# Patient Record
Sex: Male | Born: 1935 | Race: White | Hispanic: No | Smoking: Former smoker
Health system: Southern US, Community
[De-identification: ages and names within clinical notes are randomized; demographics above are authoritative.]

## PROBLEM LIST (undated history)

## (undated) DIAGNOSIS — M4310 Spondylolisthesis, site unspecified: Secondary | ICD-10-CM

## (undated) DIAGNOSIS — C61 Malignant neoplasm of prostate: Secondary | ICD-10-CM

## (undated) DIAGNOSIS — M1990 Unspecified osteoarthritis, unspecified site: Secondary | ICD-10-CM

## (undated) DIAGNOSIS — K635 Polyp of colon: Secondary | ICD-10-CM

## (undated) DIAGNOSIS — T883XXA Malignant hyperthermia due to anesthesia, initial encounter: Secondary | ICD-10-CM

## (undated) DIAGNOSIS — D3500 Benign neoplasm of unspecified adrenal gland: Secondary | ICD-10-CM

## (undated) DIAGNOSIS — K2970 Gastritis, unspecified, without bleeding: Principal | ICD-10-CM

## (undated) DIAGNOSIS — N400 Enlarged prostate without lower urinary tract symptoms: Secondary | ICD-10-CM

## (undated) DIAGNOSIS — R011 Cardiac murmur, unspecified: Secondary | ICD-10-CM

## (undated) DIAGNOSIS — I10 Essential (primary) hypertension: Secondary | ICD-10-CM

## (undated) DIAGNOSIS — I82409 Acute embolism and thrombosis of unspecified deep veins of unspecified lower extremity: Secondary | ICD-10-CM

## (undated) DIAGNOSIS — B9681 Helicobacter pylori [H. pylori] as the cause of diseases classified elsewhere: Principal | ICD-10-CM

## (undated) HISTORY — DX: Acute embolism and thrombosis of unspecified deep veins of unspecified lower extremity: I82.409

## (undated) HISTORY — DX: Malignant neoplasm of prostate: C61

## (undated) HISTORY — DX: Benign prostatic hyperplasia without lower urinary tract symptoms: N40.0

## (undated) HISTORY — DX: Polyp of colon: K63.5

## (undated) HISTORY — DX: Essential (primary) hypertension: I10

## (undated) HISTORY — DX: Malignant hyperthermia due to anesthesia, initial encounter: T88.3XXA

## (undated) HISTORY — DX: Benign neoplasm of unspecified adrenal gland: D35.00

## (undated) HISTORY — DX: Gastritis, unspecified, without bleeding: K29.70

## (undated) HISTORY — DX: Helicobacter pylori (H. pylori) as the cause of diseases classified elsewhere: B96.81

---
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
## 1977-10-29 HISTORY — PX: BACK SURGERY: SHX140

## 1994-10-29 HISTORY — PX: OTHER SURGICAL HISTORY: SHX169

## 1994-10-29 HISTORY — PX: KNEE SURGERY: SHX244

## 1995-10-30 HISTORY — PX: TRANSURETHRAL RESECTION OF PROSTATE: SHX73

## 2001-11-10 ENCOUNTER — Ambulatory Visit (HOSPITAL_COMMUNITY): Admission: RE | Admit: 2001-11-10 | Discharge: 2001-11-10 | Payer: Self-pay | Admitting: Urology

## 2001-11-10 ENCOUNTER — Encounter: Payer: Self-pay | Admitting: Urology

## 2002-07-28 ENCOUNTER — Ambulatory Visit (HOSPITAL_COMMUNITY): Admission: RE | Admit: 2002-07-28 | Discharge: 2002-07-28 | Payer: Self-pay | Admitting: Orthopaedic Surgery

## 2002-07-28 ENCOUNTER — Encounter: Payer: Self-pay | Admitting: Orthopaedic Surgery

## 2005-06-11 ENCOUNTER — Ambulatory Visit (HOSPITAL_COMMUNITY): Admission: RE | Admit: 2005-06-11 | Discharge: 2005-06-11 | Payer: Self-pay | Admitting: Orthopaedic Surgery

## 2005-10-29 HISTORY — PX: CORNEAL TRANSPLANT: SHX108

## 2005-10-29 HISTORY — PX: CATARACT EXTRACTION: SUR2

## 2006-06-07 ENCOUNTER — Ambulatory Visit (HOSPITAL_COMMUNITY): Admission: RE | Admit: 2006-06-07 | Discharge: 2006-06-07 | Payer: Self-pay | Admitting: Family Medicine

## 2007-04-24 ENCOUNTER — Ambulatory Visit: Admission: RE | Admit: 2007-04-24 | Discharge: 2007-07-23 | Payer: Self-pay | Admitting: Radiation Oncology

## 2007-06-19 ENCOUNTER — Encounter: Admission: RE | Admit: 2007-06-19 | Discharge: 2007-06-19 | Payer: Self-pay | Admitting: Urology

## 2007-07-11 ENCOUNTER — Ambulatory Visit (HOSPITAL_BASED_OUTPATIENT_CLINIC_OR_DEPARTMENT_OTHER): Admission: RE | Admit: 2007-07-11 | Discharge: 2007-07-11 | Payer: Self-pay | Admitting: Urology

## 2007-08-08 ENCOUNTER — Ambulatory Visit: Admission: RE | Admit: 2007-08-08 | Discharge: 2007-09-02 | Payer: Self-pay | Admitting: Radiation Oncology

## 2007-08-08 DIAGNOSIS — C61 Malignant neoplasm of prostate: Secondary | ICD-10-CM

## 2007-08-08 HISTORY — DX: Malignant neoplasm of prostate: C61

## 2008-05-13 ENCOUNTER — Ambulatory Visit (HOSPITAL_COMMUNITY): Admission: RE | Admit: 2008-05-13 | Discharge: 2008-05-13 | Payer: Self-pay | Admitting: *Deleted

## 2008-09-26 ENCOUNTER — Emergency Department (HOSPITAL_COMMUNITY): Admission: EM | Admit: 2008-09-26 | Discharge: 2008-09-26 | Payer: Self-pay | Admitting: Emergency Medicine

## 2008-09-26 ENCOUNTER — Emergency Department (HOSPITAL_COMMUNITY): Admission: EM | Admit: 2008-09-26 | Discharge: 2008-09-26 | Payer: Self-pay | Admitting: Family Medicine

## 2009-10-01 ENCOUNTER — Emergency Department (HOSPITAL_COMMUNITY): Admission: EM | Admit: 2009-10-01 | Discharge: 2009-10-01 | Payer: Self-pay | Admitting: Emergency Medicine

## 2009-11-28 ENCOUNTER — Ambulatory Visit (HOSPITAL_COMMUNITY): Admission: RE | Admit: 2009-11-28 | Discharge: 2009-11-28 | Payer: Self-pay | Admitting: Orthopaedic Surgery

## 2010-10-29 HISTORY — PX: COLONOSCOPY: SHX174

## 2010-11-20 ENCOUNTER — Encounter: Payer: Self-pay | Admitting: *Deleted

## 2011-03-13 NOTE — Op Note (Signed)
NAME:  Tracy Pratt, Tracy Pratt                ACCOUNT NO.:  [id number]   MEDICAL RECORD NO.:  [id number]          PATIENT TYPE:  AMB   LOCATION:  [facility]                         FACILITY:  [facility]   PHYSICIAN:  Mark C. Vernie Ammons, M.D.  DATE OF BIRTH:  1930/11/13   DATE OF PROCEDURE:  07/11/2007  DATE OF DISCHARGE:                               OPERATIVE REPORT   PREOPERATIVE DIAGNOSIS:  Adenocarcinoma of the prostate.   POSTOPERATIVE DIAGNOSIS:  Adenocarcinoma of the prostate.   PROCEDURE:  I-125 seed implant.   SURGEON:  Mark C. Vernie Ammons, M.D.   RADIATION ONCOLOGIST:  Artist Pais. Kathrynn Running, M.D.   ANESTHESIA:  Spinal.   DRAIN:  16 French Foley catheter.   SPECIMENS:  None.   BLOOD LOSS:  None.   COMPLICATIONS:  None.   INDICATIONS:  The patient is a 77 year old white male with biopsy proven  adenocarcinoma of the prostate, Gleason 7 (3+4) and a significant  portion of cores in the right mid prostate.  It used was found in  another portion of the prostate, as well, and his PSA was 5.47.  We  discussed all treatment options and he elected to proceed with  radioactive seed implant.  He understands the risks, complications, and  alternatives.   DESCRIPTION OF OPERATION:  After informed consent, the patient was  brought to the major OR and placed on the table, administered general  anesthesia, and an unofficial time out was performed.  A rectal probe as  well as a rectal tube was placed in the rectum.  A 16-French Foley  catheter was placed in the bladder with dilute contrast filling the  balloon and Dr. Kathrynn Running then performed real time ultrasonographic  planning.   Once the planning phase of the procedure was complete, I proceeded to  place the radioactive seeds according to the plan.  A total of 22  needles were used to place 61 seeds.  This portion of the operation was  well tolerated with no complication.  I then removed the Foley catheter,  rectal tube, and rectal probe.  The genitalia  was then reprepped and a  17 French flexible cystoscope was then used to perform cystoscopy.   The urethra was noted to be normal down to the sphincter which appears  intact.  The prostatic urethra was resected from his prior TURP and on  the floor of the bladder was a single radioactive seed that was grasped  and extracted.  I then reinserted the scope and noted to second seed  protruding from the wall of the prostatic urethra at about the 5 o'clock  position on the left hand side.  This was grasped, as well, and removed.  I then reinserted the scope a final time noting no further seeds  identified within the prostatic urethra.  No active bleeding was noted.  The bladder was then fully inspected and noted to be free of any tumor,  stones or inflammatory lesions.  The ureteral orifices were normal  configuration and position and retroflexion of the scope revealed no  seeds protruding from the base of the prostate.  The cystoscope was removed and a new 16 French Foley catheter was  placed.  The patient was then taken to the recovery room to be observed.  He has a history of malignant hyperthermia but received no  succinylcholine or inhalation agents and, therefore, once he is observed  in the recovery room and no elevation of his temperature is noted, he  will be discharged home.  He has had prior several surgeries in the past  without difficulty and his wife will monitor his temperature at home.  She said she monitored it every 30 minutes after his last surgery and I  have recommended she do that again.  He will be given a prescription for  500 mg of Cipro that he will take t.i.d. for five days and Vicodin ES,  #34.  He will follow up in my office in past three weeks for recheck and  will remove his catheter at home tomorrow morning.      Mark C. Vernie Ammons, M.D.  Electronically Signed     MCO/MEDQ  D:  07/11/2007  T:  07/12/2007  Job:  [id number]

## 2011-07-31 LAB — URINE CULTURE: Colony Count: >=100000

## 2011-07-31 LAB — URINALYSIS, ROUTINE W REFLEX MICROSCOPIC
Glucose, UA: NEGATIVE mg/dL
Specific Gravity, Urine: 1.021 (ref 1.005–1.030)
Urobilinogen, UA: 1 mg/dL (ref 0.0–1.0)

## 2011-07-31 LAB — URINE MICROSCOPIC-ADD ON

## 2011-07-31 LAB — POCT URINALYSIS DIP (DEVICE)
Bilirubin Urine: NEGATIVE
Glucose, UA: NEGATIVE mg/dL
Ketones, ur: 15 mg/dL — AB
Nitrite: NEGATIVE
pH: 6 (ref 5.0–8.0)

## 2011-08-10 LAB — CBC
HCT: 46.6
Hemoglobin: 16.1
MCHC: 34.6
MCV: 89.7
Platelets: 171
RBC: 5.2
RDW: 13.7
WBC: 5.9

## 2011-08-10 LAB — COMPREHENSIVE METABOLIC PANEL
ALT: 22
AST: 20
Albumin: 3.9
Alkaline Phosphatase: 50
BUN: 8
CO2: 29
Calcium: 8.9
Chloride: 103
Creatinine, Ser: 1.2
GFR calc Af Amer: >60
GFR calc non Af Amer: 59 — ABNORMAL LOW
Glucose, Bld: 98
Potassium: 4.3
Sodium: 139
Total Bilirubin: 1.1
Total Protein: 6.5

## 2011-08-10 LAB — PROTIME-INR
INR: 1
Prothrombin Time: 12.9

## 2011-08-10 LAB — APTT: aPTT: 29

## 2011-10-15 ENCOUNTER — Telehealth: Payer: Self-pay | Admitting: *Deleted

## 2011-10-29 ENCOUNTER — Encounter: Payer: Self-pay | Admitting: *Deleted

## 2011-10-29 DIAGNOSIS — T883XXA Malignant hyperthermia due to anesthesia, initial encounter: Secondary | ICD-10-CM | POA: Insufficient documentation

## 2011-10-29 DIAGNOSIS — N400 Enlarged prostate without lower urinary tract symptoms: Secondary | ICD-10-CM | POA: Insufficient documentation

## 2011-10-29 DIAGNOSIS — D3500 Benign neoplasm of unspecified adrenal gland: Secondary | ICD-10-CM | POA: Insufficient documentation

## 2011-10-31 ENCOUNTER — Ambulatory Visit
Admission: RE | Admit: 2011-10-31 | Discharge: 2011-10-31 | Disposition: A | Discharge status: Home / Self Care | Payer: Medicare Other | Source: Ambulatory Visit | Attending: Radiation Oncology | Admitting: Radiation Oncology

## 2011-10-31 ENCOUNTER — Telehealth: Payer: Self-pay | Admitting: *Deleted

## 2011-10-31 ENCOUNTER — Encounter: Payer: Self-pay | Admitting: Radiation Oncology

## 2011-10-31 VITALS — BP 155/91 | HR 80 | Temp 97.6°F | Resp 20 | Wt 214.5 lb

## 2011-10-31 DIAGNOSIS — C61 Malignant neoplasm of prostate: Secondary | ICD-10-CM

## 2011-10-31 DIAGNOSIS — E278 Other specified disorders of adrenal gland: Secondary | ICD-10-CM | POA: Diagnosis not present

## 2011-10-31 DIAGNOSIS — M4310 Spondylolisthesis, site unspecified: Secondary | ICD-10-CM | POA: Insufficient documentation

## 2011-10-31 DIAGNOSIS — Z79899 Other long term (current) drug therapy: Secondary | ICD-10-CM | POA: Diagnosis not present

## 2011-10-31 HISTORY — DX: Spondylolisthesis, site unspecified: M43.10

## 2011-10-31 HISTORY — DX: Unspecified osteoarthritis, unspecified site: M19.90

## 2011-10-31 NOTE — Progress Notes (Signed)
Radiation Oncology         [phone]) [phone] ________________________________  Name: Tracy Pratt MRN: [id number]  Date: 10/31/2011  DOB: 01/30/1931       Follow-Up Visit Note  CC:  Colette Ribas, MD  Ihor Gully MD    Diagnosis:   78 year old gentleman with stage TI C. adenocarcinoma prostate with a Gleason score 3+4 and PSA of 5.47  Interval Since Last Radiation:  4 years  Narrative . . . . . . . . The patient returns today for follow-up.  His PSA has been relatively well controlled with a most recent value of 0.09. However, he did develop a bout of hematuria prompting CT scan of the abdomen and pelvis. The studies that showed a soft tissue density along the right retroperitoneum posteriorly and the report suggested that this could in fact reflect a metastatic deposit. Given the patient's low PSA level and absence of other primary malignancy, he's been referred back to our office today to discuss potential diagnostic options to further evaluate this abnormality.              ALLERGIES:  is allergic to desflurane; enflurane; halothane; isoflurane; nsaids; sevoflurane; and succinylcholine.  Meds. . . . . . . . . . . . Current Outpatient Prescriptions  Medication Sig Dispense Refill  . HYDROcodone-acetaminophen (NORCO) 5-325 MG per tablet Take 1 tablet by mouth daily as needed.        Marland Kitchen lisinopril (PRINIVIL,ZESTRIL) 5 MG tablet Take 10 mg by mouth daily.       Marland Kitchen loteprednol (LOTEMAX) 0.5 % ophthalmic suspension Place 1 drop into both eyes 4 (four) times daily.         Physical Findings. . .  weight is 214 lb 8 oz (97.297 kg). His oral temperature is 97.6 F (36.4 C). His blood pressure is 155/91 and his pulse is 80. His respiration is 20. Marland Kitchen  No significant changes.  Lab Findings . . . . .  Lab Results  Component Value Date   WBC 5.9 07/04/2007   HGB 16.1 07/04/2007   HCT 46.6 07/04/2007   MCV 89.7 07/04/2007   PLT 171 07/04/2007   X-Ray Findings. . . . abdominal and pelvic CT were  performed at [facility] under a separate medical record.  Because this was a separate medical record, it was not compared with his previous CT of the chest from 2009 or his previous abdominal and pelvic CT performed in 2003 Diagnostic report text  *RADIOLOGY REPORT*  Clinical Data: Prostate cancer, gross hematuria.  CT ABDOMEN AND PELVIS WITHOUT AND WITH CONTRAST  Technique: Multidetector CT imaging of the abdomen and pelvis was performed without contrast material in one or both body regions, followed by contrast material(s) and further sections in one or both body regions.  Contrast: 125 ml Omnipaque  Comparison: None available  Findings:  Renal: No nephrolithiasis or ureterolithiasis. Cortical phase imaging demonstrates no enhancing renal cortical lesion. Delayed pyelogram phase imaging demonstrates no filling defects within the collecting systems or ureters.  Lung bases are clear. No pericardial fluid. No focal hepatic lesion. The gallbladder, pancreas, spleen, and adrenal glands are normal. Thickening of the lateral of the left adrenal gland is similar to prior report. The stomach, small bowel, and colon are normal.  Abdominal aorta normal caliber. No retroperitoneal lymphadenopathy.  There is a retroperitoneal nodule posterior to the upper pole of the right kidney measuring 13 mm (image 25).  The prostate gland contains a  brachytherapy seeds. No evidence of filling defects within the bladder. No pelvic lymphadenopathy Review of bone windows demonstrates no aggressive osseous lesions.  IMPRESSION:  1.. No explanation for hematuria. 2. Nodule within the right retroperitoneum adjacent to Morison's pouch. Cannot exclude a metastatic implant. 3. Stable enlargement lateral limb of the left adrenal gland by report.     Impression . . . . . . . The patient appears to be experiencing excellent biochemical control of his prostate cancer. He had spontaneous hematuria  which has resolved but a CT study performed in evaluation for the source of hematuria he was found to have an incidental 1.3 cm soft tissue nodule the retroperitoneum of uncertain clinical significance. His most recent CT has not been compared with older CT scans. Prior to proceeding with any additional imaging workup or biopsy, I have asked that his medical records be combined in the PACS system to provide comparison with older scans. On in formal review, his chest CT from July of 2009 does appear to show the upper age of a density in the retroperitoneum in the same location.  Plan . . . . . . . . . . . . I have requested that radiology compare his older scans. I also requested that our physics staff retrieve his radiation planning CT from 2008 to determine whether that image study said shows the upper retroperitoneum for comparison. The patient will return to radiation oncology clinic in 2 weeks to discuss the findings of these additional investigations.  _____________________________________  Artist Pais Kathrynn Running, M.D.

## 2011-10-31 NOTE — Progress Notes (Signed)
Recon follow up right retroperitoneal nodule   Hx 07/11/07 1-125 SEED iMPLANT ,GLEASON 3+4=7,PSA=5.47=61 SEEDS  MARRIED,   prostate Cancer 04/01/07     MARRIED, Daughter is a Advice worker ,     Allergies: NSAIDS,BEE STINGS,HALOTHANE SOLN

## 2011-10-31 NOTE — Telephone Encounter (Signed)
Pt stated last office visit was either Oct or November of this year, said his last PSA=.09, called and spoke with JUdy at Dr. Margrett Rud office requesting both,she will fax over 3:39 PM

## 2011-11-15 ENCOUNTER — Encounter: Payer: Self-pay | Admitting: Radiation Oncology

## 2011-11-15 ENCOUNTER — Telehealth: Payer: Self-pay | Admitting: *Deleted

## 2011-11-15 ENCOUNTER — Ambulatory Visit
Admission: RE | Admit: 2011-11-15 | Discharge: 2011-11-15 | Disposition: A | Discharge status: Home / Self Care | Payer: Medicare Other | Source: Ambulatory Visit | Attending: Radiation Oncology | Admitting: Radiation Oncology

## 2011-11-15 DIAGNOSIS — C61 Malignant neoplasm of prostate: Secondary | ICD-10-CM

## 2011-11-15 DIAGNOSIS — D3500 Benign neoplasm of unspecified adrenal gland: Secondary | ICD-10-CM

## 2011-11-15 NOTE — Progress Notes (Signed)
Pt denies any urinary issues. Pt here to fu w/dr about previous scan results.

## 2011-11-16 ENCOUNTER — Other Ambulatory Visit: Payer: Self-pay | Admitting: Radiation Oncology

## 2011-11-16 ENCOUNTER — Encounter: Payer: Self-pay | Admitting: Radiation Oncology

## 2011-11-16 DIAGNOSIS — C61 Malignant neoplasm of prostate: Secondary | ICD-10-CM

## 2011-11-16 NOTE — Progress Notes (Signed)
  Radiation Oncology         [phone] ________________________________  Name: Tracy Pratt MRN: [id number]  Date: 11/16/2011  DOB: Mar 23, 1931       Follow-Up Visit Note  CC:  Colette Ribas, MD  Diagnosis:   78 year old gentleman status post radiation treatment for prostate cancer with biochemical control  Interval Since Last Radiation:  4 years  Narrative . . . . . . . . The patient returns today for routine follow-up.  He had a CT scan performed [facility] which showed a soft tissue density in the right retroperitoneum. We reviewed that scan during his last visit to our office on January 2. In combining his electronic medical records, patient appears to also had a CT scan of the chest a few years ago showing the same finding. During the patient's visit to our clinic on January 2, I told him that I felt that the new soft tissue lesion in the right retroperitoneum was actually seen on his previous chest CT suggestive of old scar tissue. However, I told him that I would review all available previous CTs and review his case with radiology. He returns today to review the results of that investigation. Our planning CT for his prostate radiation was pulled up and reviewed. It did not show images high enough to reveal the right upper abnormal soft tissue mass.    I also reviewed this case with Dr. Hulan Amato of diagnostic radiology. He kindly had the patient's electronic records combined in the radiology system and compared to the patient's current abdominal CT with his older chest CT. We will also discussed the patient's biochemical control for prostate cancer and I provided some clinical background about his current status. Dr. Amil Amen felt that the soft tissue lesion seen on the current abdominal CT was present on the patient's previous chest CT and likely represents a benign focus of scar tissue. It is worth noting that the chest CT shows the upper age of the soft tissue lesion  in the right abdomen and does not show it in its entirety, limiting its certainty.                            Impression . . . . . . . The patient currently exhibits no evidence of recurrence of his prostate cancer. He does have a small soft tissue mass in the right upper abdomen. This may reflect old trauma.  Plan . . . . . . . . . . . . At this point, for the patient's peace of mind, I elected to order a CT scan of the abdomen on February 11th to provide a 3 month interim followup and he'll return to radiation oncology clinic in her eating office on for more 13th to review those results. If the new CT also documents stability of the finding, will not order any additional scans.  _____________________________________  Artist Pais Kathrynn Running, M.D.

## 2011-11-17 DIAGNOSIS — Z6830 Body mass index (BMI) 30.0-30.9, adult: Secondary | ICD-10-CM | POA: Diagnosis not present

## 2011-11-17 DIAGNOSIS — J209 Acute bronchitis, unspecified: Secondary | ICD-10-CM | POA: Diagnosis not present

## 2011-11-17 DIAGNOSIS — J069 Acute upper respiratory infection, unspecified: Secondary | ICD-10-CM | POA: Diagnosis not present

## 2011-12-04 ENCOUNTER — Encounter (HOSPITAL_COMMUNITY): Payer: Self-pay

## 2011-12-04 ENCOUNTER — Ambulatory Visit (HOSPITAL_COMMUNITY)
Admission: RE | Admit: 2011-12-04 | Discharge: 2011-12-04 | Disposition: A | Discharge status: Home / Self Care | Payer: Medicare Other | Source: Ambulatory Visit | Attending: Radiation Oncology | Admitting: Radiation Oncology

## 2011-12-04 ENCOUNTER — Other Ambulatory Visit: Payer: Self-pay | Admitting: Radiation Oncology

## 2011-12-04 DIAGNOSIS — C61 Malignant neoplasm of prostate: Secondary | ICD-10-CM | POA: Diagnosis not present

## 2011-12-04 DIAGNOSIS — R935 Abnormal findings on diagnostic imaging of other abdominal regions, including retroperitoneum: Secondary | ICD-10-CM | POA: Insufficient documentation

## 2011-12-04 DIAGNOSIS — Z09 Encounter for follow-up examination after completed treatment for conditions other than malignant neoplasm: Secondary | ICD-10-CM | POA: Insufficient documentation

## 2011-12-04 DIAGNOSIS — M7989 Other specified soft tissue disorders: Secondary | ICD-10-CM | POA: Diagnosis not present

## 2011-12-04 DIAGNOSIS — E278 Other specified disorders of adrenal gland: Secondary | ICD-10-CM | POA: Diagnosis not present

## 2011-12-04 LAB — CREATININE, SERUM
GFR calc Af Amer: 71 mL/min — ABNORMAL LOW (ref >90)
GFR calc non Af Amer: 61 mL/min — ABNORMAL LOW (ref >90)

## 2011-12-04 LAB — BUN: BUN: 13 mg/dL (ref 6–23)

## 2011-12-04 MED ORDER — IOHEXOL 300 MG/ML  SOLN
100.0000 mL | Freq: Once | INTRAMUSCULAR | Status: AC | PRN
  Administered 2011-12-04: 100 mL via INTRAVENOUS

## 2011-12-05 ENCOUNTER — Telehealth: Payer: Self-pay | Admitting: *Deleted

## 2011-12-05 NOTE — Progress Notes (Signed)
Quick Note:  Please call patient with normal result.  Thanks. MM ______ 

## 2011-12-05 NOTE — Progress Notes (Signed)
Will call pt,Tracy Pratt

## 2011-12-05 NOTE — Telephone Encounter (Signed)
Called pt home phone, [phone],left voice message, then called pts cell phone, pt answered and gave results normal ct abdomen  Per MD, pt asked if he still needed to come to [facility] in February for appt, will call pt back on his home phone per his request and leave a message on this status  after e-mailing Dr. Iona Coach thanked this RN for the good news 9:40 AM

## 2011-12-06 ENCOUNTER — Other Ambulatory Visit (HOSPITAL_COMMUNITY): Payer: Medicare Other

## 2011-12-12 DIAGNOSIS — Z8546 Personal history of malignant neoplasm of prostate: Secondary | ICD-10-CM | POA: Diagnosis not present

## 2011-12-12 DIAGNOSIS — R229 Localized swelling, mass and lump, unspecified: Secondary | ICD-10-CM | POA: Diagnosis not present

## 2011-12-12 DIAGNOSIS — Z09 Encounter for follow-up examination after completed treatment for conditions other than malignant neoplasm: Secondary | ICD-10-CM | POA: Diagnosis not present

## 2011-12-21 NOTE — Telephone Encounter (Signed)
xxx

## 2012-02-15 DIAGNOSIS — R7301 Impaired fasting glucose: Secondary | ICD-10-CM | POA: Diagnosis not present

## 2012-02-15 DIAGNOSIS — Z6830 Body mass index (BMI) 30.0-30.9, adult: Secondary | ICD-10-CM | POA: Diagnosis not present

## 2012-02-15 DIAGNOSIS — I10 Essential (primary) hypertension: Secondary | ICD-10-CM | POA: Diagnosis not present

## 2012-02-15 DIAGNOSIS — E785 Hyperlipidemia, unspecified: Secondary | ICD-10-CM | POA: Diagnosis not present

## 2012-02-18 DIAGNOSIS — I10 Essential (primary) hypertension: Secondary | ICD-10-CM | POA: Diagnosis not present

## 2012-02-18 DIAGNOSIS — R7301 Impaired fasting glucose: Secondary | ICD-10-CM | POA: Diagnosis not present

## 2012-02-18 DIAGNOSIS — E785 Hyperlipidemia, unspecified: Secondary | ICD-10-CM | POA: Diagnosis not present

## 2012-02-19 DIAGNOSIS — I10 Essential (primary) hypertension: Secondary | ICD-10-CM | POA: Diagnosis not present

## 2012-02-19 DIAGNOSIS — J069 Acute upper respiratory infection, unspecified: Secondary | ICD-10-CM | POA: Diagnosis not present

## 2012-02-19 DIAGNOSIS — Z6830 Body mass index (BMI) 30.0-30.9, adult: Secondary | ICD-10-CM | POA: Diagnosis not present

## 2012-03-18 NOTE — Progress Notes (Signed)
Encounter addended by: Lowella Petties, RN on: 03/18/2012 10:43 AM<BR>     Documentation filed: Charges VN

## 2012-03-25 DIAGNOSIS — R82998 Other abnormal findings in urine: Secondary | ICD-10-CM | POA: Diagnosis not present

## 2012-03-25 DIAGNOSIS — N419 Inflammatory disease of prostate, unspecified: Secondary | ICD-10-CM | POA: Diagnosis not present

## 2012-03-25 DIAGNOSIS — Z8546 Personal history of malignant neoplasm of prostate: Secondary | ICD-10-CM | POA: Diagnosis not present

## 2012-03-25 DIAGNOSIS — R310 Gross hematuria: Secondary | ICD-10-CM | POA: Diagnosis not present

## 2012-05-13 DIAGNOSIS — Z8546 Personal history of malignant neoplasm of prostate: Secondary | ICD-10-CM | POA: Diagnosis not present

## 2012-07-21 DIAGNOSIS — M25559 Pain in unspecified hip: Secondary | ICD-10-CM | POA: Diagnosis not present

## 2012-08-04 DIAGNOSIS — M25559 Pain in unspecified hip: Secondary | ICD-10-CM | POA: Diagnosis not present

## 2012-08-26 DIAGNOSIS — Z961 Presence of intraocular lens: Secondary | ICD-10-CM | POA: Diagnosis not present

## 2012-08-26 DIAGNOSIS — Z947 Corneal transplant status: Secondary | ICD-10-CM | POA: Diagnosis not present

## 2012-10-15 DIAGNOSIS — M25569 Pain in unspecified knee: Secondary | ICD-10-CM | POA: Diagnosis not present

## 2012-11-10 DIAGNOSIS — I10 Essential (primary) hypertension: Secondary | ICD-10-CM | POA: Diagnosis not present

## 2012-11-10 DIAGNOSIS — M25569 Pain in unspecified knee: Secondary | ICD-10-CM | POA: Diagnosis not present

## 2012-12-02 ENCOUNTER — Other Ambulatory Visit (HOSPITAL_COMMUNITY): Payer: Self-pay | Admitting: Family Medicine

## 2012-12-02 DIAGNOSIS — Z0000 Encounter for general adult medical examination without abnormal findings: Secondary | ICD-10-CM | POA: Diagnosis not present

## 2012-12-02 DIAGNOSIS — E785 Hyperlipidemia, unspecified: Secondary | ICD-10-CM | POA: Diagnosis not present

## 2012-12-02 DIAGNOSIS — R7301 Impaired fasting glucose: Secondary | ICD-10-CM | POA: Diagnosis not present

## 2012-12-02 DIAGNOSIS — Z6841 Body Mass Index (BMI) 40.0 and over, adult: Secondary | ICD-10-CM | POA: Diagnosis not present

## 2012-12-04 ENCOUNTER — Other Ambulatory Visit (HOSPITAL_COMMUNITY): Payer: Medicare Other

## 2012-12-05 ENCOUNTER — Ambulatory Visit (HOSPITAL_COMMUNITY)
Admission: RE | Admit: 2012-12-05 | Discharge: 2012-12-05 | Disposition: A | Discharge status: Home / Self Care | Payer: Medicare Other | Source: Ambulatory Visit | Attending: Family Medicine | Admitting: Family Medicine

## 2012-12-05 DIAGNOSIS — I6529 Occlusion and stenosis of unspecified carotid artery: Secondary | ICD-10-CM | POA: Diagnosis not present

## 2012-12-05 DIAGNOSIS — Z0000 Encounter for general adult medical examination without abnormal findings: Secondary | ICD-10-CM

## 2012-12-05 DIAGNOSIS — R0989 Other specified symptoms and signs involving the circulatory and respiratory systems: Secondary | ICD-10-CM | POA: Diagnosis not present

## 2012-12-05 DIAGNOSIS — I10 Essential (primary) hypertension: Secondary | ICD-10-CM | POA: Diagnosis not present

## 2012-12-22 DIAGNOSIS — L570 Actinic keratosis: Secondary | ICD-10-CM | POA: Diagnosis not present

## 2013-03-20 ENCOUNTER — Other Ambulatory Visit (HOSPITAL_COMMUNITY): Payer: Self-pay | Admitting: Family Medicine

## 2013-03-20 DIAGNOSIS — R1011 Right upper quadrant pain: Secondary | ICD-10-CM | POA: Diagnosis not present

## 2013-03-20 DIAGNOSIS — R160 Hepatomegaly, not elsewhere classified: Secondary | ICD-10-CM | POA: Diagnosis not present

## 2013-03-20 DIAGNOSIS — Z6831 Body mass index (BMI) 31.0-31.9, adult: Secondary | ICD-10-CM | POA: Diagnosis not present

## 2013-03-25 ENCOUNTER — Ambulatory Visit (HOSPITAL_COMMUNITY)
Admission: RE | Admit: 2013-03-25 | Discharge: 2013-03-25 | Disposition: A | Discharge status: Home / Self Care | Payer: Medicare Other | Source: Ambulatory Visit | Attending: Family Medicine | Admitting: Family Medicine

## 2013-03-25 DIAGNOSIS — K4090 Unilateral inguinal hernia, without obstruction or gangrene, not specified as recurrent: Secondary | ICD-10-CM | POA: Insufficient documentation

## 2013-03-25 DIAGNOSIS — I10 Essential (primary) hypertension: Secondary | ICD-10-CM | POA: Insufficient documentation

## 2013-03-25 DIAGNOSIS — Z8546 Personal history of malignant neoplasm of prostate: Secondary | ICD-10-CM | POA: Insufficient documentation

## 2013-03-25 DIAGNOSIS — R160 Hepatomegaly, not elsewhere classified: Secondary | ICD-10-CM

## 2013-03-25 DIAGNOSIS — R1011 Right upper quadrant pain: Secondary | ICD-10-CM

## 2013-03-25 MED ORDER — IOHEXOL 300 MG/ML  SOLN
100.0000 mL | Freq: Once | INTRAMUSCULAR | Status: AC | PRN
  Administered 2013-03-25: 100 mL via INTRAVENOUS

## 2013-04-04 DIAGNOSIS — K4090 Unilateral inguinal hernia, without obstruction or gangrene, not specified as recurrent: Secondary | ICD-10-CM | POA: Diagnosis not present

## 2013-04-04 DIAGNOSIS — I10 Essential (primary) hypertension: Secondary | ICD-10-CM | POA: Diagnosis not present

## 2013-04-04 DIAGNOSIS — Z6831 Body mass index (BMI) 31.0-31.9, adult: Secondary | ICD-10-CM | POA: Diagnosis not present

## 2013-04-13 ENCOUNTER — Ambulatory Visit (INDEPENDENT_AMBULATORY_CARE_PROVIDER_SITE_OTHER): Payer: Medicare Other | Admitting: Surgery

## 2013-05-19 DIAGNOSIS — N401 Enlarged prostate with lower urinary tract symptoms: Secondary | ICD-10-CM | POA: Diagnosis not present

## 2013-05-19 DIAGNOSIS — Z8546 Personal history of malignant neoplasm of prostate: Secondary | ICD-10-CM | POA: Diagnosis not present

## 2013-06-04 DIAGNOSIS — J019 Acute sinusitis, unspecified: Secondary | ICD-10-CM | POA: Diagnosis not present

## 2013-06-04 DIAGNOSIS — Z6831 Body mass index (BMI) 31.0-31.9, adult: Secondary | ICD-10-CM | POA: Diagnosis not present

## 2013-06-04 DIAGNOSIS — I10 Essential (primary) hypertension: Secondary | ICD-10-CM | POA: Diagnosis not present

## 2013-07-23 DIAGNOSIS — Z23 Encounter for immunization: Secondary | ICD-10-CM | POA: Diagnosis not present

## 2013-07-27 DIAGNOSIS — M25519 Pain in unspecified shoulder: Secondary | ICD-10-CM | POA: Diagnosis not present

## 2013-07-27 DIAGNOSIS — I10 Essential (primary) hypertension: Secondary | ICD-10-CM | POA: Diagnosis not present

## 2013-07-27 DIAGNOSIS — M79609 Pain in unspecified limb: Secondary | ICD-10-CM | POA: Diagnosis not present

## 2013-08-24 DIAGNOSIS — M898X9 Other specified disorders of bone, unspecified site: Secondary | ICD-10-CM | POA: Diagnosis not present

## 2013-08-24 DIAGNOSIS — M79609 Pain in unspecified limb: Secondary | ICD-10-CM | POA: Diagnosis not present

## 2013-08-24 DIAGNOSIS — I10 Essential (primary) hypertension: Secondary | ICD-10-CM | POA: Diagnosis not present

## 2013-08-24 DIAGNOSIS — M25519 Pain in unspecified shoulder: Secondary | ICD-10-CM | POA: Diagnosis not present

## 2013-09-01 DIAGNOSIS — H0289 Other specified disorders of eyelid: Secondary | ICD-10-CM | POA: Diagnosis not present

## 2013-10-05 DIAGNOSIS — M25579 Pain in unspecified ankle and joints of unspecified foot: Secondary | ICD-10-CM | POA: Diagnosis not present

## 2013-10-05 DIAGNOSIS — M25559 Pain in unspecified hip: Secondary | ICD-10-CM | POA: Diagnosis not present

## 2013-10-05 DIAGNOSIS — M545 Low back pain: Secondary | ICD-10-CM | POA: Diagnosis not present

## 2013-10-05 DIAGNOSIS — I10 Essential (primary) hypertension: Secondary | ICD-10-CM | POA: Diagnosis not present

## 2013-10-05 DIAGNOSIS — M79609 Pain in unspecified limb: Secondary | ICD-10-CM | POA: Diagnosis not present

## 2013-10-05 DIAGNOSIS — M25519 Pain in unspecified shoulder: Secondary | ICD-10-CM | POA: Diagnosis not present

## 2013-10-09 DIAGNOSIS — S93609A Unspecified sprain of unspecified foot, initial encounter: Secondary | ICD-10-CM | POA: Diagnosis not present

## 2013-10-09 DIAGNOSIS — M25579 Pain in unspecified ankle and joints of unspecified foot: Secondary | ICD-10-CM | POA: Diagnosis not present

## 2013-10-09 DIAGNOSIS — I10 Essential (primary) hypertension: Secondary | ICD-10-CM | POA: Diagnosis not present

## 2013-10-13 ENCOUNTER — Other Ambulatory Visit (HOSPITAL_COMMUNITY): Payer: Self-pay | Admitting: Orthopaedic Surgery

## 2013-10-13 DIAGNOSIS — R52 Pain, unspecified: Secondary | ICD-10-CM

## 2013-10-13 DIAGNOSIS — I10 Essential (primary) hypertension: Secondary | ICD-10-CM | POA: Diagnosis not present

## 2013-10-13 DIAGNOSIS — M25579 Pain in unspecified ankle and joints of unspecified foot: Secondary | ICD-10-CM | POA: Diagnosis not present

## 2013-10-19 ENCOUNTER — Ambulatory Visit (HOSPITAL_COMMUNITY)
Admission: RE | Admit: 2013-10-19 | Discharge: 2013-10-19 | Disposition: A | Discharge status: Home / Self Care | Payer: Medicare Other | Source: Ambulatory Visit | Attending: Orthopaedic Surgery | Admitting: Orthopaedic Surgery

## 2013-10-19 DIAGNOSIS — M259 Joint disorder, unspecified: Secondary | ICD-10-CM | POA: Diagnosis not present

## 2013-10-19 DIAGNOSIS — M79609 Pain in unspecified limb: Secondary | ICD-10-CM | POA: Diagnosis not present

## 2013-10-19 DIAGNOSIS — M1710 Unilateral primary osteoarthritis, unspecified knee: Secondary | ICD-10-CM | POA: Diagnosis not present

## 2013-10-19 DIAGNOSIS — R52 Pain, unspecified: Secondary | ICD-10-CM

## 2013-10-20 DIAGNOSIS — I10 Essential (primary) hypertension: Secondary | ICD-10-CM | POA: Diagnosis not present

## 2013-10-20 DIAGNOSIS — M25579 Pain in unspecified ankle and joints of unspecified foot: Secondary | ICD-10-CM | POA: Diagnosis not present

## 2013-10-29 HISTORY — PX: ROTATOR CUFF REPAIR: SHX139

## 2013-10-30 DIAGNOSIS — I10 Essential (primary) hypertension: Secondary | ICD-10-CM | POA: Diagnosis not present

## 2013-10-30 DIAGNOSIS — J0100 Acute maxillary sinusitis, unspecified: Secondary | ICD-10-CM | POA: Diagnosis not present

## 2013-10-30 DIAGNOSIS — Z6831 Body mass index (BMI) 31.0-31.9, adult: Secondary | ICD-10-CM | POA: Diagnosis not present

## 2013-11-13 DIAGNOSIS — M25579 Pain in unspecified ankle and joints of unspecified foot: Secondary | ICD-10-CM | POA: Diagnosis not present

## 2013-11-13 DIAGNOSIS — M25519 Pain in unspecified shoulder: Secondary | ICD-10-CM | POA: Diagnosis not present

## 2013-11-13 DIAGNOSIS — I10 Essential (primary) hypertension: Secondary | ICD-10-CM | POA: Diagnosis not present

## 2013-12-25 ENCOUNTER — Other Ambulatory Visit (HOSPITAL_COMMUNITY): Payer: Self-pay | Admitting: Orthopaedic Surgery

## 2013-12-25 DIAGNOSIS — M25512 Pain in left shoulder: Secondary | ICD-10-CM

## 2013-12-25 DIAGNOSIS — M25579 Pain in unspecified ankle and joints of unspecified foot: Secondary | ICD-10-CM | POA: Diagnosis not present

## 2013-12-25 DIAGNOSIS — M25519 Pain in unspecified shoulder: Secondary | ICD-10-CM | POA: Diagnosis not present

## 2013-12-25 DIAGNOSIS — I10 Essential (primary) hypertension: Secondary | ICD-10-CM | POA: Diagnosis not present

## 2013-12-29 ENCOUNTER — Other Ambulatory Visit (HOSPITAL_COMMUNITY): Payer: Self-pay | Admitting: General Practice

## 2013-12-30 ENCOUNTER — Ambulatory Visit (HOSPITAL_COMMUNITY)
Admission: RE | Admit: 2013-12-30 | Discharge: 2013-12-30 | Disposition: A | Discharge status: Home / Self Care | Payer: Medicare Other | Source: Ambulatory Visit | Attending: Orthopaedic Surgery | Admitting: Orthopaedic Surgery

## 2013-12-30 DIAGNOSIS — M25419 Effusion, unspecified shoulder: Secondary | ICD-10-CM | POA: Diagnosis not present

## 2013-12-30 DIAGNOSIS — Y33XXXA Other specified events, undetermined intent, initial encounter: Secondary | ICD-10-CM | POA: Insufficient documentation

## 2013-12-30 DIAGNOSIS — S46819A Strain of other muscles, fascia and tendons at shoulder and upper arm level, unspecified arm, initial encounter: Secondary | ICD-10-CM | POA: Diagnosis not present

## 2013-12-30 DIAGNOSIS — M67919 Unspecified disorder of synovium and tendon, unspecified shoulder: Secondary | ICD-10-CM | POA: Insufficient documentation

## 2013-12-30 DIAGNOSIS — M19019 Primary osteoarthritis, unspecified shoulder: Secondary | ICD-10-CM | POA: Insufficient documentation

## 2013-12-30 DIAGNOSIS — M719 Bursopathy, unspecified: Secondary | ICD-10-CM | POA: Diagnosis not present

## 2013-12-30 DIAGNOSIS — M25512 Pain in left shoulder: Secondary | ICD-10-CM

## 2014-01-01 DIAGNOSIS — M25519 Pain in unspecified shoulder: Secondary | ICD-10-CM | POA: Diagnosis not present

## 2014-01-01 DIAGNOSIS — I10 Essential (primary) hypertension: Secondary | ICD-10-CM | POA: Diagnosis not present

## 2014-01-01 DIAGNOSIS — S43429A Sprain of unspecified rotator cuff capsule, initial encounter: Secondary | ICD-10-CM | POA: Diagnosis not present

## 2014-01-01 DIAGNOSIS — T883XXA Malignant hyperthermia due to anesthesia, initial encounter: Secondary | ICD-10-CM | POA: Diagnosis not present

## 2014-01-01 DIAGNOSIS — M19019 Primary osteoarthritis, unspecified shoulder: Secondary | ICD-10-CM | POA: Diagnosis not present

## 2014-02-09 DIAGNOSIS — M75100 Unspecified rotator cuff tear or rupture of unspecified shoulder, not specified as traumatic: Secondary | ICD-10-CM | POA: Insufficient documentation

## 2014-02-09 DIAGNOSIS — S43429A Sprain of unspecified rotator cuff capsule, initial encounter: Secondary | ICD-10-CM | POA: Diagnosis not present

## 2014-02-22 DIAGNOSIS — L820 Inflamed seborrheic keratosis: Secondary | ICD-10-CM | POA: Diagnosis not present

## 2014-02-22 DIAGNOSIS — C44211 Basal cell carcinoma of skin of unspecified ear and external auricular canal: Secondary | ICD-10-CM | POA: Diagnosis not present

## 2014-02-22 DIAGNOSIS — L570 Actinic keratosis: Secondary | ICD-10-CM | POA: Diagnosis not present

## 2014-02-23 DIAGNOSIS — I498 Other specified cardiac arrhythmias: Secondary | ICD-10-CM | POA: Diagnosis not present

## 2014-02-23 DIAGNOSIS — I10 Essential (primary) hypertension: Secondary | ICD-10-CM | POA: Insufficient documentation

## 2014-02-23 DIAGNOSIS — Z01810 Encounter for preprocedural cardiovascular examination: Secondary | ICD-10-CM | POA: Diagnosis not present

## 2014-03-02 DIAGNOSIS — Z87891 Personal history of nicotine dependence: Secondary | ICD-10-CM | POA: Diagnosis not present

## 2014-03-02 DIAGNOSIS — I10 Essential (primary) hypertension: Secondary | ICD-10-CM | POA: Diagnosis not present

## 2014-03-02 DIAGNOSIS — Z888 Allergy status to other drugs, medicaments and biological substances status: Secondary | ICD-10-CM | POA: Diagnosis not present

## 2014-03-02 DIAGNOSIS — G8918 Other acute postprocedural pain: Secondary | ICD-10-CM | POA: Diagnosis not present

## 2014-03-02 DIAGNOSIS — S43429A Sprain of unspecified rotator cuff capsule, initial encounter: Secondary | ICD-10-CM | POA: Diagnosis not present

## 2014-03-02 DIAGNOSIS — M25819 Other specified joint disorders, unspecified shoulder: Secondary | ICD-10-CM | POA: Diagnosis not present

## 2014-03-02 DIAGNOSIS — Z8546 Personal history of malignant neoplasm of prostate: Secondary | ICD-10-CM | POA: Diagnosis not present

## 2014-03-11 DIAGNOSIS — Z6831 Body mass index (BMI) 31.0-31.9, adult: Secondary | ICD-10-CM | POA: Diagnosis not present

## 2014-03-11 DIAGNOSIS — R609 Edema, unspecified: Secondary | ICD-10-CM | POA: Diagnosis not present

## 2014-03-16 DIAGNOSIS — Z4789 Encounter for other orthopedic aftercare: Secondary | ICD-10-CM | POA: Diagnosis not present

## 2014-03-23 ENCOUNTER — Ambulatory Visit (HOSPITAL_COMMUNITY)
Admission: RE | Admit: 2014-03-23 | Discharge: 2014-03-23 | Disposition: A | Discharge status: Home / Self Care | Payer: Medicare Other | Source: Ambulatory Visit | Attending: Orthopedic Surgery | Admitting: Orthopedic Surgery

## 2014-03-23 DIAGNOSIS — M25519 Pain in unspecified shoulder: Secondary | ICD-10-CM | POA: Insufficient documentation

## 2014-03-23 DIAGNOSIS — IMO0001 Reserved for inherently not codable concepts without codable children: Secondary | ICD-10-CM | POA: Insufficient documentation

## 2014-03-23 DIAGNOSIS — M25612 Stiffness of left shoulder, not elsewhere classified: Secondary | ICD-10-CM | POA: Insufficient documentation

## 2014-03-23 DIAGNOSIS — M25619 Stiffness of unspecified shoulder, not elsewhere classified: Secondary | ICD-10-CM | POA: Diagnosis not present

## 2014-03-23 DIAGNOSIS — M6281 Muscle weakness (generalized): Secondary | ICD-10-CM | POA: Diagnosis not present

## 2014-03-23 NOTE — Evaluation (Addendum)
Occupational Therapy Evaluation  Patient Details  Name: Tracy Pratt MRN: [id number] Date of Birth: 1931/09/24  Today's Date: 03/23/2014 Time: 6237-6283 OT Time Calculation (min): 40 min OT eval 1305-1310 5' MFR 1517-6160 15' Pt education [phone] 20'  Visit#: 1 of 24  Re-eval: 04/20/14  Assessment Diagnosis: s/p left rotator cuff tear repair Surgical Date: 03/02/14 Next MD Visit: 04/19/2014 - Dr. Eden Lathe Prior Therapy: 9 years ago. Rotator cuff pain  Authorization: Medicare  Authorization Time Period: before 10th visit  Authorization Visit#: 1 of 10   Past Medical History:  Past Medical History  Diagnosis Date  . BPH (benign prostatic hyperplasia)   . Malignant hyperthermia   . Adrenal adenoma     left  . Allergy   . Cataract 2007&2009    both   . Arthritis     back,hip  . Hypertension   . Spondylolisthesis     l5-s1  . Prostate cancer 08/08/2007    seed implant and external rad tx   Past Surgical History:  Past Surgical History  Procedure Laterality Date  . Transurethral resection of prostate  1997  . Cataract extraction  2007  . Corneal transplant  2007  . Knee surgery  1996    left knee arthoscopic  . Back surgery  1979  . Torn meniscus left knee  1996    Subjective Symptoms/Limitations Symptoms: S: I had therapy on this shoulder probably 9 years ago and then it wasn't bad enough to have surgery. Pertinent History: 03/02/14 Mr. Sharp had rotator cuff repair and bone spurs removed from left shoulder. Patient states that he had therapy approx. 9 years ago on shoulder when it wasn't bad enough to have surgery. Patient is to wear sling 24/7 and is allowed to remove for bathing and dressing. Dr. Kenton Kingfisher has referred patient to occupational therapy for evaluation and treatment,. Patient Stated Goals: To get back to playinig with grandkids and volunteering at food pantry. Pain Assessment Currently in Pain?: No/denies (3/10 when pain does  bother.)  Precautions/Restrictions  Precautions Precautions: Shoulder Type of Shoulder Precautions: PROM for 2 weeks, start AAROM on 04/06/14, AROM may begin on 04/20/14.   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 [employer] ADL/Vision/Perception ADL ADL Comments: getting dressed/ bathing, using left arm Dominant Hand: Right Vision - History Baseline Vision: No visual deficits  Cognition/Observation Cognition Overall Cognitive Status: Within Functional Limits for tasks assessed  Sensation/Coordination/Edema    Additional Assessments LUE PROM (degrees) Left Shoulder Flexion: 41 Degrees Left Shoulder ABduction: 71 Degrees Left Shoulder Internal Rotation: 75 Degrees Left Shoulder External Rotation: 10 Degrees LUE Strength LUE Overall Strength Comments: unable to assess strength due to protocol. Palpation Palpation: max fascial restrictions      Exercise/Treatments    Manual Therapy Manual Therapy: Myofascial release Myofascial Release: MFR and manual stretching to left upper arm, trapezius, and scapularis region to decrease fascial restrictions and increase joint mobility in a pain free zone.  Occupational Therapy Assessment and Plan OT Assessment and Plan Clinical Impression Statement: A: Patient is a 80 y/o male s/p left shoulder rotator cuff repair with increased pain,  fascial restrictions, and decreased joint mobility causing difficulty completing B/IADL and leisure tasks. Pt will benefit from skilled therapeutic  intervention in order to improve on the following deficits: Increased fascial restricitons;Pain;Impaired UE functional use;Decreased strength;Decreased range of motion Rehab Potential: Excellent OT Frequency: Min 2X/week OT Duration: 12 weeks OT Treatment/Interventions: Self-care/ADL training;Therapeutic exercise;Modalities;Therapeutic activities;Manual therapy;Patient/family education;DME and/or AE instruction OT Plan: P:Pt will benefit from skilled OT interventions in order to decrease pain, increase ROM, increase strength, and increase overall LUE functional use. Treatment Plan: PROM, AAROM, and AROM, MFR and manual stretching, scapular strengtheing and proximal stabilization, LUE general strengthening - with protocol    Goals Short Term Goals Time to Complete Short Term Goals: 6 weeks Short Term Goal 1: Patient will be educated on HEP. Short Term Goal 2: Patient will report a pain level of 3/10 with daily tasks. Short Term Goal 3: Patient will increase PROM to WNL to increase ability to get dressed. 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 Short Term Goal 5: Patient will decrease fascial restrictions to mod amount. Long Term Goals Time to Complete Long Term Goals: 12 weeks Long Term Goal 1: Patient will return to highest level of independence with all B/IADL and leisure activities.  Long Term Goal 2: Patient will report a pain score of 2/10 or less with daily tasks. 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 Long Term Goal 4: Patient will increase left shoulder strength to 4-/5 to increase ability to lift boxes of light food at food pantry. Long Term Goal 5: Patient will decrease amount of fascial restrictions in min amount.  Problem List Patient Active Problem List   Diagnosis Date Noted  . Pain in joint, shoulder  region 03/23/2014  . Muscle weakness (generalized) 03/23/2014  . Decreased range of motion of left shoulder 03/23/2014  . Prostate cancer 10/31/2011  . Spondylolisthesis   . BPH (benign prostatic hyperplasia)   . Malignant hyperthermia   . Adrenal adenoma     End of Session Activity Tolerance: Patient tolerated treatment well General Behavior During Therapy: WFL for tasks assessed/performed OT Plan of Care OT Home Exercise Plan: pendulum and towel exercises (flexion only) OT Patient Instructions: handout (scanned) Consulted and Agree with Plan of Care: Patient  GO Functional Assessment Tool Used: FOTO score: 0/100 (100% impaired) Functional Limitation: Carrying, moving and handling objects Carrying, Moving and Handling Objects Current Status (W4315): 100 percent impaired, limited or restricted Carrying, Moving and Handling Objects Goal Status (Q0086): At least 20 percent but less than 40 percent impaired, limited or restricted  [name], OTR/L,CBIS   03/23/2014, 2:48 PM  Physician Documentation Your signature is required to indicate approval of the treatment plan as stated above.  Please sign and either send electronically or make a copy of this report for your files and return this physician signed original.  Please mark one 1.__approve of plan  2. ___approve of plan with the following conditions.   ______________________________                                                          _____________________ Physician Signature  Date  

## 2014-03-25 ENCOUNTER — Ambulatory Visit (HOSPITAL_COMMUNITY)
Admission: RE | Admit: 2014-03-25 | Discharge: 2014-03-25 | Disposition: A | Discharge status: Home / Self Care | Payer: Medicare Other | Source: Ambulatory Visit | Attending: Family Medicine | Admitting: Family Medicine

## 2014-03-25 NOTE — Progress Notes (Signed)
Occupational Therapy Treatment Patient Details  Name: Tracy Pratt MRN: [id number] Date of Birth: July 09, 1931  Today's Date: 03/25/2014 Time: 1751-0258 OT Time Calculation (min): 45 min MFR 5277-8242 14' Therex 3536-1443 31'  Visit#: 2 of 24  Re-eval: 04/20/14    Authorization: Medicare  Authorization Time Period: before 10th visit  Authorization Visit#: 2 of 10  Subjective Symptoms/Limitations Symptoms: S: It feels good to have my shoulder just hang down. Pain Assessment Currently in Pain?: Yes (with movement) Pain Score: 3  Pain Location: Shoulder Pain Orientation: Left Pain Type: Acute pain  Precautions/Restrictions  Precautions Precautions: Shoulder Type of Shoulder Precautions: PROM for 2 weeks, start AAROM on 04/06/14, AROM may begin on 04/20/14.   Shoulder Interventions: Shoulder sling/immobilizer  Exercise/Treatments Supine Protraction: PROM;10 reps Horizontal ABduction: PROM;10 reps External Rotation: PROM;10 reps Internal Rotation: PROM;10 reps Flexion: PROM;10 reps ABduction: PROM;10 reps Other Supine Exercises: 10X elbow flexion/extension, pronation/supination, wrist flexion/extension Seated Elevation: AROM;15 reps Extension: AROM;15 reps Row: AROM;15 reps Therapy Ball Flexion: 15 reps ABduction: 15 reps     Manual Therapy Manual Therapy: Myofascial release Myofascial Release: MFR and manual stretching to left upper arm, trapezius, and scapularis region to decrease fascial restrictions and increase joint mobility in a pain free zone.  Occupational Therapy Assessment and Plan OT Assessment and Plan Clinical Impression Statement: A: Decreased joint mobility during ER passive stretching. Patient tolerated well. Some pain with passive stretching. Patient had good form with all exercises. OT Plan: P: Add isometrics.    Goals Short Term Goals Time to Complete Short Term Goals: 6 weeks Short Term Goal 1: Patient will be educated on HEP. Short Term  Goal 1 Progress: Progressing toward goal Short Term Goal 2: Patient will report a pain level of 3/10 with daily tasks. Short Term Goal 2 Progress: Progressing toward goal Short Term Goal 3: Patient will increase PROM to WNL to increase ability to get dressed. 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 Short Term Goal 4 Progress: Progressing toward goal Short Term Goal 5: Patient will decrease fascial restrictions to mod amount. Short Term Goal 5 Progress: Progressing toward goal Long Term Goals Time to Complete Long Term Goals: 12 weeks Long Term Goal 1: Patient will return to highest level of independence with all B/IADL and leisure activities.  Long Term Goal 1 Progress: Progressing toward goal Long Term Goal 2: Patient will report a pain score of 2/10 or less with daily tasks. 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 Long Term Goal 4 Progress: Progressing toward goal Long Term Goal 5: Patient will decrease amount of fascial restrictions in min amount. Long Term Goal 5 Progress: Progressing toward goal  Problem List Patient Active Problem List   Diagnosis Date Noted  . Pain in joint, shoulder region 03/23/2014  . Muscle weakness (generalized) 03/23/2014  . Decreased range of motion of left shoulder 03/23/2014  . Prostate cancer 10/31/2011  . Spondylolisthesis   . BPH (benign prostatic hyperplasia)   . Malignant hyperthermia   . Adrenal adenoma  End of Session Activity Tolerance: Patient tolerated treatment  well General Behavior During Therapy: [facility] for tasks assessed/performed   [name], OTR/L,CBIS   03/25/2014, 11:45 AM

## 2014-03-29 ENCOUNTER — Ambulatory Visit (HOSPITAL_COMMUNITY)
Admission: RE | Admit: 2014-03-29 | Discharge: 2014-03-29 | Disposition: A | Discharge status: Home / Self Care | Payer: Medicare Other | Source: Ambulatory Visit | Attending: Family Medicine | Admitting: Family Medicine

## 2014-03-29 ENCOUNTER — Ambulatory Visit (HOSPITAL_COMMUNITY)
Admission: RE | Admit: 2014-03-29 | Payer: Medicare Other | Source: Ambulatory Visit | Attending: Family Medicine | Admitting: Family Medicine

## 2014-03-29 DIAGNOSIS — IMO0001 Reserved for inherently not codable concepts without codable children: Secondary | ICD-10-CM | POA: Insufficient documentation

## 2014-03-29 DIAGNOSIS — M6281 Muscle weakness (generalized): Secondary | ICD-10-CM | POA: Diagnosis not present

## 2014-03-29 DIAGNOSIS — M25619 Stiffness of unspecified shoulder, not elsewhere classified: Secondary | ICD-10-CM | POA: Diagnosis not present

## 2014-03-29 DIAGNOSIS — M25519 Pain in unspecified shoulder: Secondary | ICD-10-CM | POA: Diagnosis not present

## 2014-03-29 NOTE — Progress Notes (Signed)
Occupational Therapy Treatment Patient Details  Name: Tracy Pratt MRN: [id number] Date of Birth: 23-May-1931  Today's Date: 03/29/2014 Time: 3151-7616 OT Time Calculation (min): 49 min MFR 0737-1062 12' Therex 6948-5462  67'  Visit#: 3 of 24  Re-eval: 04/20/14    Authorization: Medicare  Authorization Time Period: before 10th visit  Authorization Visit#: 3 of 10  Subjective Symptoms/Limitations Symptoms: S: I've been doing those exercises on the table. I'm still really careful about moving my shoulder.   Precautions/Restrictions  Precautions Precautions: Shoulder Type of Shoulder Precautions: PROM for 2 weeks, start AAROM on 04/06/14, AROM may begin on 04/20/14.   Shoulder Interventions: Shoulder sling/immobilizer  Exercise/Treatments Supine Protraction: PROM;10 reps Horizontal ABduction: PROM;10 reps External Rotation: PROM;10 reps Internal Rotation: PROM;10 reps Flexion: PROM;10 reps ABduction: PROM;10 reps Other Supine Exercises: 10X elbow flexion/extension, pronation/supination, wrist flexion/extension Seated Elevation: AROM;15 reps Extension: AROM;15 reps Row: AROM;15 reps Therapy Ball Flexion: 15 reps ABduction: 15 reps ROM / Strengthening / Isometric Strengthening   Flexion: 3X3" Extension: 3X3" External Rotation: 3X3" Internal Rotation: 3X3" ABduction: 3X3" ADduction: 3X3"      Manual Therapy Myofascial Release: Myofascial release and manual stretching to left upper arm, trapezius, and scapularis region to decrease fascial restrictions and increase joint mobility in a pain free zone  Occupational Therapy Assessment and Plan OT Assessment and Plan Clinical Impression Statement: A: Pt reports limited soreness this date. Added isometrics and patient completed without difficulty.  OT Plan: P: Increase isometrics to 5x5   Goals Short Term Goals Time to Complete Short Term Goals: 6 weeks Short Term Goal 1: Patient will be educated on HEP. Short  Term Goal 1 Progress: Progressing toward goal Short Term Goal 2: Patient will report a pain level of 3/10 with daily tasks. Short Term Goal 2 Progress: Progressing toward goal Short Term Goal 3: Patient will increase PROM to WNL to increase ability to get dressed. 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 Short Term Goal 4 Progress: Progressing toward goal Short Term Goal 5: Patient will decrease fascial restrictions to mod amount. Short Term Goal 5 Progress: Progressing toward goal Long Term Goals Time to Complete Long Term Goals: 12 weeks Long Term Goal 1: Patient will return to highest level of independence with all B/IADL and leisure activities.  Long Term Goal 1 Progress: Progressing toward goal Long Term Goal 2: Patient will report a pain score of 2/10 or less with daily tasks. 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 Long Term Goal 4 Progress: Progressing toward goal Long Term Goal 5: Patient will decrease amount of fascial restrictions in min amount. Long Term Goal 5 Progress: Progressing toward goal  Problem List Patient Active Problem List   Diagnosis Date Noted  . Pain in joint, shoulder region 03/23/2014  . Muscle weakness (generalized) 03/23/2014  . Decreased range of motion of left shoulder 03/23/2014  . Prostate cancer 10/31/2011  . Spondylolisthesis   . BPH (benign prostatic hyperplasia)   . Malignant hyperthermia   . Adrenal adenoma     End  of Session Activity Tolerance: Patient tolerated treatment  well General Behavior During Therapy: [facility] for tasks assessed/performed   [name], OTR/L,CBIS   03/29/2014, 2:05 PM

## 2014-03-31 ENCOUNTER — Ambulatory Visit (HOSPITAL_COMMUNITY)
Admission: RE | Admit: 2014-03-31 | Discharge: 2014-03-31 | Disposition: A | Discharge status: Home / Self Care | Payer: Medicare Other | Source: Ambulatory Visit | Attending: Family Medicine | Admitting: Family Medicine

## 2014-03-31 DIAGNOSIS — IMO0001 Reserved for inherently not codable concepts without codable children: Secondary | ICD-10-CM | POA: Diagnosis not present

## 2014-03-31 NOTE — Progress Notes (Signed)
Occupational Therapy Treatment Patient Details  Name: Tracy Pratt MRN: [id number] Date of Birth: 1930/11/30  Today's Date: 03/31/2014 Time: 0258-5277 OT Time Calculation (min): 50 min Manual 1305-1325 (20') Therapeutic Exercises 1325-1355 (64')  Visit#: 4 of 24  Re-eval: 04/20/14    Authorization: Medicare  Authorization Time Period: before 10th visit  Authorization Visit#: 4 of 10  Subjective Symptoms/Limitations Symptoms: "I'm not taking any pain meds." Pain Assessment Currently in Pain?: Yes Pain Score: 3  Pain Location: Shoulder Pain Orientation: Left Pain Type: Acute pain  Precautions/Restrictions     Exercise/Treatments Supine Protraction: PROM;10 reps Horizontal ABduction: PROM;10 reps External Rotation: PROM;10 reps Internal Rotation: PROM;10 reps Flexion: PROM;10 reps ABduction: PROM;10 reps Other Supine Exercises: 10X elbow flexion/extension, pronation/supination, wrist flexion/extension AROM Seated Elevation: AROM;15 reps Extension: AROM;15 reps Row: AROM;15 reps ROM / Strengthening / Isometric Strengthening   Flexion: 5X5" Extension: 5X5" External Rotation: 5X5" Internal Rotation: 5X5" ABduction: 5X5" ADduction: 5X5"    Manual Therapy Manual Therapy: Myofascial release Myofascial Release: Myofascial release and manual stretching to left upper arm, trapezius, and scapularis region to decrease fascial restrictions and increase joint mobility in a pain free zone.    Occupational Therapy Assessment and Plan OT Assessment and Plan Clinical Impression Statement: Increased isometric hold and repitition time this session - pt had good tolerance.  Pt had some mention of 'movement' in shoulder, but no increases in pain.  Ball stretches no complete due to timing. OT Plan: Continue ball stretches.   Goals Short Term Goals Short Term Goal 1: Patient will be educated on HEP. Short Term Goal 1 Progress: Progressing toward goal Short Term Goal 2:  Patient will report a pain level of 3/10 with daily tasks. Short Term Goal 2 Progress: Progressing toward goal Short Term Goal 3: Patient will increase PROM to WNL to increase ability to get dressed. 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 Short Term Goal 4 Progress: Progressing toward goal Short Term Goal 5: Patient will decrease fascial restrictions to mod amount. Short Term Goal 5 Progress: Progressing toward goal Long Term Goals Long Term Goal 1: Patient will return to highest level of independence with all B/IADL and leisure activities.  Long Term Goal 1 Progress: Progressing toward goal Long Term Goal 2: Patient will report a pain score of 2/10 or less with daily tasks. 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 Long Term Goal 4 Progress: Progressing toward goal Long Term Goal 5: Patient will decrease amount of fascial restrictions in min amount. Long Term Goal 5 Progress: Progressing toward goal  Problem List Patient Active Problem List   Diagnosis Date Noted  . Pain in joint, shoulder region 03/23/2014  . Muscle weakness (generalized) 03/23/2014  . Decreased range of motion of left shoulder 03/23/2014  . Prostate cancer 10/31/2011  . Spondylolisthesis   . BPH (benign prostatic hyperplasia)   . Malignant hyperthermia   . Adrenal adenoma     End of Session Activity Tolerance: Patient tolerated treatment well General Behavior During Therapy: [facility] for tasks assessed/performed  [place], MS, OTR/L [phone]  03/31/2014, 1:54 PM

## 2014-04-05 ENCOUNTER — Ambulatory Visit (HOSPITAL_COMMUNITY): Payer: Medicare Other

## 2014-04-07 ENCOUNTER — Ambulatory Visit (HOSPITAL_COMMUNITY)
Admission: RE | Admit: 2014-04-07 | Discharge: 2014-04-07 | Disposition: A | Discharge status: Home / Self Care | Payer: Medicare Other | Source: Ambulatory Visit | Attending: Family Medicine | Admitting: Family Medicine

## 2014-04-07 DIAGNOSIS — IMO0001 Reserved for inherently not codable concepts without codable children: Secondary | ICD-10-CM | POA: Diagnosis not present

## 2014-04-07 NOTE — Progress Notes (Signed)
Occupational Therapy Treatment Patient Details  Name: Tracy Pratt MRN: [id number] Date of Birth: [birth year]  Today's Date: 04/07/2014 Time: 1105-1150 OT Time Calculation (min): 45 min MFR 0865-7846 12' Therex 9629-5284 33'  Visit#: 5 of 24  Re-eval: 04/20/14    Authorization: Medicare  Authorization Time Period: before 10th visit  Authorization Visit#: 5 of 10  Subjective Symptoms/Limitations Symptoms: S: I had my daughter call the Dr and he said when you start having me move my arm by myself I can stop wearing the sling Pain Assessment Currently in Pain?: No/denies  Precautions/Restrictions  Precautions Precautions: Shoulder Type of Shoulder Precautions: PROM for 2 weeks, start AAROM on 04/06/14, AROM may begin on 04/20/14.   Shoulder Interventions: Shoulder sling/immobilizer  Exercise/Treatments Supine Protraction: PROM;AAROM;10 reps Horizontal ABduction: PROM;AAROM;10 reps External Rotation: PROM;AAROM;10 reps Internal Rotation: PROM;AAROM;10 reps Flexion: PROM;AAROM;10 reps ABduction: PROM;AAROM;10 reps Other Supine Exercises: 10X elbow flexion/extension Therapy Ball Flexion: 15 reps ABduction: 15 reps    Manual Therapy Manual Therapy: Myofascial release Myofascial Release: Myofascial release and manual stretching to left upper arm, trapezius, and scapularis region to decrease fascial restrictions and increase joint mobility in a pain free zone.  Occupational Therapy Assessment and Plan OT Assessment and Plan Clinical Impression Statement: A: Attempted AAROM supine this date as patient is clear to progress. Patient completed exercises well with increased time.  OT Plan: P: Cont with AAROM supine and attempt pulleys   Goals Short Term Goals Time to Complete Short Term Goals: 6 weeks Short Term Goal 1: Patient will be educated on HEP. Short Term Goal 1 Progress: Progressing toward goal Short Term Goal 2: Patient will report a pain level of 3/10 with daily  tasks. Short Term Goal 2 Progress: Progressing toward goal Short Term Goal 3: Patient will increase PROM to WNL to increase ability to get dressed. 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 Short Term Goal 4 Progress: Progressing toward goal Short Term Goal 5: Patient will decrease fascial restrictions to mod amount. Short Term Goal 5 Progress: Progressing toward goal Long Term Goals Time to Complete Long Term Goals: 12 weeks Long Term Goal 1: Patient will return to highest level of independence with all B/IADL and leisure activities.  Long Term Goal 1 Progress: Progressing toward goal Long Term Goal 2: Patient will report a pain score of 2/10 or less with daily tasks. 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 Long Term Goal 4 Progress: Progressing toward goal Long Term Goal 5: Patient will decrease amount of fascial restrictions in min amount. Long Term Goal 5 Progress: Progressing toward goal  Problem List Patient Active Problem List   Diagnosis Date Noted  . Pain in joint, shoulder region 03/23/2014  . Muscle weakness (generalized) 03/23/2014  . Decreased range of motion of left shoulder 03/23/2014  . Prostate cancer 10/31/2011  . Spondylolisthesis   . BPH (benign prostatic hyperplasia)   . Malignant hyperthermia   . Adrenal adenoma     End of Session Activity Tolerance: Patient tolerated treatment well General Behavior During  Therapy: [facility] for tasks assessed/performed  [name], OTR/L,CBIS   04/07/2014, 11:48 AM

## 2014-04-09 ENCOUNTER — Ambulatory Visit (HOSPITAL_COMMUNITY): Payer: Medicare Other

## 2014-04-12 ENCOUNTER — Ambulatory Visit (HOSPITAL_COMMUNITY)
Admission: RE | Admit: 2014-04-12 | Discharge: 2014-04-12 | Disposition: A | Discharge status: Home / Self Care | Payer: Medicare Other | Source: Ambulatory Visit | Attending: Family Medicine | Admitting: Family Medicine

## 2014-04-12 DIAGNOSIS — IMO0001 Reserved for inherently not codable concepts without codable children: Secondary | ICD-10-CM | POA: Diagnosis not present

## 2014-04-12 NOTE — Progress Notes (Signed)
Occupational Therapy Treatment Patient Details  Name: Tracy Pratt MRN: [id number] Date of Birth: 1931/01/23  Today's Date: 04/12/2014 Time: 4098-1191 OT Time Calculation (min): 45 min Manual 1304-1330 (39') Therapeutic Exercises 4782-9562 (35')  Visit#: 6 of 24  Re-eval: 04/20/14    Authorization: Medicare  Authorization Time Period: before 10th visit  Authorization Visit#: 6 of 10  Subjective Symptoms/Limitations Symptoms: "Its just sore, and it depends on how far I move it." Pain Assessment Currently in Pain?: Yes Pain Score: 2  Pain Location: Shoulder Pain Orientation: Left Pain Type: Acute pain  Precautions/Restrictions     Exercise/Treatments Supine Protraction: PROM;AAROM;10 reps Horizontal ABduction: PROM;AAROM;10 reps External Rotation: PROM;AAROM;10 reps Internal Rotation: PROM;AAROM;10 reps Flexion: PROM;AAROM;10 reps ABduction: PROM;AAROM;10 reps Seated Elevation: AROM;15 reps Extension: AROM;15 reps Row: AROM;15 reps   Manual Therapy Manual Therapy: Myofascial release Myofascial Release: Myofascial release and manual stretching to left upper arm, trapezius, and scapularis region to decrease fascial restrictions and increase joint mobility in a pain free zone.    Occupational Therapy Assessment and Plan OT Assessment and Plan Clinical Impression Statement: A: Pt with some noted edema in left neck and upper shoulder area.  OTR suggested use of ice and pt stated he would discuss with MD at next appointment.  Continued supine AAROM reps - pt still requiring some extra time to complete reps.  Did not add pulleys this session due to extra time needed for exercises. OT Plan: Continue supine PROM and AAROM.  Attempt pulleys.  Follow up on use of ice on left side of neck edema.  Add cervical nexk stretches for tightness PRN.   Goals Short Term Goals Short Term Goal 1: Patient will be educated on HEP. Short Term Goal 1 Progress: Progressing toward  goal Short Term Goal 2: Patient will report a pain level of 3/10 with daily tasks. Short Term Goal 2 Progress: Progressing toward goal Short Term Goal 3: Patient will increase PROM to WNL to increase ability to get dressed. 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 Short Term Goal 4 Progress: Progressing toward goal Short Term Goal 5: Patient will decrease fascial restrictions to mod amount. Short Term Goal 5 Progress: Progressing toward goal Long Term Goals Long Term Goal 1: Patient will return to highest level of independence with all B/IADL and leisure activities.  Long Term Goal 1 Progress: Progressing toward goal Long Term Goal 2: Patient will report a pain score of 2/10 or less with daily tasks. 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 Long Term Goal 4 Progress: Progressing toward goal Long Term Goal 5: Patient will decrease amount of fascial restrictions in min amount. Long Term Goal 5 Progress: Progressing toward goal  Problem List Patient Active Problem List   Diagnosis Date Noted  . Pain in joint, shoulder region 03/23/2014  . Muscle weakness (generalized) 03/23/2014  . Decreased range of motion of left shoulder 03/23/2014  . Prostate cancer 10/31/2011  . Spondylolisthesis   . BPH (benign prostatic hyperplasia)   . Malignant hyperthermia   . Adrenal  adenoma     End of Session Activity Tolerance: Patient tolerated treatment well General Behavior During Therapy: [facility] for tasks assessed/performed  GO    Bea Graff, MS, OTR/L [phone]  04/12/2014, 4:26 PM

## 2014-04-14 ENCOUNTER — Ambulatory Visit (HOSPITAL_COMMUNITY)
Admission: RE | Admit: 2014-04-14 | Discharge: 2014-04-14 | Disposition: A | Discharge status: Home / Self Care | Payer: Medicare Other | Source: Ambulatory Visit | Attending: Family Medicine | Admitting: Family Medicine

## 2014-04-14 DIAGNOSIS — IMO0001 Reserved for inherently not codable concepts without codable children: Secondary | ICD-10-CM | POA: Diagnosis not present

## 2014-04-14 NOTE — Evaluation (Signed)
Occupational Therapy Re-Evaluation  Patient Details  Name: Tracy Pratt MRN: [id number] Date of Birth: June 17, 1931  Today's Date: 04/14/2014 Time: 6010-9323 OT Time Calculation (min): 49 min Manual 1302-1315 (13') ROM Testing 1315-1330 (27') Therapeutic Exercises 1330-1351 (21')  Visit#: 7 of 24  Re-eval: 05/12/14  Assessment Diagnosis: s/p left rotator cuff tear repair Surgical Date: 03/02/14 Next MD Visit: 04/19/2014 - Dr. Eden Lathe Prior Therapy: 9 years ago. Rotator cuff pain  Authorization: Medicare  Authorization Time Period: before 17th visit  Authorization Visit#: 7 of 9   Past Medical History:  Past Medical History  Diagnosis Date  . BPH (benign prostatic hyperplasia)   . Malignant hyperthermia   . Adrenal adenoma     left  . Allergy   . Cataract 2007&2009    both   . Arthritis     back,hip  . Hypertension   . Spondylolisthesis     l5-s1  . Prostate cancer 08/08/2007    seed implant and external rad tx   Past Surgical History:  Past Surgical History  Procedure Laterality Date  . Transurethral resection of prostate  1997  . Cataract extraction  2007  . Corneal transplant  2007  . Knee surgery  1996    left knee arthoscopic  . Back surgery  1979  . Torn meniscus left knee  1996    Subjective Symptoms/Limitations Symptoms: "My neck's been stiff today."  "I don't have any problems putting my shirt on anymore, but I'm still not trying to lift anything with that arm." Pain Assessment Currently in Pain?: No/denies  Precautions/Restrictions  Precautions Precautions: Shoulder Type of Shoulder Precautions: PROM for 2 weeks, start AAROM on 04/06/14, AROM may begin on 04/20/14.   Shoulder Interventions: Shoulder sling/immobilizer  Assessment ADL/Vision/Perception ADL ADL Comments: significant improvements in getting dressed.  pt still leery of using left hand to don socks and shoes.  Additional Assessments LUE AROM (degrees) LUE Overall AROM  Comments: Will begain AROM 6/23 LUE PROM (degrees) LUE Overall PROM Comments: Assess in supine - with ER/IR shoulder adducted.  (Previous Measurements 6/17) Left Shoulder Flexion: 138 Degrees (41) Left Shoulder ABduction: 116 Degrees (71) Left Shoulder Internal Rotation: 90 Degrees (75) Left Shoulder External Rotation: 39 Degrees (10) Palpation Palpation: Mod-Max fascial restrictions in left shoulder upper arm, trapezius, and scapularis.     Exercise/Treatments Supine Protraction: PROM;AAROM;10 reps Horizontal ABduction: PROM;AAROM;10 reps External Rotation: PROM;AAROM;10 reps Internal Rotation: PROM;AAROM;10 reps Flexion: PROM;AAROM;10 reps ABduction: PROM;AAROM;10 reps Pulleys Flexion: 1 minute ABduction: 1 minute   Manual Therapy Manual Therapy: Myofascial release Myofascial Release: Myofascial release and manual stretching to left upper arm, trapezius, and scapularis region to decrease fascial restrictions and increase joint mobility in a pain free zone.    Occupational Therapy Assessment and Plan OT Assessment and Plan Clinical Impression Statement: Re-assessment completed this session. pt is progressing well in therapy sessions. He has met 2/5 STG and 1/5 LTG, with good progress towards remaining goals. Rehab Potential: Excellent OT Frequency: Min 2X/week OT Duration: 12 weeks OT Treatment/Interventions: Self-care/ADL training;Therapeutic exercise;Modalities;Therapeutic activities;Manual therapy;Patient/family education;DME and/or AE instruction OT Plan: AROM begins 6/23.  Continue OT for additional 8 weeks as needed. follow up on dowel HEP   Goals Short Term Goals Time to Complete Short Term Goals: 6 weeks Short Term Goal 1: Patient will be educated on HEP. Short Term Goal 1 Progress: Met Short Term Goal 2: Patient will report a pain level of 3/10 with daily tasks. Short Term Goal 2 Progress: Met Short Term  Goal 3: Patient will increase PROM to WNL to increase  ability to get dressed. 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 Short Term Goal 4 Progress: Progressing toward goal Short Term Goal 5: Patient will decrease fascial restrictions to mod amount. Short Term Goal 5 Progress: Progressing toward goal Long Term Goals Time to Complete Long Term Goals: 12 weeks Long Term Goal 1: Patient will return to highest level of independence with all B/IADL and leisure activities.  Long Term Goal 1 Progress: Progressing toward goal Long Term Goal 2: Patient will report a pain score of 2/10 or less with daily tasks. 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 Long Term Goal 4 Progress: Progressing toward goal Long Term Goal 5: Patient will decrease amount of fascial restrictions in min amount. Long Term Goal 5 Progress: Progressing toward goal  Problem List Patient Active Problem List   Diagnosis Date Noted  . Pain in joint, shoulder region 03/23/2014  . Muscle weakness (generalized) 03/23/2014  . Decreased range of motion of left shoulder 03/23/2014  . Prostate cancer 10/31/2011  . Spondylolisthesis   . BPH (benign prostatic hyperplasia)   . Malignant hyperthermia   . Adrenal adenoma     End of Session Activity Tolerance: Patient tolerated treatment well General Behavior During Therapy: WFL for tasks assessed/performed OT Plan of Care OT Home Exercise Plan: Dowel rod exercises OT Patient Instructions: handout (scanned) Consulted and Agree with Plan of Care: Patient  GO Functional Assessment Tool Used: FOTO score:  60/100 (40% limited).  Initial 0/100 (100% limited) Functional Limitation: Carrying, moving and handling objects Carrying, Moving and Handling Objects Current Status [phone]): At least 40 percent but less than 60 percent impaired, limited or restricted Carrying, Moving and Handling Objects Goal Status [phone]): At least 20 percent but less than 40 percent impaired, limited or restricted  Bea Graff, [place], OTR/L [phone]  04/14/2014, 4:07 PM  Physician Documentation Your signature is required to indicate approval of the treatment plan as stated above.  Please sign and either send electronically or make a copy of this report for your files and return this physician signed original.  Please mark one 1.__approve of plan  2. ___approve of plan with the following conditions.   ______________________________                                                          _____________________ Physician Signature  Date  

## 2014-04-17 DIAGNOSIS — S139XXA Sprain of joints and ligaments of unspecified parts of neck, initial encounter: Secondary | ICD-10-CM | POA: Diagnosis not present

## 2014-04-17 DIAGNOSIS — Z6831 Body mass index (BMI) 31.0-31.9, adult: Secondary | ICD-10-CM | POA: Diagnosis not present

## 2014-04-17 DIAGNOSIS — R609 Edema, unspecified: Secondary | ICD-10-CM | POA: Diagnosis not present

## 2014-04-17 DIAGNOSIS — L989 Disorder of the skin and subcutaneous tissue, unspecified: Secondary | ICD-10-CM | POA: Diagnosis not present

## 2014-04-19 ENCOUNTER — Ambulatory Visit (HOSPITAL_COMMUNITY): Payer: Medicare Other

## 2014-04-19 DIAGNOSIS — Z9889 Other specified postprocedural states: Secondary | ICD-10-CM | POA: Insufficient documentation

## 2014-04-19 DIAGNOSIS — Z4789 Encounter for other orthopedic aftercare: Secondary | ICD-10-CM | POA: Diagnosis not present

## 2014-04-19 DIAGNOSIS — M25519 Pain in unspecified shoulder: Secondary | ICD-10-CM | POA: Diagnosis not present

## 2014-04-21 ENCOUNTER — Ambulatory Visit (HOSPITAL_COMMUNITY)
Admission: RE | Admit: 2014-04-21 | Discharge: 2014-04-21 | Disposition: A | Discharge status: Home / Self Care | Payer: Medicare Other | Source: Ambulatory Visit | Attending: Family Medicine | Admitting: Family Medicine

## 2014-04-21 DIAGNOSIS — IMO0001 Reserved for inherently not codable concepts without codable children: Secondary | ICD-10-CM | POA: Diagnosis not present

## 2014-04-21 NOTE — Progress Notes (Signed)
Occupational Therapy Treatment Patient Details  Name: Tracy Pratt MRN: [id number] Date of Birth: 1931/05/04  Today's Date: 04/21/2014 Time: 9147-8295 OT Time Calculation (min): 40 min MFR 6213-0865 9' Therex 7846-9629 31'  Visit#: 8 of 24  Re-eval: 05/12/14    Authorization: Medicare  Authorization Time Period: before 17th visit  Authorization Visit#: 8 of 17  Subjective Symptoms/Limitations Symptoms: S: I had my doctor's appt and it went really well. I got a new sling to wear and I can start to be more free with my arm.  Pain Assessment Currently in Pain?: No/denies  Precautions/Restrictions  Precautions Precautions: Shoulder Type of Shoulder Precautions: PROM for 2 weeks, start AAROM on 04/06/14, AROM may begin on 04/20/14.    Exercise/Treatments Supine Protraction: PROM;5 reps;AROM;10 reps Horizontal ABduction: PROM;5 reps;AROM;10 reps External Rotation: PROM;5 reps;AROM;10 reps Internal Rotation: PROM;5 reps;AROM;10 reps Flexion: PROM;5 reps;AROM;10 reps ABduction: PROM;5 reps;AROM;10 reps Standing Protraction: AAROM;10 reps Horizontal ABduction: AAROM;10 reps External Rotation: AAROM;10 reps Internal Rotation: AAROM;10 reps Flexion: AAROM;10 reps ABduction: AAROM;10 reps ROM / Strengthening / Isometric Strengthening Wall Wash: 1'         Manual Therapy Manual Therapy: Myofascial release Myofascial Release: Myofascial release and manual stretching to left upper arm, trapezius, and scapularis region to decrease fascial restrictions and increase joint mobility in a pain free zone.   Occupational Therapy Assessment and Plan OT Assessment and Plan Clinical Impression Statement: A: Added wall wash and AROM supine, AAROM standing. Patient tolerated well.  OT Plan: P: Progress to AROM standing when able to tolerate. Add theraband for scapular stability.     Goals Short Term Goals Time to Complete Short Term Goals: 6 weeks Short Term Goal 1: Patient will  be educated on HEP. Short Term Goal 2: Patient will report a pain level of 3/10 with daily tasks. Short Term Goal 3: Patient will increase PROM to WNL to increase ability to get dressed. 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 Short Term Goal 4 Progress: Progressing toward goal Short Term Goal 5: Patient will decrease fascial restrictions to mod amount. Short Term Goal 5 Progress: Progressing toward goal Long Term Goals Time to Complete Long Term Goals: 12 weeks Long Term Goal 1: Patient will return to highest level of independence with all B/IADL and leisure activities.  Long Term Goal 1 Progress: Progressing toward goal Long Term Goal 2: Patient will report a pain score of 2/10 or less with daily tasks. 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 Long Term Goal 4 Progress: Progressing toward goal Long Term Goal 5: Patient will decrease amount of fascial restrictions in min amount. Long Term Goal 5 Progress: Progressing toward goal  Problem List Patient Active Problem List   Diagnosis Date Noted  . Pain in joint, shoulder region 03/23/2014  . Muscle weakness (generalized) 03/23/2014  . Decreased range of motion of left shoulder 03/23/2014  . Prostate cancer 10/31/2011  . Spondylolisthesis   . BPH (benign prostatic hyperplasia)   . Malignant hyperthermia   . Adrenal adenoma     End of Session Activity Tolerance: Patient tolerated  treatment well General Behavior During Therapy: [facility] for tasks assessed/performed   [name], OTR/L,CBIS   04/21/2014, 4:21 PM

## 2014-04-23 ENCOUNTER — Ambulatory Visit (HOSPITAL_COMMUNITY)
Admission: RE | Admit: 2014-04-23 | Discharge: 2014-04-23 | Disposition: A | Discharge status: Home / Self Care | Payer: Medicare Other | Source: Ambulatory Visit | Attending: Family Medicine | Admitting: Family Medicine

## 2014-04-23 DIAGNOSIS — IMO0001 Reserved for inherently not codable concepts without codable children: Secondary | ICD-10-CM | POA: Diagnosis not present

## 2014-04-23 NOTE — Progress Notes (Signed)
Occupational Therapy Treatment Patient Details  Name: Roark Rufo MRN: [id number] Date of Birth: 11-26-1930  Today's Date: 04/23/2014 Time: 1020-1103 OT Time Calculation (min): 43 min Manual 1020-1035 (15') Therapeutic Exercises 1035-1103 (28')  Visit#: 9 of 24  Re-eval: 05/12/14    Authorization: Medicare  Authorization Time Period: before 17th visit  Authorization Visit#: 9 of 17  Subjective Symptoms/Limitations Symptoms: S: I've been putting my shirt on using both my amrs - is that ok?  And I can put my socks on using both hands now! Pain Assessment Currently in Pain?: No/denies  Exercise/Treatments Supine Protraction: PROM;5 reps;AROM;10 reps Horizontal ABduction: PROM;5 reps;AROM;10 reps External Rotation: PROM;5 reps;AROM;10 reps Internal Rotation: PROM;5 reps;AROM;10 reps Flexion: PROM;5 reps;AROM;10 reps ABduction: PROM;5 reps;AROM;10 reps Standing Protraction: AAROM;10 reps Horizontal ABduction: AAROM;10 reps External Rotation: AAROM;10 reps Internal Rotation: AAROM;10 reps Flexion: AAROM;10 reps ABduction: AAROM;10 reps     Manual Therapy Manual Therapy: Myofascial release Myofascial Release: Myofascial release and manual stretching to left upper arm, trapezius, and scapularis region to decrease fascial restrictions and increase joint mobility in a pain free zone.    Occupational Therapy Assessment and Plan OT Assessment and Plan Clinical Impression Statement: Pt verbalized soreness after previous session.  continued exercises initated last session - pt had good tolerance, and completed each exercise with extra time needed.  theraband not added this session due to timing. OT Plan: P: Progress to AROM standing when able to tolerate. Add theraband for scapular stability.     Goals Short Term Goals Short Term Goal 1: Patient will be educated on HEP. Short Term Goal 1 Progress: Met Short Term Goal 2: Patient will report a pain level of 3/10 with daily  tasks. Short Term Goal 2 Progress: Met Short Term Goal 3: Patient will increase PROM to WNL to increase ability to get dressed. 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 Short Term Goal 4 Progress: Progressing toward goal Short Term Goal 5: Patient will decrease fascial restrictions to mod amount. Short Term Goal 5 Progress: Progressing toward goal Long Term Goals Long Term Goal 1: Patient will return to highest level of independence with all B/IADL and leisure activities.  Long Term Goal 1 Progress: Progressing toward goal Long Term Goal 2: Patient will report a pain score of 2/10 or less with daily tasks. 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 Long Term Goal 4 Progress: Progressing toward goal Long Term Goal 5: Patient will decrease amount of fascial restrictions in min amount. Long Term Goal 5 Progress: Progressing toward goal  Problem List Patient Active Problem List   Diagnosis Date Noted  . Pain in joint, shoulder region 03/23/2014  . Muscle weakness (generalized) 03/23/2014  . Decreased range of motion of left shoulder 03/23/2014  . Prostate cancer 10/31/2011  . Spondylolisthesis   . BPH (benign prostatic hyperplasia)   . Malignant hyperthermia   . Adrenal adenoma     End of Session Activity Tolerance: Patient tolerated treatment well General Behavior During Therapy: [facility] for tasks assessed/performed  GO    Lelan Pons  Debbora Lacrosse, [place], OTR/L [phone]  04/23/2014, 11:39 AM

## 2014-04-27 ENCOUNTER — Ambulatory Visit (HOSPITAL_COMMUNITY)
Admission: RE | Admit: 2014-04-27 | Discharge: 2014-04-27 | Disposition: A | Discharge status: Home / Self Care | Payer: Medicare Other | Source: Ambulatory Visit | Attending: Family Medicine | Admitting: Family Medicine

## 2014-04-27 DIAGNOSIS — IMO0001 Reserved for inherently not codable concepts without codable children: Secondary | ICD-10-CM | POA: Diagnosis not present

## 2014-04-27 NOTE — Progress Notes (Signed)
Occupational Therapy Treatment Patient Details  Name: Tracy Pratt MRN: [id number] Date of Birth: Nov 07, 1930  Today's Date: 04/27/2014 Time: 1015-1100 OT Time Calculation (min): 45 min Manual 1015-1035 (20') Therapeutic Exercises 1035-1100 (25')  Visit#: 10 of 24  Re-eval: 05/12/14    Authorization: Medicare  Authorization Time Period: before 17th visit  Authorization Visit#: 10 of 17  Subjective Symptoms/Limitations Symptoms: "I've been wearing this thing for 7 weeks now - i'm ready for it to be coming off" Pain Assessment Currently in Pain?: No/denies  Precautions/Restrictions     Exercise/Treatments Supine Protraction: PROM;5 reps;AROM;12 reps Horizontal ABduction: PROM;5 reps;AROM;12 reps External Rotation: PROM;5 reps;AROM;12 reps Internal Rotation: PROM;5 reps;AROM;12 reps Flexion: PROM;5 reps;AROM;12 reps ABduction: PROM;5 reps;AROM;12 reps Standing Protraction: AAROM;12 reps Horizontal ABduction: AAROM;12 reps External Rotation: AAROM;12 reps Internal Rotation: AAROM;12 reps Flexion: AAROM;12 reps ABduction: AAROM;12 reps Extension: Theraband;10 reps Theraband Level (Shoulder Extension): Level 2 (Red) (upgrade to green next time) Row: Theraband;10 reps Theraband Level (Shoulder Row): Level 2 (Red) (upgrade to green next time)      Manual Therapy Manual Therapy: Myofascial release Myofascial Release: Myofascial release and manual stretching to left upper arm, trapezius, and scapularis region to decrease fascial restrictions and increase joint mobility in a pain free zone.    Occupational Therapy Assessment and Plan OT Assessment and Plan Clinical Impression Statement: Pt verbalizes ability to go 'a couple hours' without wearing sling - encouraged pt to keep slowly increasing time without sling.  Increased reps supine AROM this session - pt tolerated well.  Increased reps with standing AAROM as well - pt fatigued but tolerted well.  Verbalizes no  questions about AAROM for HEP from previous session.  Added red theraband extensino and row this session - pt had no difficulties wth red.  OT Plan: P: Add proximal shoulder strengthening in supine. Upgrade theraband to green. continue using mirror for standng AAROM for improved depression of left shoulder.   Goals Short Term Goals Short Term Goal 1: Patient will be educated on HEP. Short Term Goal 1 Progress: Met Short Term Goal 2: Patient will report a pain level of 3/10 with daily tasks. Short Term Goal 2 Progress: Met Short Term Goal 3: Patient will increase PROM to WNL to increase ability to get dressed. 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 Short Term Goal 4 Progress: Progressing toward goal Short Term Goal 5: Patient will decrease fascial restrictions to mod amount. Short Term Goal 5 Progress: Progressing toward goal Long Term Goals Long Term Goal 1: Patient will return to highest level of independence with all B/IADL and leisure activities.  Long Term Goal 1 Progress: Progressing toward goal Long Term Goal 2: Patient will report a pain score of 2/10 or less with daily tasks. Long Term Goal 2 Progress: Met Long Term Goal 3: Patient will increase AROM to 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 Long Term Goal 4 Progress: Progressing toward goal Long Term Goal 5: Patient will decrease amount of fascial restrictions in min amount. Long Term Goal 5 Progress: Progressing toward goal  Problem List Patient Active Problem List   Diagnosis Date Noted  .  Pain in joint, shoulder region 03/23/2014  . Muscle weakness (generalized)  03/23/2014  . Decreased range of motion of left shoulder 03/23/2014  . Prostate cancer 10/31/2011  . Spondylolisthesis   . BPH (benign prostatic hyperplasia)   . Malignant hyperthermia   . Adrenal adenoma     End of Session Activity Tolerance: Patient tolerated treatment well General Behavior During Therapy: [facility] for tasks assessed/performed  GO    Bea Graff, MS, OTR/L [phone]  04/27/2014, 11:01 AM

## 2014-04-29 ENCOUNTER — Ambulatory Visit (HOSPITAL_COMMUNITY)
Admission: RE | Admit: 2014-04-29 | Discharge: 2014-04-29 | Disposition: A | Discharge status: Home / Self Care | Payer: Medicare Other | Source: Ambulatory Visit | Attending: Family Medicine | Admitting: Family Medicine

## 2014-04-29 DIAGNOSIS — M25619 Stiffness of unspecified shoulder, not elsewhere classified: Secondary | ICD-10-CM | POA: Insufficient documentation

## 2014-04-29 DIAGNOSIS — M25519 Pain in unspecified shoulder: Secondary | ICD-10-CM | POA: Diagnosis not present

## 2014-04-29 DIAGNOSIS — IMO0001 Reserved for inherently not codable concepts without codable children: Secondary | ICD-10-CM | POA: Diagnosis not present

## 2014-04-29 DIAGNOSIS — M6281 Muscle weakness (generalized): Secondary | ICD-10-CM | POA: Insufficient documentation

## 2014-04-29 NOTE — Progress Notes (Signed)
Occupational Therapy Treatment Patient Details  Name: Tracy Pratt MRN: [id number] Date of Birth: [birth year]  Today's Date: 04/29/2014 Time: 1016-1100 OT Time Calculation (min): 44 min Manual 1016-1035 (19') Therapeutic Exercises 1035-1100 (25')  Visit#: 11 of 24  Re-eval: 05/12/14    Authorization: Medicare  Authorization Time Period: before 17th visit  Authorization Visit#: 11 of 17  Subjective Symptoms/Limitations Symptoms: "i'm ok, just a little sore this morning. i worked at [employer] for about an hour and a half yesterday." Pain Assessment Currently in Pain?: Yes Pain Score: 2  Pain Location: Shoulder Pain Orientation: Left  Exercise/Treatments Supine Protraction: PROM;10 reps;AROM;12 reps Horizontal ABduction: PROM;10 reps;AROM;12 reps External Rotation: PROM;10 reps;AROM;12 reps Internal Rotation: PROM;10 reps;AROM;12 reps Flexion: PROM;10 reps;AROM;12 reps ABduction: PROM;10 reps;AROM;12 reps Standing Protraction: AAROM;12 reps Horizontal ABduction: AAROM;12 reps External Rotation: AAROM;12 reps Internal Rotation: AAROM;12 reps Flexion: AAROM;12 reps ABduction: AAROM;12 reps Extension: Theraband;15 reps Theraband Level (Shoulder Extension): Level 3 (Green) Row: Theraband;15 reps Theraband Level (Shoulder Row): Level 3 (Green) Other Standing Exercises: 1 reps each with 5 second hold for pt education for HEP - lateral flexion and rotation to right and left for cervical neck tightness. ROM / Strengthening / Isometric Strengthening Proximal Shoulder Strengthening, Supine: 10x each up/down, criss/cross, and R/L circles   Manual Therapy Manual Therapy: Myofascial release Myofascial Release: Myofascial release and manual stretching to left upper arm, trapezius, and scapularis region to decrease fascial restrictions and increase joint mobility in a pain free zone.    Occupational Therapy Assessment and Plan OT Assessment and Plan Clinical Impression  Statement: Pt verbalizes improved tolerance of not wearing sling as much at home. Increased therband to green wtih good tolerance.  Added cervical neck stretches, secondary to pt stating he is continuing to have increased tightness in neck. OT Plan: Continue wall wash and proximal shoulder strengthening supine.  Increase standing AAROM reps.   Goals Short Term Goals Short Term Goal 1: Patient will be educated on HEP. Short Term Goal 1 Progress: Met Short Term Goal 2: Patient will report a pain level of 3/10 with daily tasks. Short Term Goal 2 Progress: Met Short Term Goal 3: Patient will increase PROM to WNL to increase ability to get dressed. 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 Short Term Goal 4 Progress: Progressing toward goal Short Term Goal 5: Patient will decrease fascial restrictions to mod amount. Short Term Goal 5 Progress: Progressing toward goal Long Term Goals Long Term Goal 1: Patient will return to highest level of independence with all B/IADL and leisure activities.  Long Term Goal 1 Progress: Progressing toward goal Long Term Goal 2: Patient will report a pain score of 2/10 or less with daily tasks. 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 Long Term Goal 4 Progress: Progressing toward goal Long Term Goal 5: Patient will decrease amount of fascial restrictions in min amount. Long Term Goal 5 Progress: Progressing toward goal  Problem List Patient Active Problem List   Diagnosis Date Noted  . Pain in joint, shoulder region 03/23/2014   .  Muscle weakness (generalized) 03/23/2014  . Decreased range of motion of left shoulder 03/23/2014  . Prostate cancer 10/31/2011  . Spondylolisthesis   . BPH (benign prostatic hyperplasia)   . Malignant hyperthermia   . Adrenal adenoma     End of Session Activity Tolerance: Patient tolerated treatment well General Behavior During Therapy: [facility] for tasks assessed/performed  GO    Bea Graff, MS, OTR/L [phone]  04/29/2014, 11:54 AM

## 2014-05-04 ENCOUNTER — Ambulatory Visit (HOSPITAL_COMMUNITY)
Admission: RE | Admit: 2014-05-04 | Discharge: 2014-05-04 | Disposition: A | Discharge status: Home / Self Care | Payer: Medicare Other | Source: Ambulatory Visit | Attending: Family Medicine | Admitting: Family Medicine

## 2014-05-04 DIAGNOSIS — IMO0001 Reserved for inherently not codable concepts without codable children: Secondary | ICD-10-CM | POA: Diagnosis not present

## 2014-05-04 NOTE — Progress Notes (Signed)
Occupational Therapy Treatment Patient Details  Name: Tracy Pratt MRN: [id number] Date of Birth: [birth year]  Today's Date: 05/04/2014 Time: 1013-1101 OT Time Calculation (min): 48 min Manual 1013-1030 (22') Therapeutic Exercises 1030-1101 (31')  Visit#: 12 of 24  Re-eval: 05/12/14    Authorization: Medicare  Authorization Time Period: before 17th visit  Authorization Visit#: 12 of 17  Subjective Symptoms/Limitations Symptoms: "i'm not really having nay pain except for when I stretch. But that shoulder's real tender.  I switched sides of the bed laast night - but getting ups getting better." Pain Assessment Currently in Pain?: Yes Pain Score: 1  Pain Location: Shoulder Pain Orientation: Left  Precautions/Restrictions     Exercise/Treatments Supine Protraction: PROM;10 reps;AROM;15 reps Horizontal ABduction: PROM;10 reps;AROM;15 reps External Rotation: PROM;10 reps;AROM;15 reps Internal Rotation: PROM;10 reps;AROM;15 reps Flexion: PROM;10 reps;AROM;15 reps ABduction: PROM;10 reps;AROM;15 reps Standing Protraction: AAROM;15 reps Horizontal ABduction: AAROM;15 reps External Rotation: AAROM;15 reps Internal Rotation: AAROM;15 reps Flexion: AROM;15 reps ABduction: AAROM;15 reps ROM / Strengthening / Isometric Strengthening Wall Wash: 1' Proximal Shoulder Strengthening, Supine: 10x each up/down, criss/cross, and R/L circles   Manual Therapy Manual Therapy: Myofascial release Myofascial Release: Myofascial release and manual stretching to left upper arm, trapezius, and scapularis region to decrease fascial restrictions and increase joint mobility in a pain free zone.    Occupational Therapy Assessment and Plan OT Assessment and Plan Clinical Impression Statement: Pt is verbalizing ability to take sling off when he gets home, not needing to wear around the house. Encouraged pt to continue wearing when he may be ina crowded situation, for safety.  Pt vebalizes  decreaed tigntness in cervical neck, and goo duse of stretches at home.  increased reps with supine AROM and standing AAROM, and pt tolerated well. OT Plan: P: Add supine stargazer stretch for improved external rotation.  Add standing AROM.   Goals Short Term Goals Short Term Goal 1: Patient will be educated on HEP. Short Term Goal 1 Progress: Met Short Term Goal 2: Patient will report a pain level of 3/10 with daily tasks. Short Term Goal 2 Progress: Met Short Term Goal 3: Patient will increase PROM to WNL to increase ability to get dressed. 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 Short Term Goal 4 Progress: Progressing toward goal Short Term Goal 5: Patient will decrease fascial restrictions to mod amount. Short Term Goal 5 Progress: Progressing toward goal Long Term Goals Long Term Goal 1: Patient will return to highest level of independence with all B/IADL and leisure activities.  Long Term Goal 1 Progress: Progressing toward goal Long Term Goal 2: Patient will report a pain score of 2/10 or less with daily tasks. 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 Long Term Goal 4 Progress: Progressing toward goal Long Term Goal 5: Patient will decrease amount of fascial restrictions in min amount. Long Term Goal 5 Progress: Progressing toward goal  Problem List Patient Active Problem List   Diagnosis Date Noted  . Pain in joint, shoulder region 03/23/2014  . Muscle weakness (generalized) 03/23/2014  .  Decreased range of motion of left shoulder 03/23/2014   . Prostate cancer 10/31/2011  . Spondylolisthesis   . BPH (benign prostatic hyperplasia)   . Malignant hyperthermia   . Adrenal adenoma     End of Session Activity Tolerance: Patient tolerated treatment well General Behavior During Therapy: [facility] for tasks assessed/performed  GO    Bea Graff, MS, OTR/L [phone]  05/04/2014, 11:07 AM

## 2014-05-06 ENCOUNTER — Ambulatory Visit (HOSPITAL_COMMUNITY)
Admission: RE | Admit: 2014-05-06 | Discharge: 2014-05-06 | Disposition: A | Discharge status: Home / Self Care | Payer: Medicare Other | Source: Ambulatory Visit | Attending: Family Medicine | Admitting: Family Medicine

## 2014-05-06 DIAGNOSIS — IMO0001 Reserved for inherently not codable concepts without codable children: Secondary | ICD-10-CM | POA: Diagnosis not present

## 2014-05-06 NOTE — Progress Notes (Signed)
Occupational Therapy Treatment Patient Details  Name: Tracy Pratt MRN: [id number] Date of Birth: 11-Jan-1931  Today's Date: 05/06/2014 Time: 1016-1101 OT Time Calculation (min): 45 min Manual 1016-1026 (10') Therapeutic Exercises 1026-1101 (52')  Visit#: 13 of 24  Re-eval: 05/12/14    Authorization: Medicare  Authorization Time Period: before 17th visit  Authorization Visit#: 13 of 17  Subjective Symptoms/Limitations Symptoms: "I'm a little sore in there, for some reason or another. i got out in the yard yesterday and pulled a few weeds." Pain Assessment Currently in Pain?: No/denies  Exercise/Treatments Supine Protraction: PROM;5 reps;AROM;15 reps Horizontal ABduction: PROM;5 reps;AROM;15 reps External Rotation: PROM;5 reps;AROM;15 reps Internal Rotation: PROM;5 reps;AROM;15 reps Flexion: PROM;5 reps;AROM;15 reps ABduction: PROM;5 reps;AROM;15 reps Standing Protraction: AROM;10 reps Horizontal ABduction: AROM;10 reps External Rotation: AROM;10 reps Internal Rotation: AROM;10 reps Flexion: AROM;10 reps ABduction: AROM;10 reps ROM / Strengthening / Isometric Strengthening Wall Wash: 2' Proximal Shoulder Strengthening, Seated: 10 x each, in standing   Stretches External Rotation Stretch: 5 reps;10 seconds (stargazer stretch)    Manual Therapy Manual Therapy: Myofascial release Myofascial Release: Myofascial release and manual stretching to left upper arm, trapezius, and scapularis region to decrease fascial restrictions and increase joint mobility in a pain free zone.   Occupational Therapy Assessment and Plan OT Assessment and Plan Clinical Impression Statement: Pt still reporting frustration with sling - encouraged pt to only wear as needed when in crowded situations, and to not feel he has to wear it all the time. Added stargazer stretch for improved external rotation - pt toelrated well and verbalized good stretch (with pillow moved to allow further stretch).  Pt still tolerating well increaed reps AROM supine. Added standing AROM this session, and pt had very slight fatigue.  Increased wall wash to 2 min with slight fatigue. OT Plan: P: Increase standing AROM reps.  Add 1# to supine exercises.   Goals Short Term Goals Short Term Goal 1: Patient will be educated on HEP. Short Term Goal 1 Progress: Met Short Term Goal 2: Patient will report a pain level of 3/10 with daily tasks. Short Term Goal 2 Progress: Met Short Term Goal 3: Patient will increase PROM to WNL to increase ability to get dressed. 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 Short Term Goal 4 Progress: Progressing toward goal Short Term Goal 5: Patient will decrease fascial restrictions to mod amount. Short Term Goal 5 Progress: Progressing toward goal Long Term Goals Long Term Goal 1: Patient will return to highest level of independence with all B/IADL and leisure activities.  Long Term Goal 1 Progress: Progressing toward goal Long Term Goal 2: Patient will report a pain score of 2/10 or less with daily tasks. 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 Long Term Goal 4 Progress: Progressing toward goal Long Term Goal 5: Patient will decrease amount of fascial restrictions in min amount. Long Term Goal 5 Progress: Progressing toward goal  Problem List Patient Active Problem List   Diagnosis Date Noted  . Pain in joint, shoulder region 03/23/2014  . Muscle  weakness (generalized) 03/23/2014  . Decreased range of motion of left shoulder 03/23/2014  .  Prostate cancer 10/31/2011  . Spondylolisthesis   . BPH (benign prostatic hyperplasia)   . Malignant hyperthermia   . Adrenal adenoma     End of Session Activity Tolerance: Patient tolerated treatment well General Behavior During Therapy: [employer] for tasks assessed/performed  GO    Bea Graff, MS, OTR/L [phone]  05/06/2014, 11:03 AM

## 2014-05-13 ENCOUNTER — Ambulatory Visit (HOSPITAL_COMMUNITY)
Admission: RE | Admit: 2014-05-13 | Discharge: 2014-05-13 | Disposition: A | Discharge status: Home / Self Care | Payer: Medicare Other | Source: Ambulatory Visit | Attending: Family Medicine | Admitting: Family Medicine

## 2014-05-13 DIAGNOSIS — IMO0001 Reserved for inherently not codable concepts without codable children: Secondary | ICD-10-CM | POA: Diagnosis not present

## 2014-05-13 NOTE — Progress Notes (Signed)
Occupational Therapy Treatment Patient Details  Name: Tracy Pratt MRN: [id number] Date of Birth: 1931/03/05  Today's Date: 05/13/2014 Time: 1020-1100 OT Time Calculation (min): 40 min MFR 1020-1030 10' Therex 1030-1100 30'  Visit#: 14 of 24  Re-eval: 05/12/14    Authorization: Medicare  Authorization Time Period: before 17th visit  Authorization Visit#: 14 of 17  Subjective Symptoms/Limitations Symptoms: S: I"m not wearing the sling anymore. Pain Assessment Currently in Pain?: No/denies  Precautions/Restrictions  Precautions Precautions: Shoulder Type of Shoulder Precautions: PROM for 2 weeks, start AAROM on 04/06/14, AROM may begin on 04/20/14.    Exercise/Treatments Supine Protraction Weight (lbs): 1 Horizontal ABduction: PROM;5 reps;Strengthening;Weights;10 reps Horizontal ABduction Weight (lbs): 1 External Rotation: PROM;5 reps;Strengthening;Weights;10 reps External Rotation Weight (lbs): 1 Internal Rotation: PROM;5 reps;Strengthening;Weights;10 reps Internal Rotation Weight (lbs): 1 Flexion: PROM;5 reps;Strengthening;Weights;10 reps Shoulder Flexion Weight (lbs): 1 ABduction: PROM;5 reps;Strengthening;Weights;10 reps Shoulder ABduction Weight (lbs): 1 Standing Protraction: Strengthening;Weights;10 reps Protraction Weight (lbs): 1 Horizontal ABduction: Strengthening;Weights;10 reps Horizontal ABduction Weight (lbs): 1 External Rotation: Strengthening;Weights;10 reps External Rotation Weight (lbs): 1 Internal Rotation: Strengthening;Weights;10 reps Internal Rotation Weight (lbs): 1 Flexion: Strengthening;Weights;10 reps Shoulder Flexion Weight (lbs): 1 ABduction: Strengthening;Weights;10 reps Shoulder ABduction Weight (lbs): 1 ROM / Strengthening / Isometric Strengthening UBE (Upper Arm Bike): Level 1 3' forward 3' reverse Wall Wash: 1' with 1# Proximal Shoulder Strengthening, Supine: 10X with 1# Proximal Shoulder Strengthening, Seated: 10X with 1#        Manual Therapy Manual Therapy: Myofascial release Myofascial Release: Myofascial release and manual stretching to left upper arm, trapezius, and scapularis region to decrease fascial restrictions and increase joint mobility in a pain free zone.  Occupational Therapy Assessment and Plan OT Assessment and Plan Clinical Impression Statement: A: Added 1# handweight to supine and standing exercises, added 1# to wall wash, and added UBE bike. patient tolerated well.  OT Plan: P: Reassess   Goals Short Term Goals Time to Complete Short Term Goals: 6 weeks Short Term Goal 1: Patient will be educated on HEP. Short Term Goal 2: Patient will report a pain level of 3/10 with daily tasks. Short Term Goal 3: Patient will increase PROM to WNL to increase ability to get dressed. 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 Short Term Goal 4 Progress: Progressing toward goal Short Term Goal 5: Patient will decrease fascial restrictions to mod amount. Short Term Goal 5 Progress: Progressing toward goal Long Term Goals Time to Complete Long Term Goals: 12 weeks Long Term Goal 1: Patient will return to highest level of independence with all B/IADL and leisure activities.  Long Term Goal 1 Progress: Progressing toward goal Long Term Goal 2: Patient will report a pain score of 2/10 or less with daily tasks. 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 Long Term Goal 4 Progress: Progressing toward goal Long Term Goal 5: Patient will decrease amount of fascial restrictions in min amount. Long Term Goal 5  Progress: Progressing toward goal  Problem List Patient Active Problem List   Diagnosis Date Noted  . Pain in joint, shoulder region 03/23/2014  .  Muscle weakness (generalized) 03/23/2014  . Decreased range of motion of left shoulder 03/23/2014  . Prostate cancer 10/31/2011  . Spondylolisthesis   . BPH (benign prostatic hyperplasia)   . Malignant hyperthermia   . Adrenal adenoma     End of Session Activity Tolerance: Patient tolerated treatment well General Behavior During Therapy: [facility] for tasks assessed/performed  [name], OTR/L,CBIS   05/13/2014, 10:56 AM

## 2014-05-17 DIAGNOSIS — IMO0001 Reserved for inherently not codable concepts without codable children: Secondary | ICD-10-CM | POA: Diagnosis not present

## 2014-05-18 ENCOUNTER — Ambulatory Visit (HOSPITAL_COMMUNITY)
Admission: RE | Admit: 2014-05-18 | Discharge: 2014-05-18 | Disposition: A | Discharge status: Home / Self Care | Payer: Medicare Other | Source: Ambulatory Visit | Attending: Orthopedic Surgery | Admitting: Orthopedic Surgery

## 2014-05-18 DIAGNOSIS — IMO0001 Reserved for inherently not codable concepts without codable children: Secondary | ICD-10-CM | POA: Diagnosis not present

## 2014-05-18 NOTE — Evaluation (Signed)
Occupational Therapy Reassessment and Treatment  Patient Details  Name: Tracy Pratt MRN: [id number] Date of Birth: 1931/04/02  Today's Date: 05/18/2014 Time: 1009-1100 OT Time Calculation (min): 51 min MFR 5852-7782 12' Reassess 4235-3614 24' Therex 1045-1100 15'  Visit#: 15 of 24  Re-eval: 06/15/14  Assessment Diagnosis: s/p left rotator cuff tear repair  Authorization: Medicare  Authorization Time Period: before 27th visit  Authorization Visit#: 72 of 58   Past Medical History:  Past Medical History  Diagnosis Date  . BPH (benign prostatic hyperplasia)   . Malignant hyperthermia   . Adrenal adenoma     left  . Allergy   . Cataract 2007&2009    both   . Arthritis     back,hip  . Hypertension   . Spondylolisthesis     l5-s1  . Prostate cancer 08/08/2007    seed implant and external rad tx   Past Surgical History:  Past Surgical History  Procedure Laterality Date  . Transurethral resection of prostate  1997  . Cataract extraction  2007  . Corneal transplant  2007  . Knee surgery  1996    left knee arthoscopic  . Back surgery  1979  . Torn meniscus left knee  1996    Subjective Symptoms/Limitations Symptoms: S: I used my weed whacker the other day to do the trim along my driveway.  Pain Assessment Currently in Pain?: Yes Pain Score: 1  Pain Location: Shoulder Pain Orientation: Left Pain Type: Acute pain  Precautions/Restrictions  Precautions Precautions: Shoulder Type of Shoulder Precautions: Progress as tolerated   Assessment Additional Assessments LUE PROM (degrees) LUE Overall PROM Comments: Assessed seated. IR/ER adducted Left Shoulder Flexion: 122 Degrees (last progress note: 138 (supine)) Left Shoulder ABduction: 126 Degrees (last progress note: 116 (supine)) Left Shoulder Internal Rotation: 90 Degrees (same last progress note) Left Shoulder External Rotation: 55 Degrees (last progress note: 39 (supine)) LUE Strength LUE Overall  Strength:  (assessed seated. IR/ER adducted) LUE Overall Strength Comments: Strength has not been assessed prior to this date.  Left Shoulder Flexion: 4/5 Left Shoulder ABduction:  (4-/5) Left Shoulder Internal Rotation: 3+/5 Left Shoulder External Rotation:  (4-/5) Palpation Palpation: Min fascial restrictions in left shoulder, trapezius, scapularis region.      Exercise/Treatments Standing Protraction: Strengthening;15 reps Protraction Weight (lbs): 1 Horizontal ABduction: Strengthening;12 reps Horizontal ABduction Weight (lbs): 1 External Rotation: Strengthening;12 reps External Rotation Weight (lbs): 1 Internal Rotation: Strengthening;12 reps Internal Rotation Weight (lbs): 1 Flexion: Strengthening;12 reps Shoulder Flexion Weight (lbs): 1 ABduction: Strengthening;12 reps Shoulder ABduction Weight (lbs): 1 ROM / Strengthening / Isometric Strengthening UBE (Upper Arm Bike): Level 1 3' forward 3' reverse        Manual Therapy Manual Therapy: Myofascial release Myofascial Release: Myofascial release and manual stretching to left upper arm, trapezius, and scapularis region to decrease fascial restrictions and increase joint mobility in a pain free zone  Occupational Therapy Assessment and Plan OT Assessment and Plan Clinical Impression Statement: A: reassessment completed this date. Patient has met 5/5 STGs and 2/5 LTGs. Patient has made improvements in strength and ROM.  OT Plan: P: Cont therapy 2x/week for 4 weeks. Tx plan: Work on strengthening scapular and shoulder stability, lifting items of weight overhead, shoulder endurance when working overhead, Update HEP for vacation the first week of August.   Goals Short Term Goals Time to Complete Short Term Goals: 6 weeks Short Term Goal 1: Patient will be educated on HEP. Short Term Goal 2: Patient will report a  pain level of 3/10 with daily tasks. Short Term Goal 3: Patient will increase PROM to WNL to increase ability to  get dressed. 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 Short Term Goal 4 Progress: Met Short Term Goal 5: Patient will decrease fascial restrictions to mod amount. Short Term Goal 5 Progress: Met Long Term Goals Time to Complete Long Term Goals: 12 weeks Long Term Goal 1: Patient will return to highest level of independence with all B/IADL and leisure activities.  Long Term Goal 1 Progress: Progressing toward goal Long Term Goal 2: Patient will report a pain score of 2/10 or less with daily tasks. 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 Long Term Goal 4 Progress: Progressing toward goal Long Term Goal 5: Patient will decrease amount of fascial restrictions in min amount. Long Term Goal 5 Progress: Met  Problem List Patient Active Problem List   Diagnosis Date Noted  . Pain in joint, shoulder region 03/23/2014  . Muscle weakness (generalized) 03/23/2014  . Decreased range of motion of left shoulder 03/23/2014  . Prostate cancer 10/31/2011  . Spondylolisthesis   . BPH (benign prostatic hyperplasia)   . Malignant hyperthermia   . Adrenal adenoma     End of Session Activity Tolerance: Patient tolerated treatment well General Behavior During Therapy: [facility] for tasks assessed/performed  GO Functional Assessment Tool Used: FOTO score: 67/100 (33% impaired) Functional Limitation: Carrying, moving and handling objects Carrying, Moving and Handling Objects Current Status ([id number]): At least 40 percent but less than 60 percent impaired, limited or restricted Carrying, Moving and Handling Objects Goal Status  [phone]): At least 20 percent but less than 40 percent impaired, limited or restricted  [name], OTR/L,CBIS   05/18/2014, 10:52 AM  Physician Documentation Your signature is required to indicate approval of the treatment plan as stated above.  Please sign and either send electronically or make a copy of this report for your files and return this physician signed original.  Please mark one 1.__approve of plan  2. ___approve of plan with the following conditions.   ______________________________                                                          _____________________ Physician Signature                                                                                                             Date

## 2014-05-20 ENCOUNTER — Ambulatory Visit (HOSPITAL_COMMUNITY)
Admission: RE | Admit: 2014-05-20 | Discharge: 2014-05-20 | Disposition: A | Discharge status: Home / Self Care | Payer: Medicare Other | Source: Ambulatory Visit | Attending: Family Medicine | Admitting: Family Medicine

## 2014-05-20 DIAGNOSIS — Z8546 Personal history of malignant neoplasm of prostate: Secondary | ICD-10-CM | POA: Diagnosis not present

## 2014-05-20 DIAGNOSIS — IMO0001 Reserved for inherently not codable concepts without codable children: Secondary | ICD-10-CM | POA: Diagnosis not present

## 2014-05-20 NOTE — Progress Notes (Signed)
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 We gave out 7,000 lbs of food. Pain Assessment Currently in Pain?: Yes Pain Score: 3  Pain Location: Shoulder Pain Orientation: Left Pain Type: Acute pain  Precautions/Restrictions  Precautions Precautions: Shoulder Type of Shoulder Precautions: Progress as tolerated  Exercise/Treatments Supine Protraction: PROM;5 reps;Strengthening;Weights;12 reps Protraction Weight (lbs): 1 Horizontal ABduction: PROM;5 reps;Strengthening;Weights;12 reps Horizontal ABduction Weight (lbs): 1 External Rotation: PROM;5 reps;Strengthening;Weights;12 reps External Rotation Weight (lbs): 1 Internal Rotation: PROM;5 reps;Strengthening;Weights;12 reps Internal Rotation Weight (lbs): 1 Flexion: PROM;5 reps;Strengthening;Weights;12 reps Shoulder Flexion Weight (lbs): 1 ABduction: PROM;5 reps;Strengthening;Weights;12 reps Shoulder ABduction Weight (lbs): 1 Prone  Other Prone Exercises: [place] exercises. flexion 1 and 2, horizontal abduction 1 and 2, adduction 10X ROM / Strengthening / Isometric Strengthening Other ROM/Strengthening Exercises: Functional reaching using 10#, 5#, 3#, .5# plate weights. Pt placed plates in first level shelff using right UE to assist when weights increased.       Manual Therapy Manual Therapy: Myofascial release Myofascial Release: Myofascial release and manual stretching to left upper arm, trapezius, and scapularis region to decrease fascial restrictions and increase joint mobility in a pain free  zone  Occupational Therapy Assessment and Plan OT Assessment and Plan Clinical Impression Statement: A: Added [place] exercises prone, and functional reaching with weight plates to simulate activity in food pantry. Updated HEP with strengthening exercises.  OT Plan: P: Add [place] exercises to HEP. Cont to work on functional reaching activities. Add overhead lacing.    Goals Short Term Goals Time to Complete Short Term Goals: 6 weeks Short Term Goal 1: Patient will be educated on HEP. Short Term Goal 2: Patient will report a pain level of 3/10 with daily tasks. Short Term Goal 3: Patient will increase PROM to WNL to increase ability to get dressed. 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 Short Term Goal 5: Patient will decrease fascial restrictions to mod amount. Long Term Goals Time to Complete Long Term Goals: 12 weeks Long Term Goal 1: Patient will return to highest level of independence with all B/IADL and leisure activities.  Long Term Goal 2: Patient will report a pain score of 2/10 or less with daily tasks. 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 Long Term Goal 4: Patient will increase left shoulder strength to 4-/5 to increase ability to lift boxes of light food at food pantry. Long Term Goal 5: Patient will decrease amount of fascial restrictions in min amount.  Problem List Patient Active Problem List   Diagnosis Date Noted  . Pain in joint, shoulder region 03/23/2014  . Muscle weakness (generalized) 03/23/2014  . Decreased range of motion of left shoulder 03/23/2014  . Prostate cancer 10/31/2011  . Spondylolisthesis   . BPH (benign prostatic hyperplasia)   . Malignant hyperthermia   . Adrenal adenoma     End of Session Activity Tolerance: Patient tolerated treatment well General Behavior During  Therapy: [facility] for  tasks assessed/performed OT Plan of Care OT Home Exercise Plan: strengthening exercises OT Patient Instructions: handout (scanned) Consulted and Agree with Plan of Care: Patient   [name], OTR/L,CBIS   05/20/2014, 2:23 PM

## 2014-05-25 ENCOUNTER — Ambulatory Visit (HOSPITAL_COMMUNITY)
Admission: RE | Admit: 2014-05-25 | Discharge: 2014-05-25 | Disposition: A | Discharge status: Home / Self Care | Payer: Medicare Other | Source: Ambulatory Visit | Attending: Orthopedic Surgery | Admitting: Orthopedic Surgery

## 2014-05-25 DIAGNOSIS — IMO0001 Reserved for inherently not codable concepts without codable children: Secondary | ICD-10-CM | POA: Diagnosis not present

## 2014-05-25 NOTE — Progress Notes (Signed)
Occupational Therapy Treatment Patient Details  Name: Tracy Pratt MRN: [id number] Date of Birth: September 23, 1931  Today's Date: 05/25/2014 Time: 1100-1143 OT Time Calculation (min): 43 min Manual 1100-1114 (14') Therapeutic Exercises 1114-1143 (110')  Visit#: 17 of 24  Re-eval: 06/15/14    Authorization: Medicare  Authorization Time Period: before 27th visit  Authorization Visit#: 17 of 27  Subjective Symptoms/Limitations Symptoms: "Its just more sore tahn anything else, until I start putting it up there!" Pain Assessment Currently in Pain?: Yes Pain Score: 1  Pain Location: Shoulder Pain Orientation: Left Pain Type: Acute pain  Exercise/Treatments Supine Protraction: PROM;5 reps;Strengthening;15 reps Protraction Weight (lbs): 1 Horizontal ABduction: PROM;5 reps;Strengthening;15 reps Horizontal ABduction Weight (lbs): 1 External Rotation: PROM;5 reps;Strengthening;15 reps External Rotation Weight (lbs): 1 Internal Rotation: PROM;5 reps;Strengthening;15 reps Internal Rotation Weight (lbs): 1 Flexion: PROM;5 reps;Strengthening;15 reps Shoulder Flexion Weight (lbs): 1 ABduction: PROM;5 reps;Strengthening;15 reps Shoulder ABduction Weight (lbs): 1 Prone  Other Prone Exercises: Hughston exercises. flexion 1 and 2, horizontal abduction 1 and 2, extension; 10X Standing Protraction: Strengthening;15 reps Protraction Weight (lbs): 1 Horizontal ABduction: Strengthening;12 reps Horizontal ABduction Weight (lbs): 1 External Rotation: Strengthening;12 reps External Rotation Weight (lbs): 1 Internal Rotation: Strengthening;12 reps Internal Rotation Weight (lbs): 1 Flexion: Strengthening;12 reps Shoulder Flexion Weight (lbs): 1 ABduction: Strengthening;12 reps Shoulder ABduction Weight (lbs): 1 ROM / Strengthening / Isometric Strengthening Over Head Lace: 3 min        Manual Therapy Manual Therapy: Myofascial release Myofascial Release: Myofascial release and manual  stretching to left upper arm, trapezius, and scapularis region to decrease fascial restrictions and increase joint mobility in a pain free zone.    Occupational Therapy Assessment and Plan OT Assessment and Plan Clinical Impression Statement: Pt has minimal pain in LUE.  Verbalized ability to sleep on left side now.  increased reps wtih supine stregthening with good tolerance. Reviewed Hughston exercises to add to HEP. pt ahd increaed difficulty with atanding abduction this session - required rest break. Added overhead lacing this session to promote endurance in reaching activites - pt able to toelrate 3 min, but verbalized fatigue. OT Plan: Follow up on addition of Hughston exercises to HEP. Continue to work on functional reaching activites. Increase supine strengthening weights.   Goals Short Term Goals Short Term Goal 1: Patient will be educated on HEP. Short Term Goal 1 Progress: Met Short Term Goal 2: Patient will report a pain level of 3/10 with daily tasks. Short Term Goal 2 Progress: Met Short Term Goal 3: Patient will increase PROM to WNL to increase ability to get dressed. 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 Short Term Goal 4 Progress: Met Short Term Goal 5: Patient will decrease fascial restrictions to mod amount. Short Term Goal 5 Progress: Met Long Term Goals Long Term Goal 1: Patient will return to highest level of independence with all B/IADL and leisure activities.  Long Term Goal 1 Progress: Progressing toward goal Long Term Goal 2: Patient will report a pain score of 2/10 or less with daily tasks. 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 Long Term Goal 3 Progress: Progressing toward goal Long Term Goal 4: Patient will increase left  shoulder strength to 4-/5 to increase ability to lift boxes of light food  at food pantry. Long Term Goal 4 Progress: Progressing toward goal Long Term Goal 5: Patient will decrease amount of fascial restrictions in min amount. Long Term Goal 5 Progress: Met  Problem List Patient Active Problem List   Diagnosis Date Noted  . Pain in joint, shoulder region 03/23/2014  . Muscle weakness (generalized) 03/23/2014  . Decreased range of motion of left shoulder 03/23/2014  . Prostate cancer 10/31/2011  . Spondylolisthesis   . BPH (benign prostatic hyperplasia)   . Malignant hyperthermia   . Adrenal adenoma     End of Session Activity Tolerance: Patient tolerated treatment well General Behavior During Therapy: WFL for tasks assessed/performed OT Plan of Care OT Home Exercise Plan: Hughston exercises 1-5 (flexion 1 and 2, abudction 1 and 2, and extension) OT Patient Instructions: Explained and demonstrated. pt return demonstrated. Handout provided (scanned). Consulted and Agree with Plan of Care: Patient  [place], [place], OTR/L [phone]  05/25/2014, 11:47 AM

## 2014-05-27 ENCOUNTER — Ambulatory Visit (HOSPITAL_COMMUNITY)
Admission: RE | Admit: 2014-05-27 | Discharge: 2014-05-27 | Disposition: A | Discharge status: Home / Self Care | Payer: Medicare Other | Source: Ambulatory Visit | Attending: Family Medicine | Admitting: Family Medicine

## 2014-05-27 DIAGNOSIS — I10 Essential (primary) hypertension: Secondary | ICD-10-CM | POA: Diagnosis not present

## 2014-05-27 DIAGNOSIS — R609 Edema, unspecified: Secondary | ICD-10-CM | POA: Diagnosis not present

## 2014-05-27 DIAGNOSIS — IMO0001 Reserved for inherently not codable concepts without codable children: Secondary | ICD-10-CM | POA: Diagnosis not present

## 2014-05-27 DIAGNOSIS — E785 Hyperlipidemia, unspecified: Secondary | ICD-10-CM | POA: Diagnosis not present

## 2014-05-27 DIAGNOSIS — R7301 Impaired fasting glucose: Secondary | ICD-10-CM | POA: Diagnosis not present

## 2014-05-27 DIAGNOSIS — Z6831 Body mass index (BMI) 31.0-31.9, adult: Secondary | ICD-10-CM | POA: Diagnosis not present

## 2014-05-27 NOTE — Progress Notes (Signed)
Occupational Therapy Treatment Patient Details  Name: Tracy Pratt MRN: [id number] Date of Birth: Oct 01, 1931  Today's Date: 05/27/2014 Time: 1015-1100 OT Time Calculation (min): 45 min MFR 2376-2831 10' Therex 1025-1100  35'  Visit#: 18 of 24  Re-eval: 06/15/14    Authorization: Medicare  Authorization Time Period: before 27th visit  Authorization Visit#: 18 of 27  Subjective Symptoms/Limitations Symptoms: S: I'm a little sore from last time but it's not pain.  Pain Assessment Currently in Pain?: Yes Pain Score: 1  Pain Location: Shoulder Pain Orientation: Left Pain Type: Acute pain  Precautions/Restrictions  Precautions Precautions: Shoulder Type of Shoulder Precautions: Progress as tolerated  Exercise/Treatments Supine Protraction: PROM;5 reps;Strengthening;10 reps Protraction Weight (lbs): 2 Horizontal ABduction: PROM;5 reps;Strengthening;10 reps Horizontal ABduction Weight (lbs): 2 External Rotation: PROM;5 reps;Strengthening;10 reps External Rotation Weight (lbs): 2 Internal Rotation: PROM;5 reps;Strengthening;10 reps Internal Rotation Weight (lbs): 2 Flexion: PROM;5 reps;Strengthening;10 reps Shoulder Flexion Weight (lbs): 2 ABduction: PROM;5 reps;Strengthening;10 reps Shoulder ABduction Weight (lbs): 2 Prone  Other Prone Exercises: Hughston exercises. flexion 1 and 2, horizontal abduction 1 and 2, extension; 10X ROM / Strengthening / Isometric Strengthening UBE (Upper Arm Bike): Level 2 2.5' forward 2.5' reverse Proximal Shoulder Strengthening, Supine: 15X with 2# Other ROM/Strengthening Exercises: Functional reaching using 10#, 5#, 3#, .5# plate weights. Pt placed plates in first level shelff using right UE to assist when weights increased.       Manual Therapy Manual Therapy: Myofascial release Myofascial Release: Myofascial release and manual stretching to left upper arm, trapezius, and scapularis region to decrease fascial restrictions and  increase joint mobility in a pain free zone  Occupational Therapy Assessment and Plan OT Assessment and Plan Clinical Impression Statement: A: Increased to 2# handweight with supine exercises. patient tolerated well. Increased to Level 2 with UBE. Patient has not had a chance to try additional Hughston exercises that were added last session. Will try them over the weekend.  OT Plan: P: Cont to work on functional reaching tasks. Increase supine reps as able. Progress to 2# in standing when able.    Goals Short Term Goals Time to Complete Short Term Goals: 6 weeks Short Term Goal 1: Patient will be educated on HEP. Short Term Goal 2: Patient will report a pain level of 3/10 with daily tasks. Short Term Goal 3: Patient will increase PROM to WNL to increase ability to get dressed. 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 Short Term Goal 5: Patient will decrease fascial restrictions to mod amount. Long Term Goals Time to Complete Long Term Goals: 12 weeks Long Term Goal 1: Patient will return to highest level of independence with all B/IADL and leisure activities.  Long Term Goal 1 Progress: Progressing toward goal Long Term Goal 2: Patient will report a pain score of 2/10 or less with daily tasks. 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 Long Term Goal 4 Progress: Progressing toward goal Long Term Goal 5: Patient will decrease amount of fascial restrictions in min amount.  Problem List Patient Active Problem List   Diagnosis Date Noted  . Pain in joint, shoulder region 03/23/2014  . Muscle  weakness (generalized) 03/23/2014  . Decreased  range of motion of left shoulder 03/23/2014  . Prostate cancer 10/31/2011  . Spondylolisthesis   . BPH (benign prostatic hyperplasia)   . Malignant hyperthermia   . Adrenal adenoma     End of Session Activity Tolerance: Patient tolerated treatment well General Behavior During Therapy: [facility] for tasks assessed/performed   [name], OTR/L,CBIS   05/27/2014, 11:00 AM

## 2014-06-07 DIAGNOSIS — Z4789 Encounter for other orthopedic aftercare: Secondary | ICD-10-CM | POA: Diagnosis not present

## 2014-06-08 ENCOUNTER — Ambulatory Visit (HOSPITAL_COMMUNITY)
Admission: RE | Admit: 2014-06-08 | Discharge: 2014-06-08 | Disposition: A | Discharge status: Home / Self Care | Payer: Medicare Other | Source: Ambulatory Visit | Attending: Orthopedic Surgery | Admitting: Orthopedic Surgery

## 2014-06-08 DIAGNOSIS — M25519 Pain in unspecified shoulder: Secondary | ICD-10-CM | POA: Insufficient documentation

## 2014-06-08 DIAGNOSIS — IMO0001 Reserved for inherently not codable concepts without codable children: Secondary | ICD-10-CM | POA: Diagnosis not present

## 2014-06-08 DIAGNOSIS — M6281 Muscle weakness (generalized): Secondary | ICD-10-CM | POA: Diagnosis not present

## 2014-06-08 DIAGNOSIS — M25619 Stiffness of unspecified shoulder, not elsewhere classified: Secondary | ICD-10-CM | POA: Diagnosis not present

## 2014-06-08 NOTE — Progress Notes (Signed)
Occupational Therapy Treatment Patient Details  Name: Rydan Gulyas MRN: [id number] Date of Birth: [birth year]  Today's Date: 06/08/2014 Time: 1020-1103 OT Time Calculation (min): 43 min MFR 1020-1030 10' Therex 8413-2440 33'  Visit#: 19 of 24  Re-eval: 06/15/14    Authorization: Medicare  Authorization Time Period: before 27th visit  Authorization Visit#: 19 of 27  Subjective Symptoms/Limitations Symptoms: S: [place] much [place] the [place].  Pain Assessment Currently in Pain?: No/denies  Precautions/Restrictions  Precautions Precautions: Shoulder Type of Shoulder Precautions: Progress as tolerated  Exercise/Treatments Supine Protraction: PROM;5 reps;Strengthening;15 reps Protraction Weight (lbs): 2 Horizontal ABduction: PROM;5 reps;Strengthening;15 reps Horizontal ABduction Weight (lbs): 2 External Rotation: PROM;5 reps;Strengthening;15 reps External Rotation Weight (lbs): 2 Internal Rotation: PROM;5 reps;Strengthening;15 reps Internal Rotation Weight (lbs): 2 Flexion: PROM;5 reps;Strengthening;15 reps Shoulder Flexion Weight (lbs): 2 ABduction: PROM;5 reps;Strengthening;15 reps Shoulder ABduction Weight (lbs): 2 Standing Protraction: Strengthening;15 reps Protraction Weight (lbs): 2 Horizontal ABduction: Strengthening;15 reps Horizontal ABduction Weight (lbs): 2 External Rotation: Strengthening;15 reps External Rotation Weight (lbs): 2 Internal Rotation: Strengthening;15 reps Internal Rotation Weight (lbs): 2 Flexion: Strengthening;15 reps Shoulder Flexion Weight (lbs): 2 ABduction: Strengthening;15 reps Shoulder ABduction Weight (lbs): 2 ROM / Strengthening / Isometric Strengthening UBE (Upper Arm Bike): Level 2 2.5' forward 2.5' reverse Proximal Shoulder Strengthening, Supine: 15X with 2#       Manual Therapy Manual Therapy: Myofascial release Myofascial Release: Myofascial release and manual stretching to left upper arm, trapezius,  and scapularis region to decrease fascial restrictions and increase joint mobility in a pain free zone  Occupational Therapy Assessment and Plan OT Assessment and Plan Clinical Impression Statement: A: Increased to 2# standing this session. Patient tolerated well. Pt did have increased difficulty with muscle fatigue and was able to complete all 15 reps with increased rest breaks (2-3 minutes). OT Plan: P: Cont to work on functional reaching tasks. Continue with missed exercises.    Goals Short Term Goals Time to Complete Short Term Goals: 6 weeks Short Term Goal 1: Patient will be educated on HEP. Short Term Goal 2: Patient will report a pain level of 3/10 with daily tasks. Short Term Goal 3: Patient will increase PROM to WNL to increase ability to get dressed. 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 Short Term Goal 5: Patient will decrease fascial restrictions to mod amount. Long Term Goals Time to Complete Long Term Goals: 12 weeks Long Term Goal 1: Patient will return to highest level of independence with all B/IADL and leisure activities.  Long Term Goal 1 Progress: Progressing toward goal Long Term Goal 2: Patient will report a pain score of 2/10 or less with daily tasks. 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 Long Term Goal 4 Progress: Progressing toward goal Long Term Goal 5: Patient will decrease amount of fascial restrictions in min amount.  Problem List Patient Active Problem List   Diagnosis Date Noted  . Pain in joint, shoulder region 03/23/2014  . Muscle weakness (generalized) 03/23/2014  . Decreased range of motion of  left shoulder 03/23/2014  . Prostate cancer 10/31/2011  . Spondylolisthesis   . BPH (  benign prostatic hyperplasia)   . Malignant hyperthermia   . Adrenal adenoma     End of Session Activity Tolerance: Patient tolerated treatment well General Behavior During Therapy: [facility] for tasks assessed/performed   [name], OTR/L,CBIS   06/08/2014, 11:03 AM

## 2014-06-10 ENCOUNTER — Ambulatory Visit (HOSPITAL_COMMUNITY)
Admission: RE | Admit: 2014-06-10 | Discharge: 2014-06-10 | Disposition: A | Discharge status: Home / Self Care | Payer: Medicare Other | Source: Ambulatory Visit | Attending: Orthopedic Surgery | Admitting: Orthopedic Surgery

## 2014-06-10 DIAGNOSIS — M25519 Pain in unspecified shoulder: Secondary | ICD-10-CM | POA: Diagnosis not present

## 2014-06-10 DIAGNOSIS — IMO0001 Reserved for inherently not codable concepts without codable children: Secondary | ICD-10-CM | POA: Diagnosis not present

## 2014-06-10 DIAGNOSIS — M25619 Stiffness of unspecified shoulder, not elsewhere classified: Secondary | ICD-10-CM | POA: Diagnosis not present

## 2014-06-10 DIAGNOSIS — M6281 Muscle weakness (generalized): Secondary | ICD-10-CM | POA: Diagnosis not present

## 2014-06-10 NOTE — Progress Notes (Signed)
Occupational Therapy Treatment Patient Details  Name: Abdulai Blaylock MRN: [id number] Date of Birth: 07-24-1931  Today's Date: 06/10/2014 Time: 1020-1105 OT Time Calculation (min): 45 min MFR 1020-1030 10' Therex 1030-1105 35'  Visit#: 20 of 24  Re-eval: 06/15/14    Authorization: Medicare  Authorization Time Period: before 27th visit  Authorization Visit#: 20 of 27  Subjective Symptoms/Limitations Symptoms: S: My shoulder got a good workout last time.  Pain Assessment Currently in Pain?: No/denies  Precautions/Restrictions  Precautions Precautions: Shoulder Type of Shoulder Precautions: Progress as tolerated  Exercise/Treatments Supine Protraction: PROM;5 reps;Strengthening;15 reps Protraction Weight (lbs): 2 Horizontal ABduction: PROM;5 reps;Strengthening;15 reps Horizontal ABduction Weight (lbs): 2 External Rotation: PROM;5 reps;Strengthening;15 reps External Rotation Weight (lbs): 2 Internal Rotation: PROM;5 reps;Strengthening;15 reps Internal Rotation Weight (lbs): 2 Flexion: PROM;5 reps;Strengthening;15 reps Shoulder Flexion Weight (lbs): 2 ABduction: PROM;5 reps;Strengthening;15 reps Shoulder ABduction Weight (lbs): 2 Standing Protraction: Strengthening;15 reps Protraction Weight (lbs): 2 Horizontal ABduction: Strengthening;15 reps Horizontal ABduction Weight (lbs): 2 External Rotation: Strengthening;15 reps External Rotation Weight (lbs): 2 Internal Rotation: Strengthening;15 reps Internal Rotation Weight (lbs): 2 Flexion: Strengthening;15 reps Shoulder Flexion Weight (lbs): 2 ABduction: Strengthening;15 reps Shoulder ABduction Weight (lbs): 2 ROM / Strengthening / Isometric Strengthening UBE (Upper Arm Bike): Level 2 2.5' forward 2.5' reverse "W" Arms: 10X  X to V Arms: 10X Proximal Shoulder Strengthening, Supine: 15X with 2# Proximal Shoulder Strengthening, Seated: 10X with 2#       Manual Therapy Manual Therapy: Myofascial  release Myofascial Release: Myofascial release and manual stretching to left upper arm, trapezius, and scapularis region to decrease fascial restrictions and increase joint mobility in a pain free zone  Occupational Therapy Assessment and Plan OT Assessment and Plan Clinical Impression Statement: A: Added X to V arms, and W arms without weight. Patient had difficulty completing all standing abduction exercise using 2# weight. Was able to complete 5 reps and dropped weight to complete last 5 reps.  OT Plan: P: Reassess   Goals Short Term Goals Time to Complete Short Term Goals: 6 weeks Short Term Goal 1: Patient will be educated on HEP. Short Term Goal 2: Patient will report a pain level of 3/10 with daily tasks. Short Term Goal 3: Patient will increase PROM to WNL to increase ability to get dressed. 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 Short Term Goal 5: Patient will decrease fascial restrictions to mod amount. Long Term Goals Time to Complete Long Term Goals: 12 weeks Long Term Goal 1: Patient will return to highest level of independence with all B/IADL and leisure activities.  Long Term Goal 2: Patient will report a pain score of 2/10 or less with daily tasks. 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 Long Term Goal 4: Patient will increase left shoulder strength to 4-/5 to increase ability to lift boxes of light food at food pantry. Long Term Goal 5: Patient will decrease amount of fascial restrictions in min amount.  Problem List Patient Active Problem List   Diagnosis Date Noted  . Pain in joint, shoulder region 03/23/2014  . Muscle weakness (generalized) 03/23/2014  . Decreased range of motion of left shoulder 03/23/2014  . Prostate cancer 10/31/2011  . Spondylolisthesis   . BPH (benign prostatic  hyperplasia)   . Malignant hyperthermia   . Adrenal adenoma  End of Session Activity Tolerance: Patient tolerated treatment well General Behavior During Therapy: [facility] for tasks assessed/performed   [name], OTR/L,CBIS   06/10/2014, 11:40 AM

## 2014-06-15 ENCOUNTER — Ambulatory Visit (HOSPITAL_COMMUNITY)
Admission: RE | Admit: 2014-06-15 | Discharge: 2014-06-15 | Disposition: A | Discharge status: Home / Self Care | Payer: Medicare Other | Source: Ambulatory Visit | Attending: Family Medicine | Admitting: Family Medicine

## 2014-06-15 DIAGNOSIS — IMO0001 Reserved for inherently not codable concepts without codable children: Secondary | ICD-10-CM | POA: Diagnosis not present

## 2014-06-15 DIAGNOSIS — M25519 Pain in unspecified shoulder: Secondary | ICD-10-CM | POA: Diagnosis not present

## 2014-06-15 DIAGNOSIS — M25619 Stiffness of unspecified shoulder, not elsewhere classified: Secondary | ICD-10-CM | POA: Diagnosis not present

## 2014-06-15 DIAGNOSIS — M6281 Muscle weakness (generalized): Secondary | ICD-10-CM | POA: Diagnosis not present

## 2014-06-15 NOTE — Evaluation (Signed)
Occupational Therapy Re-Evaluation and Discharge  Patient Details  Name: Tracy Pratt MRN: [id number] Date of Birth: Mar 23, 1931  Today's Date: 06/15/2014 Time: 1017-1110 OT Time Calculation (min): 53 min Manual 1017-1027 (10') MMT 1027-1037 (10') Self Care 1037-1110 (52')  Visit#: 21 of 24  Re-eval:    Assessment Diagnosis: s/p left rotator cuff tear repair  Authorization: Medicare  Authorization Time Period: before 27th visit  Authorization Visit#: 21 of 23   Past Medical History:  Past Medical History  Diagnosis Date  . BPH (benign prostatic hyperplasia)   . Malignant hyperthermia   . Adrenal adenoma     left  . Allergy   . Cataract 2007&2009    both   . Arthritis     back,hip  . Hypertension   . Spondylolisthesis     l5-s1  . Prostate cancer 08/08/2007    seed implant and external rad tx   Past Surgical History:  Past Surgical History  Procedure Laterality Date  . Transurethral resection of prostate  1997  . Cataract extraction  2007  . Corneal transplant  2007  . Knee surgery  1996    left knee arthoscopic  . Back surgery  1979  . Torn meniscus left knee  1996    Subjective Symptoms/Limitations Symptoms: S: I think I can do more with my left arm. 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 i can get up higher with it." Pertinent History: 03/02/14 Tracy Pratt had rotator cuff repair and bone spurs removed from left shoulder. Patient states that he had therapy approx. 9 years ago on shoulder when it wasn't bad enough to have surgery. Patient is to wear sling 24/7 and is allowed to remove for bathing and dressing. Tracy Pratt has referred patient to occupational therapy for evaluation and treatment,. Pain Assessment Currently in Pain?: Yes Pain Score: 1  Pain Location: Shoulder Pain Orientation: Right Pain Type: Acute pain  Precautions/Restrictions  Precautions Precautions:  Shoulder Type of Shoulder Precautions: Progress as tolerated  Assessment ADL/Vision/Perception ADL ADL Comments: using BUE for all ADL tasks. including donning/doffing shoes/socks Additional Assessments LUE AROM (degrees) LUE Overall AROM Comments: Assessed  Left Shoulder Flexion: 138 Degrees (122) Left Shoulder ABduction: 140 Degrees (126) Left Shoulder Internal Rotation: 90 Degrees (90) Left Shoulder External Rotation: 57 Degrees (55) LUE Strength Left Shoulder Flexion:  (4+/5 (4/5)) Left Shoulder ABduction:  (4+/5 (4-/5)) Left Shoulder Internal Rotation: 4/5 (3+/5) Left Shoulder External Rotation: 5/5 (4-/5) Palpation Palpation: Min fascial restrictions in left shoulder, trapezius, scapularis region.      Exercise/Treatments Supine Protraction: PROM;5 reps Horizontal ABduction: PROM;5 reps External Rotation: PROM;5 reps Internal Rotation: PROM;5 reps Flexion: PROM;5 reps ABduction: PROM;5 reps     Manual Therapy Manual Therapy: Myofascial release Myofascial Release: Myofascial release and manual stretching to left upper arm, trapezius, and scapularis region to decrease fascial restrictions and increase joint mobility in a pain free zone.    Occupational Therapy Assessment and Plan OT Assessment and Plan Clinical Impression Statement: Re-evaluation completed this session.  Pt has met all goals and verbalizes feeling comfortable with all needs for functional use of LUE.  ROM and strength is WFL, and pt is aware of all HEP exercises. OT Plan: Pt is discharged from OT services at this time.  Pt has contact information should any additional questions arise.   Goals Short Term Goals Short Term Goal 1: Patient will be  educated on HEP. Short Term Goal 1 Progress: Met Short Term Goal 2: Patient will report a pain level of 3/10 with daily tasks. Short Term Goal 2 Progress: Met Short Term Goal 3: Patient will increase PROM to WNL to increase ability to get dressed. 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 Short Term Goal 4 Progress: Met Short Term Goal 5: Patient will decrease fascial restrictions to mod amount. Short Term Goal 5 Progress: Met Long Term Goals Long Term Goal 1: Patient will return to highest level of independence with all B/IADL and leisure activities.  Long Term Goal 1 Progress: Met Long Term Goal 2: Patient will report a pain score of 2/10 or less with daily tasks. 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 Long Term Goal 3 Progress: Met Long Term Goal 4: Patient will increase left shoulder strength to 4-/5 to increase ability to lift boxes of light food at food pantry. Long Term Goal 4 Progress: Met Long Term Goal 5: Patient will decrease amount of fascial restrictions in min amount. Long Term Goal 5 Progress: Met  Problem List Patient Active Problem List   Diagnosis Date Noted  . Pain in joint, shoulder region 03/23/2014  . Muscle weakness (generalized) 03/23/2014  . Decreased range of motion of left shoulder 03/23/2014  . Prostate cancer 10/31/2011  . Spondylolisthesis   . BPH (benign prostatic hyperplasia)   . Malignant hyperthermia   . Adrenal adenoma     End of Session Activity Tolerance: Patient tolerated treatment well General Behavior During Therapy: WFL for tasks assessed/performed  GO Functional Assessment Tool Used: FOTO score: 71/100 (29% impaired) Functional Limitation: Carrying, moving and handling objects Carrying, Moving and Handling Objects Current Status ([id number]): At least 20 percent but less than 40 percent impaired, limited or restricted Carrying, Moving and Handling Objects Goal Status [phone]): At least 20 percent but less than 40 percent impaired, limited or restricted Carrying,  Moving and Handling Objects Discharge Status [id number]): At least 20 percent but less than 40 percent impaired, limited or restricted  Tracy Pratt, [place], OTR/L [phone]  06/15/2014, 12:23 PM   Physician Documentation Your signature is required to indicate approval of the treatment plan as stated above.  Please sign and either send electronically or make a copy of this report for your files and return this physician signed original.  Please mark one 1.__approve of plan  2. ___approve of plan with the following conditions.   ______________________________                                                          _____________________ Physician Signature  Date  

## 2014-06-17 ENCOUNTER — Ambulatory Visit (HOSPITAL_COMMUNITY): Payer: Medicare Other

## 2014-08-05 DIAGNOSIS — Z23 Encounter for immunization: Secondary | ICD-10-CM | POA: Diagnosis not present

## 2014-08-12 DIAGNOSIS — Z6831 Body mass index (BMI) 31.0-31.9, adult: Secondary | ICD-10-CM | POA: Diagnosis not present

## 2014-08-12 DIAGNOSIS — H6122 Impacted cerumen, left ear: Secondary | ICD-10-CM | POA: Diagnosis not present

## 2014-08-12 DIAGNOSIS — E6609 Other obesity due to excess calories: Secondary | ICD-10-CM | POA: Diagnosis not present

## 2014-09-06 DIAGNOSIS — H1851 Endothelial corneal dystrophy: Secondary | ICD-10-CM | POA: Diagnosis not present

## 2014-09-20 DIAGNOSIS — Z4789 Encounter for other orthopedic aftercare: Secondary | ICD-10-CM | POA: Diagnosis not present

## 2014-09-25 ENCOUNTER — Emergency Department (HOSPITAL_COMMUNITY)
Admission: EM | Admit: 2014-09-25 | Discharge: 2014-09-25 | Disposition: A | Discharge status: Home / Self Care | Payer: Medicare Other | Attending: Emergency Medicine | Admitting: Emergency Medicine

## 2014-09-25 ENCOUNTER — Encounter (HOSPITAL_COMMUNITY): Payer: Self-pay | Admitting: Emergency Medicine

## 2014-09-25 ENCOUNTER — Emergency Department (HOSPITAL_COMMUNITY): Payer: Medicare Other

## 2014-09-25 DIAGNOSIS — I10 Essential (primary) hypertension: Secondary | ICD-10-CM | POA: Insufficient documentation

## 2014-09-25 DIAGNOSIS — M1990 Unspecified osteoarthritis, unspecified site: Secondary | ICD-10-CM | POA: Insufficient documentation

## 2014-09-25 DIAGNOSIS — N508 Other specified disorders of male genital organs: Secondary | ICD-10-CM | POA: Diagnosis not present

## 2014-09-25 DIAGNOSIS — Z86018 Personal history of other benign neoplasm: Secondary | ICD-10-CM | POA: Diagnosis not present

## 2014-09-25 DIAGNOSIS — H269 Unspecified cataract: Secondary | ICD-10-CM | POA: Insufficient documentation

## 2014-09-25 DIAGNOSIS — N5089 Other specified disorders of the male genital organs: Secondary | ICD-10-CM

## 2014-09-25 DIAGNOSIS — N451 Epididymitis: Secondary | ICD-10-CM | POA: Insufficient documentation

## 2014-09-25 DIAGNOSIS — Z79899 Other long term (current) drug therapy: Secondary | ICD-10-CM | POA: Diagnosis not present

## 2014-09-25 DIAGNOSIS — Z8546 Personal history of malignant neoplasm of prostate: Secondary | ICD-10-CM | POA: Diagnosis not present

## 2014-09-25 DIAGNOSIS — N433 Hydrocele, unspecified: Secondary | ICD-10-CM | POA: Diagnosis not present

## 2014-09-25 DIAGNOSIS — R2240 Localized swelling, mass and lump, unspecified lower limb: Secondary | ICD-10-CM | POA: Diagnosis present

## 2014-09-25 DIAGNOSIS — Z87891 Personal history of nicotine dependence: Secondary | ICD-10-CM | POA: Diagnosis not present

## 2014-09-25 DIAGNOSIS — Z9889 Other specified postprocedural states: Secondary | ICD-10-CM | POA: Insufficient documentation

## 2014-09-25 LAB — URINALYSIS, ROUTINE W REFLEX MICROSCOPIC
Bilirubin Urine: NEGATIVE
GLUCOSE, UA: NEGATIVE mg/dL
KETONES UR: NEGATIVE mg/dL
NITRITE: NEGATIVE
PROTEIN: NEGATIVE mg/dL
Specific Gravity, Urine: 1.004 — ABNORMAL LOW (ref 1.005–1.030)
Urobilinogen, UA: 0.2 mg/dL (ref 0.0–1.0)
pH: 5.5 (ref 5.0–8.0)

## 2014-09-25 LAB — URINE MICROSCOPIC-ADD ON

## 2014-09-25 MED ORDER — HYDROCODONE-ACETAMINOPHEN 5-325 MG PO TABS
1.0000 | ORAL_TABLET | Freq: Once | ORAL | Status: AC
  Administered 2014-09-25: 1 via ORAL
  Filled 2014-09-25: qty 1

## 2014-09-25 MED ORDER — OXYCODONE-ACETAMINOPHEN 5-325 MG PO TABS: 1.0000 | ORAL_TABLET | Freq: Once | ORAL | Status: DC

## 2014-09-25 MED ORDER — LEVOFLOXACIN 500 MG PO TABS: 500.0000 mg | ORAL_TABLET | Freq: Every day | ORAL | Status: DC

## 2014-09-25 MED ORDER — LEVOFLOXACIN 500 MG PO TABS
500.0000 mg | ORAL_TABLET | Freq: Once | ORAL | Status: AC
  Administered 2014-09-25: 500 mg via ORAL
  Filled 2014-09-25: qty 1

## 2014-09-25 NOTE — ED Notes (Signed)
Pt using the bathroom

## 2014-09-25 NOTE — ED Notes (Signed)
Dr. Ward at bedside.

## 2014-09-25 NOTE — ED Notes (Signed)
Pt daughter expressed concern that 1 Vicodin may not be enough pain meds to tolerate the [place] and requests that pt receive 2 instead. Notified Dr Leonides Schanz.

## 2014-09-25 NOTE — ED Provider Notes (Signed)
TIME SEEN: 3:17 PM  CHIEF COMPLAINT: Right testicular swelling, pain  HPI: Pt is a 80 y.o. M with history of BPH, hypertension, prostate cancer in 2008 who presents to the emergency department with complaints of right testicular swelling for the past 3-4 days. Denies any injury to the area. No dysuria or hematuria. He is not sexually active. No penile discharge. No fevers, chills, nausea, vomiting or diarrhea.  ROS: See HPI Constitutional: no fever  Eyes: no drainage  ENT: no runny nose   Cardiovascular:  no chest pain  Resp: no SOB  GI: no vomiting GU: no dysuria Integumentary: no rash  Allergy: no hives  Musculoskeletal: no leg swelling  Neurological: no slurred speech ROS otherwise negative  PAST MEDICAL HISTORY/PAST SURGICAL HISTORY:  Past Medical History  Diagnosis Date  . BPH (benign prostatic hyperplasia)   . Malignant hyperthermia   . Adrenal adenoma     left  . Allergy   . Cataract 2007&2009    both   . Arthritis     back,hip  . Hypertension   . Spondylolisthesis     l5-s1  . Prostate cancer 08/08/2007    seed implant and external rad tx    MEDICATIONS:  Prior to Admission medications   Medication Sig Start Date End Date Taking? Authorizing Provider  ciprofloxacin (CIPRO) 500 MG tablet Take 500 mg by mouth 2 (two) times daily as needed (for UTI). 09/22/14  Yes Historical Provider, MD  furosemide (LASIX) 20 MG tablet Take 20 mg by mouth daily as needed for fluid.   Yes Historical Provider, MD  HYDROcodone-acetaminophen (NORCO) 5-325 MG per tablet Take 1 tablet by mouth every 6 (six) hours as needed for moderate pain.    Yes Historical Provider, MD  lisinopril (PRINIVIL,ZESTRIL) 10 MG tablet Take 10 mg by mouth daily with breakfast.   Yes Historical Provider, MD  loteprednol (LOTEMAX) 0.5 % ophthalmic suspension Place 1 drop into both eyes at bedtime.    Yes Historical Provider, MD    ALLERGIES:  Allergies  Allergen Reactions  . Nsaids Anaphylaxis  .  Desflurane     Malignant hyperthermia  . Enflurane     Malignant hyperthermia  . Halothane     Malignant hyperthermia  . Isoflurane     Malignant hyperthermia  . Sevoflurane     Malignant hyperthermia  . Succinylcholine     Malignant hyperthermia    SOCIAL HISTORY:  History  Substance Use Topics  . Smoking status: Former Smoker -- 1.00 packs/day for 15 years    Types: Cigarettes  . Smokeless tobacco: Not on file     Comment: quit 28 years ago  . Alcohol Use: No    FAMILY HISTORY: Family History  Problem Relation Age of Onset  . Prostate cancer Brother   . Lung disease Brother     lobectomy    EXAM: BP 136/85 mmHg  Pulse 62  Temp(Src) 97.5 F (36.4 C) (Oral)  Resp 16  SpO2 100% CONSTITUTIONAL: Alert and oriented and responds appropriately to questions. Well-appearing; well-nourished HEAD: Normocephalic EYES: Conjunctivae clear, PERRL ENT: normal nose; no rhinorrhea; moist mucous membranes; pharynx without lesions noted NECK: Supple, no meningismus, no LAD  CARD: RRR; S1 and S2 appreciated; no murmurs, no clicks, no rubs, no gallops RESP: Normal chest excursion without splinting or tachypnea; breath sounds clear and equal bilaterally; no wheezes, no rhonchi, no rales,  ABD/GI: Normal bowel sounds; non-distended; soft, non-tender, no rebound, no guarding GU:  Patient has right testicular pain  and swelling, no high riding testicle, no perineal warmth or erythema or crepitus, patient is uncircumcised, no penile discharge, normal urethral meatus, no paraphimosis or phimosis, normal penile shaft, no genital lesions  BACK:  The back appears normal and is non-tender to palpation, there is no CVA tenderness EXT: Normal ROM in all joints; non-tender to palpation; no edema; normal capillary refill; no cyanosis    SKIN: Normal color for age and race; warm NEURO: Moves all extremities equally PSYCH: The patient's mood and manner are appropriate. Grooming and personal hygiene are  appropriate.  MEDICAL DECISION MAKING: Pt here with right testicular pain. Differential diagnosis includes epididymitis, torsion, hernia, UTI. Will obtain scrotal ultrasound with Doppler, urine. Will give pain medication and reassess.  ED PROGRESS: Patient's urine shows large hemoglobin, large leukocytes but rare bacteria. Culture is pending. Scrotal ultrasound shows right-sided epididymitis, bilateral hydroceles and bilateral epididymal cysts that have both increased since the prior study. No torsion.  He takes Vicodin at home intermittently for chronic pain which she can take at home for his epididymitis. We'll discharge with prescription for Levaquin 500 mg for 10 days. Discussed return precautions and supportive care instructions. Patient verbalizes understanding and is comfortable with plan.     [place], DO 09/25/14 [phone]

## 2014-09-25 NOTE — ED Notes (Signed)
Pt w/ hx of prostate cancer w/ seed implants (2008).  Pt states that on Wednesday, he began having bilateral testicle swelling and pain.  States that his urologist office is closed and they told him that if he could not make it through the weekend to come to the ER.  States that he is "peeing OK".

## 2014-09-25 NOTE — ED Notes (Signed)
Pt c/o groin pain with swelling that started 3 days ago. Pt denies urinary s/sx. Pt had prostate CA in  2008. Pt denies SOB, CP and sts that his ankle swelling is chronic,- hx for CHF. Pt is A&O and in NAD

## 2014-09-25 NOTE — Discharge Instructions (Signed)
Epididymitis °Epididymitis is a swelling (inflammation) of the epididymis. The epididymis is a cord-like structure along the back part of the testicle. Epididymitis is usually, but not always, caused by infection. This is usually a sudden problem beginning with chills, fever and pain behind the scrotum and in the testicle. There may be swelling and redness of the testicle. °DIAGNOSIS  °Physical examination will reveal a tender, swollen epididymis. Sometimes, cultures are obtained from the urine or from prostate secretions to help find out if there is an infection or if the cause is a different problem. Sometimes, blood work is performed to see if your white blood cell count is elevated and if a germ (bacterial) or viral infection is present. Using this knowledge, an appropriate medicine which kills germs (antibiotic) can be chosen by your caregiver. A viral infection causing epididymitis will most often go away (resolve) without treatment. °HOME CARE INSTRUCTIONS  °· Hot sitz baths for 20 minutes, 4 times per day, may help relieve pain. °· Only take over-the-counter or prescription medicines for pain, discomfort or fever as directed by your caregiver. °· Take all medicines, including antibiotics, as directed. Take the antibiotics for the full prescribed length of time even if you are feeling better. °· It is very important to keep all follow-up appointments. °SEEK IMMEDIATE MEDICAL CARE IF:  °· You have a fever. °· You have pain not relieved with medicines. °· You have any worsening of your problems. °· Your pain seems to come and go. °· You develop pain, redness, and swelling in the scrotum and surrounding areas. °MAKE SURE YOU:  °· Understand these instructions. °· Will watch your condition. °· Will get help right away if you are not doing well or get worse. °Document Released: 10/12/2000 Document Revised: 01/07/2012 Document Reviewed: 09/01/2009 °ExitCare® Patient Information ©2015 [employer]. This information  is not intended to replace advice given to you by your health care provider. Make sure you discuss any questions you have with your health care provider. ° °

## 2014-09-27 LAB — URINE CULTURE
Colony Count: NO GROWTH
Culture: NO GROWTH

## 2014-10-13 DIAGNOSIS — D225 Melanocytic nevi of trunk: Secondary | ICD-10-CM | POA: Diagnosis not present

## 2014-10-13 DIAGNOSIS — L821 Other seborrheic keratosis: Secondary | ICD-10-CM | POA: Diagnosis not present

## 2014-10-13 DIAGNOSIS — Z08 Encounter for follow-up examination after completed treatment for malignant neoplasm: Secondary | ICD-10-CM | POA: Diagnosis not present

## 2014-10-13 DIAGNOSIS — Z85828 Personal history of other malignant neoplasm of skin: Secondary | ICD-10-CM | POA: Diagnosis not present

## 2014-10-13 DIAGNOSIS — L570 Actinic keratosis: Secondary | ICD-10-CM | POA: Diagnosis not present

## 2014-10-13 DIAGNOSIS — X32XXXD Exposure to sunlight, subsequent encounter: Secondary | ICD-10-CM | POA: Diagnosis not present

## 2014-11-10 DIAGNOSIS — N433 Hydrocele, unspecified: Secondary | ICD-10-CM | POA: Diagnosis not present

## 2014-11-10 DIAGNOSIS — N4340 Spermatocele of epididymis, unspecified: Secondary | ICD-10-CM | POA: Diagnosis not present

## 2014-11-10 DIAGNOSIS — N451 Epididymitis: Secondary | ICD-10-CM | POA: Diagnosis not present

## 2014-11-10 DIAGNOSIS — K4090 Unilateral inguinal hernia, without obstruction or gangrene, not specified as recurrent: Secondary | ICD-10-CM | POA: Diagnosis not present

## 2014-11-10 DIAGNOSIS — N419 Inflammatory disease of prostate, unspecified: Secondary | ICD-10-CM | POA: Diagnosis not present

## 2015-04-27 DIAGNOSIS — Z125 Encounter for screening for malignant neoplasm of prostate: Secondary | ICD-10-CM | POA: Diagnosis not present

## 2015-04-27 DIAGNOSIS — Z6831 Body mass index (BMI) 31.0-31.9, adult: Secondary | ICD-10-CM | POA: Diagnosis not present

## 2015-04-27 DIAGNOSIS — E6609 Other obesity due to excess calories: Secondary | ICD-10-CM | POA: Diagnosis not present

## 2015-04-27 DIAGNOSIS — Z1389 Encounter for screening for other disorder: Secondary | ICD-10-CM | POA: Diagnosis not present

## 2015-04-27 DIAGNOSIS — J301 Allergic rhinitis due to pollen: Secondary | ICD-10-CM | POA: Diagnosis not present

## 2015-05-24 DIAGNOSIS — Z8546 Personal history of malignant neoplasm of prostate: Secondary | ICD-10-CM | POA: Diagnosis not present

## 2015-08-26 DIAGNOSIS — Z23 Encounter for immunization: Secondary | ICD-10-CM | POA: Diagnosis not present

## 2015-09-20 DIAGNOSIS — H1851 Endothelial corneal dystrophy: Secondary | ICD-10-CM | POA: Diagnosis not present

## 2015-09-20 DIAGNOSIS — Z947 Corneal transplant status: Secondary | ICD-10-CM | POA: Diagnosis not present

## 2015-11-01 DIAGNOSIS — T883XXD Malignant hyperthermia due to anesthesia, subsequent encounter: Secondary | ICD-10-CM | POA: Diagnosis not present

## 2015-11-01 DIAGNOSIS — M25512 Pain in left shoulder: Secondary | ICD-10-CM | POA: Diagnosis not present

## 2015-11-01 DIAGNOSIS — M25552 Pain in left hip: Secondary | ICD-10-CM | POA: Diagnosis not present

## 2015-11-01 DIAGNOSIS — I10 Essential (primary) hypertension: Secondary | ICD-10-CM | POA: Diagnosis not present

## 2015-11-01 DIAGNOSIS — G8918 Other acute postprocedural pain: Secondary | ICD-10-CM | POA: Diagnosis not present

## 2015-11-01 DIAGNOSIS — M129 Arthropathy, unspecified: Secondary | ICD-10-CM | POA: Diagnosis not present

## 2015-11-01 DIAGNOSIS — Z6832 Body mass index (BMI) 32.0-32.9, adult: Secondary | ICD-10-CM | POA: Diagnosis not present

## 2015-11-07 ENCOUNTER — Ambulatory Visit (HOSPITAL_COMMUNITY): Payer: Medicare Other

## 2015-11-08 ENCOUNTER — Encounter (HOSPITAL_COMMUNITY): Payer: Self-pay | Admitting: Occupational Therapy

## 2015-11-08 ENCOUNTER — Ambulatory Visit (HOSPITAL_COMMUNITY): Payer: Medicare Other | Attending: Orthopaedic Surgery | Admitting: Occupational Therapy

## 2015-11-08 ENCOUNTER — Ambulatory Visit (HOSPITAL_COMMUNITY): Payer: Medicare Other | Admitting: Occupational Therapy

## 2015-11-08 DIAGNOSIS — M6281 Muscle weakness (generalized): Secondary | ICD-10-CM

## 2015-11-08 DIAGNOSIS — M629 Disorder of muscle, unspecified: Secondary | ICD-10-CM | POA: Insufficient documentation

## 2015-11-08 DIAGNOSIS — M25512 Pain in left shoulder: Secondary | ICD-10-CM | POA: Diagnosis not present

## 2015-11-08 DIAGNOSIS — M6289 Other specified disorders of muscle: Secondary | ICD-10-CM

## 2015-11-08 NOTE — Therapy (Signed)
[place], [place], [address] Phone: [phone]   Fax:  [phone]  Occupational Therapy Evaluation  Patient Details  Name: Tracy Pratt MRN: [id number] Date of Birth: February 22, 1931 Referring Provider: Dr. Luna Glasgow  Encounter Date: 11/08/2015      OT End of Session - 11/08/15 1104    Visit Number 1   Number of Visits 5   Date for OT Re-Evaluation 01/07/16  Mini reassessment 12/09/2015   Authorization Type Medicare/Medicare A & B   Authorization Time Period Before 10th visit   Authorization - Visit Number 1   Authorization - Number of Visits 10   OT Start Time [id number]   OT Stop Time 1047   OT Time Calculation (min) 32 min   Activity Tolerance Patient tolerated treatment well   Behavior During Therapy [facility] for tasks assessed/performed      Past Medical History  Diagnosis Date  . BPH (benign prostatic hyperplasia)   . Malignant hyperthermia   . Adrenal adenoma     left  . Allergy   . Cataract 2007&2009    both   . Arthritis     back,hip  . Hypertension   . Spondylolisthesis     l5-s1  . Prostate cancer ([place]) 08/08/2007    seed implant and external rad tx    Past Surgical History  Procedure Laterality Date  . Transurethral resection of prostate  1997  . Cataract extraction  2007  . Corneal transplant  2007  . Knee surgery  1996    left knee arthoscopic  . Back surgery  1979  . Torn meniscus left knee  1996    There were no vitals filed for this visit.  Visit Diagnosis:  Pain in left shoulder  Tight fascia  Muscle weakness of left upper extremity      Subjective Assessment - 11/08/15 1100    Subjective  S: I'm not having any pain in my shoulder, just tingling.    Pertinent History Pt is an 82 y/o male presenting with tingling in left shoulder present for approximately 1 month. Pt has been referred for pain in the left shoulder, however is not experiencing any pain at this time. He  believes the tingling began when "my collar bone popped when I was exercising one day." Dr. Luna Glasgow has referred pt to occupational therapy for evaluation and treatment.    Special Tests FOTO Score: 77/100 (23% impairment)   Patient Stated Goals for the tingling to stop.    Currently in Pain? No/denies           [facility] OT Assessment - 11/08/15 1012    Assessment   Diagnosis left shoulder pain   Referring Provider Dr. Luna Glasgow   Onset Date 10/07/16   Prior Therapy 2015   Precautions   Precautions None   Balance Screen   Has the patient fallen in the past 6 months No   Has the patient had a decrease in activity level because of a fear of falling?  No   Is the patient reluctant to leave their home because of a fear of falling?  No   Prior Function   Level of Independence Independent with basic ADLs   Vocation Retired   Leisure food pantry-lifting; yardwork   ADL   ADL comments Pt is not having any difficulty with ADL tasks, experiences tingling and some discomfort at times in left shoulder with repetitive use.    Written Expression  Dominant Hand Right   Cognition   Overall Cognitive Status Within Functional Limits for tasks assessed   ROM / Strength   AROM / PROM / Strength AROM;Strength;PROM   Palpation   Palpation comment Mod fascial restrictions in left upper arm, trapezius, and scapularis regions.    AROM   Overall AROM Comments Assessed in sitting, ER/IR adducted   AROM Assessment Site Shoulder   Right/Left Shoulder Left   Left Shoulder Flexion 150 Degrees   Left Shoulder ABduction 160 Degrees   Left Shoulder Internal Rotation 90 Degrees   Left Shoulder External Rotation 60 Degrees   PROM   Overall PROM Comments Assessed supine, ER/IR adducted    PROM Assessment Site Shoulder   Right/Left Shoulder Left   Left Shoulder Flexion 160 Degrees   Left Shoulder ABduction 180 Degrees   Left Shoulder Internal Rotation 90 Degrees   Left Shoulder External Rotation 90 Degrees    Strength   Overall Strength Comments Assessed seated, ER/IR adducted   Strength Assessment Site Shoulder   Right/Left Shoulder Left   Left Shoulder Flexion 4+/5   Left Shoulder ABduction 4+/5   Left Shoulder Internal Rotation 4/5   Left Shoulder External Rotation 4-/5                         OT Education - 11/08/15 1104    Education provided Yes   Education Details Scapular A/ROM exercises   Person(s) Educated Patient   Methods Explanation;Demonstration;Handout   Comprehension Verbalized understanding;Returned demonstration          OT Short Term Goals - 11/08/15 1109    OT SHORT TERM GOAL #1   Title Pt will return to highest level of functioning and independence in daily and volunteer tasks.    Time 4   Period Weeks   Status New   OT SHORT TERM GOAL #2   Title Pt will decrease fascial restrictions to trace amounts.    Time 4   Period Weeks   Status New   OT SHORT TERM GOAL #3   Title Pt will report no pain in the LUE during daily and volunteer tasks.    Time 4   Period Weeks   Status New   OT SHORT TERM GOAL #4   Title Pt will increase strength to 5/5 to increase ability to lift and carry heavy objects when working at [employer].    Time 4   Period Weeks   Status New   OT SHORT TERM GOAL #5   Title Pt will be educated on and independent  in HEP.    Time 4   Period Weeks   Status New                  Plan - 11/08/15 1105    Clinical Impression Statement A: Pt is an 82 y/o male presenting with tingling and discomfort in the LUE after experiencing a "popping" sensation approximately 1 month ago. 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 Pt reports MD prefers him to complete visits versus an HEP only.    Pt will benefit from skilled therapeutic intervention in order to improve on the following deficits (Retired) Decreased strength;Pain;Impaired UE functional use;Increased fascial  restricitons;Decreased range of motion;Impaired flexibility   Rehab Potential Good   OT Frequency 1x / week   OT Duration 4 weeks   OT Treatment/Interventions Self-care/ADL training;Passive range of motion;Patient/family education;Cryotherapy;Moist  Heat;Therapeutic exercise;Manual Therapy;Therapeutic activities   Plan P: Pt will benefit from skilled occupational therapy services to increase range of motion and strength, decrease pain and fascial restrictions in LUE. Treatment plan: myofascial release, manual stretching, A/ROM, scapular strengthening, proximal shoulder stability and strengthening, general LUE strengthening.    OT Home Exercise Plan scapular A/ROM exercises   Consulted and Agree with Plan of Care Patient          G-Codes - 12/05/15 1113    Functional Assessment Tool Used FOTO Score: 77/100 (23% impairment)   Functional Limitation Carrying, moving and handling objects   Carrying, Moving and Handling Objects Current Status [id number]) At least 20 percent but less than 40 percent impaired, limited or restricted   Carrying, Moving and Handling Objects Goal Status [id number]) At least 1 percent but less than 20 percent impaired, limited or restricted      Problem List Patient Active Problem List   Diagnosis Date Noted  . Pain in joint, shoulder region 03/23/2014  . Muscle weakness (generalized) 03/23/2014  . Decreased range of motion of left shoulder 03/23/2014  . Prostate cancer ([place]) 10/31/2011  . Spondylolisthesis   . BPH (benign prostatic hyperplasia)   . Malignant hyperthermia   . Adrenal adenoma     Guadelupe Sabin, OTR/L  [phone]  December 05, 2015, 11:14 AM  [place], [place], [address] Phone: [phone]   Fax:  [phone]  Name: Tracy Pratt MRN: [id number] Date of Birth: [birth year]

## 2015-11-08 NOTE — Patient Instructions (Signed)
1) Seated Row   Sit up straight with elbows by your sides. Pull back with shoulders/elbows, keeping forearms straight, as if pulling back on the reins of a horse. Squeeze shoulder blades together. Repeat _10-15__times, __2__sets/day    2) Shoulder Elevation    Sit up straight with arms by your sides. Slowly bring your shoulders up towards your ears. Repeat_10-15__times, __2__ sets/day    3) Shoulder Extension    Sit up straight with both arms by your side, draw your arms back behind your waist. Keep your elbows straight. Repeat _10-15___times, __2__sets/day.        

## 2015-11-11 ENCOUNTER — Ambulatory Visit (HOSPITAL_COMMUNITY): Payer: Medicare Other | Admitting: Occupational Therapy

## 2015-11-15 ENCOUNTER — Encounter (HOSPITAL_COMMUNITY): Payer: Self-pay

## 2015-11-15 ENCOUNTER — Ambulatory Visit (HOSPITAL_COMMUNITY): Payer: Medicare Other

## 2015-11-15 DIAGNOSIS — M6281 Muscle weakness (generalized): Secondary | ICD-10-CM

## 2015-11-15 DIAGNOSIS — M629 Disorder of muscle, unspecified: Secondary | ICD-10-CM

## 2015-11-15 DIAGNOSIS — M6289 Other specified disorders of muscle: Secondary | ICD-10-CM

## 2015-11-15 DIAGNOSIS — M25512 Pain in left shoulder: Secondary | ICD-10-CM | POA: Diagnosis not present

## 2015-11-15 NOTE — Patient Instructions (Signed)
STRETCH - Internal Rotation  Place your right / left hand behind your back, palm-up.  Throw a towel or belt over your opposite shoulder. Grasp the towel/belt with your right / left hand.  While keeping an upright posture, gently pull up on the towel/belt until you feel a stretch in the front of your right / leftshoulder.  Avoid shrugging your right / left shoulder as your arm rises by keeping your shoulder blade tucked down and toward your mid-back spine.  Hold right / left. Release the stretch by lowering your opposite hand.  Repeat __________ times. Complete this exercise __________ times per day.      STRETCH - Axial Extension  Stand or sit on a firm surface. Assume a good posture: chest up, shoulders drawn back, abdominal muscles slightly tense, knees unlocked (if standing) and feet hip width apart.  Slowly retract your chin so your head slides back and your chin slightly lowers. Continue to look straight ahead.  You should feel a gentle stretch in the back of your head. Be certain not to feel an aggressive stretch since this can cause headaches later.  Hold for __________ seconds. Repeat __________ times. Complete this exercise __________ times per day.   STRETCH - Cervical Side Bend  Stand or sit on a firm surface. Assume a good posture: chest up, shoulders drawn back, abdominal muscles slightly tense, knees unlocked (if standing) and feet hip-width apart.  Without letting your nose or shoulders move, slowly tip your right / left ear to your shoulder until your feel a gentle stretch in the muscles on the opposite side of your neck.  Hold __________ seconds.    STRETCH - Cervical Rotators  Stand or sit on a firm surface. Assume a good posture: chest up, shoulders drawn back, abdominal muscles slightly tense, knees unlocked (if standing) and feet hip-width apart.  Keeping your eyes level with the ground, slowly turn your head until you feel a gentle stretch along the back and  opposite side of your neck.  Hold __________ seconds. Repeat __________ times. Complete this exercise __________ times per day.    RANGE OF MOTION - Neck Circles  Stand or sit on a firm surface. Assume a good posture: chest up, shoulders drawn back, abdominal muscles slightly tense, knees unlocked (if standing) and feet hip-width apart.  Gently roll your head down and around from the back of one shoulder to the back of the other. The motion should never be forced or painful.  Repeat the motion 10-20 times, or until you feel the neck muscles relax and loosen. Repeat __________ times. Complete the exercise __________ times per day.

## 2015-11-15 NOTE — Therapy (Signed)
[place], [place], [address] Phone: [phone]   Fax:  [phone]  Occupational Therapy Treatment  Patient Details  Name: Tracy Pratt MRN: [id number] Date of Birth: 04-27-1931 Referring Provider: Dr. Luna Glasgow  Encounter Date: 11/15/2015      OT End of Session - 11/15/15 1215    Visit Number 2   Number of Visits 5   Date for OT Re-Evaluation 01/07/16  Mini reassessment 12/09/2015   Authorization Type Medicare/Medicare A & B   Authorization Time Period Before 10th visit   Authorization - Visit Number 2   Authorization - Number of Visits 10   OT Start Time 1050   OT Stop Time 1155   OT Time Calculation (min) 65 min   Activity Tolerance Patient tolerated treatment well   Behavior During Therapy [facility] for tasks assessed/performed      Past Medical History  Diagnosis Date  . BPH (benign prostatic hyperplasia)   . Malignant hyperthermia   . Adrenal adenoma     left  . Allergy   . Cataract 2007&2009    both   . Arthritis     back,hip  . Hypertension   . Spondylolisthesis     l5-s1  . Prostate cancer ([place]) 08/08/2007    seed implant and external rad tx    Past Surgical History  Procedure Laterality Date  . Transurethral resection of prostate  1997  . Cataract extraction  2007  . Corneal transplant  2007  . Knee surgery  1996    left knee arthoscopic  . Back surgery  1979  . Torn meniscus left knee  1996    There were no vitals filed for this visit.  Visit Diagnosis:  Tight fascia  Muscle weakness of left upper extremity  Muscle weakness (generalized)      Subjective Assessment - 11/15/15 1132    Subjective  S: I feel the tingling all the time. Especially when I look up all the way when I'm putting my eye drops in.   Currently in Pain? No/denies                      OT Treatments/Exercises (OP) - 11/15/15 1136    Exercises   Exercises Shoulder;Neck   Neck  Exercises: Seated   Neck Retraction 10 secs  3X   Cervical Rotation Both  1X; 15 seconds   Lateral Flexion Both  3X; 15 seconds   Shoulder Exercises: Supine   Flexion Strengthening;12 reps   Theraband Level (Shoulder Flexion) Level 3 (Green)   Shoulder Flexion Weight (lbs) 2   Other Supine Exercises Serratus anterior punch; 12X   Shoulder Exercises: Sidelying   External Rotation Strengthening;12 reps   External Rotation Weight (lbs) 2   Internal Rotation Strengthening;12 reps   Internal Rotation Weight (lbs) 2   Flexion Strengthening;12 reps   Flexion Weight (lbs) 2   ABduction Strengthening;12 reps   ABduction Weight (lbs) 2   Manual Therapy   Manual Therapy Myofascial release;Muscle Energy Technique;Manual Traction   Manual therapy comments Manual therapy completed prior to exercises.   Myofascial Release Myofascial release and manual stretching to Left upper arm, trapezius, and scapularis region.    Manual Traction Manual traction completed to LUE.   Muscle Energy Technique Muscle energy technique completed to LUE to decrease muscle spasms and increase ROM.                 OT  Education - 11/15/15 1215    Education provided Yes   Education Details cervical ROM and IR stretch. Pt given print out of OT evaluation and reviewed goals and plan of care reviewed.    Person(s) Educated Patient   Methods Explanation;Demonstration;Verbal cues;Handout   Comprehension Returned demonstration;Verbalized understanding          OT Short Term Goals - 11/15/15 1227    OT SHORT TERM GOAL #1   Title Pt will return to highest level of functioning and independence in daily and volunteer tasks.    Time 4   Period Weeks   Status On-going   OT SHORT TERM GOAL #2   Title Pt will decrease fascial restrictions to trace amounts.    Time 4   Period Weeks   Status On-going   OT SHORT TERM GOAL #3   Title Pt will report no pain in the LUE during daily and volunteer tasks.    Time 4    Period Weeks   Status On-going   OT SHORT TERM GOAL #4   Title Pt will increase strength to 5/5 to increase ability to lift and carry heavy objects when working at [employer].    Time 4   Period Weeks   Status On-going   OT SHORT TERM GOAL #5   Title Pt will be educated on and independent  in HEP.    Time 4   Period Weeks   Status On-going                  Plan - 11/15/15 1216    Clinical Impression Statement A: Initiated myofascial release, manual stretching and traction, rotator cuff strengthening exercises and cervical ROM. I'm almost wondering if patient may have a subscapularis nerve entrapment based on his reports of tingling in the anterior portion of her shoulder. It wouldn't hurt to further look into possible causes of tingling.    Plan P: scapular raises prone; cervical isometrics, scapular theraband strengthening (including external rotators). Working Gaffer overhead.        Problem List Patient Active Problem List   Diagnosis Date Noted  . Pain in joint, shoulder region 03/23/2014  . Muscle weakness (generalized) 03/23/2014  . Decreased range of motion of left shoulder 03/23/2014  . Prostate cancer ([place]) 10/31/2011  . Spondylolisthesis   . BPH (benign prostatic hyperplasia)   . Malignant hyperthermia   . Adrenal adenoma     [name], OTR/L,CBIS  [phone]  11/15/2015, 12:28 PM  [place] [address] [place], [place], [address] Phone: [phone]   Fax:  [phone]  Name: Tracy Pratt MRN: [id number] Date of Birth: [birth year]

## 2015-11-21 ENCOUNTER — Encounter (HOSPITAL_COMMUNITY): Payer: Self-pay

## 2015-11-21 ENCOUNTER — Ambulatory Visit (HOSPITAL_COMMUNITY): Payer: Medicare Other

## 2015-11-21 DIAGNOSIS — M629 Disorder of muscle, unspecified: Secondary | ICD-10-CM

## 2015-11-21 DIAGNOSIS — M6289 Other specified disorders of muscle: Secondary | ICD-10-CM

## 2015-11-21 DIAGNOSIS — M6281 Muscle weakness (generalized): Secondary | ICD-10-CM

## 2015-11-21 DIAGNOSIS — M25512 Pain in left shoulder: Secondary | ICD-10-CM | POA: Diagnosis not present

## 2015-11-21 NOTE — Therapy (Signed)
[place], [place], [address] Phone: [phone]   Fax:  [phone]  Occupational Therapy Treatment  Patient Details  Name: Tracy Pratt MRN: [id number] Date of Birth: April 11, 1931 Referring Provider: Dr. Luna Glasgow  Encounter Date: 11/21/2015      OT End of Session - 11/21/15 1228    Visit Number 3   Number of Visits 5   Date for OT Re-Evaluation 01/07/16  Mini reassessment 12/09/2015   Authorization Type Medicare/Medicare A & B   Authorization Time Period Before 10th visit   Authorization - Visit Number 3   Authorization - Number of Visits 10   OT Start Time 1017   OT Stop Time 1100   OT Time Calculation (min) 43 min   Activity Tolerance Patient tolerated treatment well   Behavior During Therapy [facility] for tasks assessed/performed      Past Medical History  Diagnosis Date  . BPH (benign prostatic hyperplasia)   . Malignant hyperthermia   . Adrenal adenoma     left  . Allergy   . Cataract 2007&2009    both   . Arthritis     back,hip  . Hypertension   . Spondylolisthesis     l5-s1  . Prostate cancer ([place]) 08/08/2007    seed implant and external rad tx    Past Surgical History  Procedure Laterality Date  . Transurethral resection of prostate  1997  . Cataract extraction  2007  . Corneal transplant  2007  . Knee surgery  1996    left knee arthoscopic  . Back surgery  1979  . Torn meniscus left knee  1996    There were no vitals filed for this visit.  Visit Diagnosis:  Tight fascia  Muscle weakness of left upper extremity      Subjective Assessment - 11/21/15 1226    Subjective  S: The tingling is not as bad. It might be getting better.    Currently in Pain? No/denies                      OT Treatments/Exercises (OP) - 11/21/15 1039    Exercises   Exercises Shoulder;Neck   Neck Exercises: Standing   Other Standing Exercises Isometrics against wall; flexors, lateral  flexors (bilateral), extensors; 3x30"   Shoulder Exercises: Supine   Protraction PROM;5 reps   Horizontal ABduction PROM;5 reps   External Rotation PROM;5 reps   Internal Rotation PROM;5 reps   Flexion PROM;5 reps   ABduction PROM;5 reps   Shoulder Exercises: Sidelying   External Rotation Strengthening;15 reps   External Rotation Weight (lbs) 3   Internal Rotation Strengthening;15 reps   Internal Rotation Weight (lbs) 3   Flexion Strengthening;15 reps   Flexion Weight (lbs) 3   ABduction Strengthening;15 reps   ABduction Weight (lbs) 3   Shoulder Exercises: Standing   External Rotation Theraband;15 reps   Theraband Level (Shoulder External Rotation) Level 2 (Red)   Shoulder Exercises: Isometric Strengthening   External Rotation --  Standing 3x30" using wall   Manual Therapy   Manual Therapy Myofascial release   Manual therapy comments Manual therapy completed prior to exercises.   Myofascial Release Myofascial release and manual stretching to Left upper arm, trapezius, and scapularis region.                   OT Short Term Goals - 11/15/15 1227    OT SHORT TERM GOAL #1  Title Pt will return to highest level of functioning and independence in daily and volunteer tasks.    Time 4   Period Weeks   Status On-going   OT SHORT TERM GOAL #2   Title Pt will decrease fascial restrictions to trace amounts.    Time 4   Period Weeks   Status On-going   OT SHORT TERM GOAL #3   Title Pt will report no pain in the LUE during daily and volunteer tasks.    Time 4   Period Weeks   Status On-going   OT SHORT TERM GOAL #4   Title Pt will increase strength to 5/5 to increase ability to lift and carry heavy objects when working at [employer].    Time 4   Period Weeks   Status On-going   OT SHORT TERM GOAL #5   Title Pt will be educated on and independent  in HEP.    Time 4   Period Weeks   Status On-going                  Plan - 11/21/15 1228     Clinical Impression Statement A: Added cervical isometrics to work on increasing cervical muscles. Pt required VC for form and technique.   Plan P: Scapular raises prone, scapular theraband strengthening. Work Actuary. Update HEP.        Problem List Patient Active Problem List   Diagnosis Date Noted  . Pain in joint, shoulder region 03/23/2014  . Muscle weakness (generalized) 03/23/2014  . Decreased range of motion of left shoulder 03/23/2014  . Prostate cancer ([place]) 10/31/2011  . Spondylolisthesis   . BPH (benign prostatic hyperplasia)   . Malignant hyperthermia   . Adrenal adenoma     Tracy Pratt, OTR/L,CBIS  [phone]  11/21/2015, 12:30 PM  [facility] [address] [place], [place], [address] Phone: [phone]   Fax:  [phone]  Name: Tracy Pratt MRN: [id number] Date of Birth: 1930-12-03

## 2015-11-22 ENCOUNTER — Encounter (HOSPITAL_COMMUNITY): Payer: Medicare Other | Admitting: Occupational Therapy

## 2015-11-22 DIAGNOSIS — Z6832 Body mass index (BMI) 32.0-32.9, adult: Secondary | ICD-10-CM | POA: Diagnosis not present

## 2015-11-22 DIAGNOSIS — I10 Essential (primary) hypertension: Secondary | ICD-10-CM | POA: Diagnosis not present

## 2015-11-22 DIAGNOSIS — M129 Arthropathy, unspecified: Secondary | ICD-10-CM | POA: Diagnosis not present

## 2015-11-22 DIAGNOSIS — T883XXD Malignant hyperthermia due to anesthesia, subsequent encounter: Secondary | ICD-10-CM | POA: Diagnosis not present

## 2015-11-22 DIAGNOSIS — G8918 Other acute postprocedural pain: Secondary | ICD-10-CM | POA: Diagnosis not present

## 2015-11-22 DIAGNOSIS — M25512 Pain in left shoulder: Secondary | ICD-10-CM | POA: Diagnosis not present

## 2015-11-29 ENCOUNTER — Encounter (HOSPITAL_COMMUNITY): Payer: Self-pay | Admitting: Occupational Therapy

## 2015-11-29 ENCOUNTER — Ambulatory Visit (HOSPITAL_COMMUNITY): Payer: Medicare Other | Admitting: Occupational Therapy

## 2015-11-29 DIAGNOSIS — M25512 Pain in left shoulder: Secondary | ICD-10-CM | POA: Diagnosis not present

## 2015-11-29 DIAGNOSIS — M6281 Muscle weakness (generalized): Secondary | ICD-10-CM

## 2015-11-29 DIAGNOSIS — M629 Disorder of muscle, unspecified: Secondary | ICD-10-CM

## 2015-11-29 DIAGNOSIS — M6289 Other specified disorders of muscle: Secondary | ICD-10-CM

## 2015-11-29 NOTE — Patient Instructions (Signed)

## 2015-11-29 NOTE — Therapy (Signed)
[place], [place], [address] Phone: [phone]   Fax:  [phone]  Occupational Therapy Treatment  Patient Details  Name: Tracy Pratt MRN: [id number] Date of Birth: 1931-08-29 Referring Provider: Dr. Sanjuana Kava  Encounter Date: 11/29/2015      OT End of Session - 11/29/15 1159    Visit Number 4   Number of Visits 5   Date for OT Re-Evaluation 01/07/16  Mini reassessment 12/09/2015   Authorization Type Medicare/Medicare A & B   Authorization Time Period Before 10th visit   Authorization - Visit Number 4   Authorization - Number of Visits 10   OT Start Time [id number]   OT Stop Time 1018   OT Time Calculation (min) 43 min   Activity Tolerance Patient tolerated treatment well   Behavior During Therapy [facility] for tasks assessed/performed      Past Medical History  Diagnosis Date  . BPH (benign prostatic hyperplasia)   . Malignant hyperthermia   . Adrenal adenoma     left  . Allergy   . Cataract 2007&2009    both   . Arthritis     back,hip  . Hypertension   . Spondylolisthesis     l5-s1  . Prostate cancer ([place]) 08/08/2007    seed implant and external rad tx    Past Surgical History  Procedure Laterality Date  . Transurethral resection of prostate  1997  . Cataract extraction  2007  . Corneal transplant  2007  . Knee surgery  1996    left knee arthoscopic  . Back surgery  1979  . Torn meniscus left knee  1996    There were no vitals filed for this visit.  Visit Diagnosis:  Tight fascia  Muscle weakness of left upper extremity  Muscle weakness (generalized)      Subjective Assessment - 11/29/15 0935    Subjective  S: It seems like to me that the tingling is a lot better    Currently in Pain? No/denies            [facility] OT Assessment - 11/29/15 0935    Assessment   Diagnosis left shoulder pain   Referring Provider Dr. Sanjuana Kava   Precautions   Precautions None                   OT Treatments/Exercises (OP) - 11/29/15 0938    Exercises   Exercises Shoulder;Neck   Neck Exercises: Standing   Other Standing Exercises Isometrics against wall; flexors, lateral flexors (bilateral), extensors; 3x30"   Shoulder Exercises: Supine   Protraction PROM;5 reps   Horizontal ABduction PROM;5 reps   External Rotation PROM;5 reps   Internal Rotation PROM;5 reps   Flexion PROM;5 reps   ABduction PROM;5 reps   Shoulder Exercises: Sidelying   External Rotation Strengthening;15 reps   External Rotation Weight (lbs) 3   Internal Rotation Strengthening;15 reps   Internal Rotation Weight (lbs) 3   Flexion Strengthening;15 reps   Flexion Weight (lbs) 3   ABduction Strengthening;15 reps   ABduction Weight (lbs) 3   Shoulder Exercises: Standing   Extension Theraband;10 reps   Theraband Level (Shoulder Extension) Level 2 (Red)   Row Theraband;10 reps   Theraband Level (Shoulder Row) Level 2 (Red)   Retraction Theraband;10 reps   Theraband Level (Shoulder Retraction) Level 2 (Red)   Manual Therapy   Manual Therapy Myofascial release   Manual therapy comments Manual therapy completed prior  to exercises.   Myofascial Release Myofascial release and manual stretching to Left upper arm, trapezius, and scapularis region.                 OT Education - 11/29/15 1011    Education provided Yes   Education Details scapular theraband   Person(s) Educated Patient   Methods Explanation;Demonstration;Handout   Comprehension Verbalized understanding;Returned demonstration          OT Short Term Goals - 11/15/15 1227    OT SHORT TERM GOAL #1   Title Pt will return to highest level of functioning and independence in daily and volunteer tasks.    Time 4   Period Weeks   Status On-going   OT SHORT TERM GOAL #2   Title Pt will decrease fascial restrictions to trace amounts.    Time 4   Period Weeks   Status On-going   OT SHORT TERM GOAL #3   Title  Pt will report no pain in the LUE during daily and volunteer tasks.    Time 4   Period Weeks   Status On-going   OT SHORT TERM GOAL #4   Title Pt will increase strength to 5/5 to increase ability to lift and carry heavy objects when working at [employer].    Time 4   Period Weeks   Status On-going   OT SHORT TERM GOAL #5   Title Pt will be educated on and independent  in HEP.    Time 4   Period Weeks   Status On-going                  Plan - 11/29/15 1159    Clinical Impression Statement A: Added scapular theraband, verbal cuing for form & technique. Continued isometric exercises this session. Did not add scapular raises or work simulation task due to time constraints.    Plan P: Reassess. Add scapular raises in prone, update HEP. Determine if additional therapy is warranted or if pt ready to d/c with HEP.         Problem List Patient Active Problem List   Diagnosis Date Noted  . Pain in joint, shoulder region 03/23/2014  . Muscle weakness (generalized) 03/23/2014  . Decreased range of motion of left shoulder 03/23/2014  . Prostate cancer ([place]) 10/31/2011  . Spondylolisthesis   . BPH (benign prostatic hyperplasia)   . Malignant hyperthermia   . Adrenal adenoma     Tracy Pratt, OTR/L  [phone]  11/29/2015, 12:01 PM  [place], [place], [address] Phone: [phone]   Fax:  [phone]  Name: Tracy Pratt MRN: [id number] Date of Birth: Feb 15, 1931

## 2015-12-06 ENCOUNTER — Ambulatory Visit (HOSPITAL_COMMUNITY): Payer: Medicare Other | Attending: Orthopaedic Surgery | Admitting: Occupational Therapy

## 2015-12-06 ENCOUNTER — Encounter (HOSPITAL_COMMUNITY): Payer: Self-pay | Admitting: Occupational Therapy

## 2015-12-06 DIAGNOSIS — M629 Disorder of muscle, unspecified: Secondary | ICD-10-CM | POA: Insufficient documentation

## 2015-12-06 DIAGNOSIS — M6281 Muscle weakness (generalized): Secondary | ICD-10-CM

## 2015-12-06 DIAGNOSIS — M25512 Pain in left shoulder: Secondary | ICD-10-CM | POA: Insufficient documentation

## 2015-12-06 DIAGNOSIS — M6289 Other specified disorders of muscle: Secondary | ICD-10-CM

## 2015-12-06 NOTE — Patient Instructions (Addendum)
Isometric Flexion Using light pressure with your fingers against your forehead, resist with your neck muscles to keep your head in the upright position. Hold __30______ seconds. Repeat ___3____ times. Perform ___1-2______ times per day.  Isometric Extension Using light pressure with your fingers against the back of your head, resist with your neck muscles to keep your head in the upright position. Hold ___30_____ seconds. Repeat __3_____ times. Perform ______1-2___ times per day.  Isometric Sidebending Using light pressure with your fingers against the side of your head (above the ear), resist with your neck muscles to keep your head in the upright position. Hold __30______ seconds. Repeat ___3____ times. Perform __1-2_______ times per day.

## 2015-12-06 NOTE — Therapy (Signed)
[place], [place], [address] Phone: [phone]   Fax:  [phone]  Occupational Therapy Reassessment, Treatment, and Discharge Summary  Patient Details  Name: Tracy Pratt MRN: [id number] Date of Birth: Jan 30, 1931 Referring Provider: Dr. Sanjuana Kava  Encounter Date: 12/06/2015      OT End of Session - 12/06/15 1209    Visit Number 5   Number of Visits 5   Date for OT Re-Evaluation 01/07/16  Mini reassessment 12/09/2015   Authorization Type Medicare/Medicare A & B   Authorization Time Period Before 10th visit   Authorization - Visit Number 5   Authorization - Number of Visits 10   OT Start Time 0930   OT Stop Time 1015   OT Time Calculation (min) 45 min   Activity Tolerance Patient tolerated treatment well   Behavior During Therapy [facility] for tasks assessed/performed      Past Medical History  Diagnosis Date  . BPH (benign prostatic hyperplasia)   . Malignant hyperthermia   . Adrenal adenoma     left  . Allergy   . Cataract 2007&2009    both   . Arthritis     back,hip  . Hypertension   . Spondylolisthesis     l5-s1  . Prostate cancer ([place]) 08/08/2007    seed implant and external rad tx    Past Surgical History  Procedure Laterality Date  . Transurethral resection of prostate  1997  . Cataract extraction  2007  . Corneal transplant  2007  . Knee surgery  1996    left knee arthoscopic  . Back surgery  1979  . Torn meniscus left knee  1996    There were no vitals filed for this visit.  Visit Diagnosis:  Tight fascia  Muscle weakness of left upper extremity  Pain in left shoulder      Subjective Assessment - 12/06/15 0933    Subjective  S: I've been down in my back for a week now.    Special Tests FOTO Score: 73/100 (27% impairment)   Currently in Pain? Yes   Pain Score 3    Pain Location Back   Pain Orientation Lower   Pain Descriptors / Indicators Aching   Pain Type Acute  pain   Pain Radiating Towards not radiating   Pain Onset In the past 7 days   Pain Frequency Constant   Aggravating Factors  movement, lifting   Pain Relieving Factors rest, pain medication   Effect of Pain on Daily Activities limiting ability to complete ADL tasks.            [facility] Assessment - 12/06/15 0932    Assessment   Diagnosis left shoulder pain   Precautions   Precautions None   Palpation   Palpation comment Min fascial restrictions in left upper arm, trapezius, and scapularis regions.    AROM   Overall AROM Comments Assessed in sitting, ER/IR adducted   AROM Assessment Site Shoulder   Right/Left Shoulder Left   Left Shoulder Flexion 156 Degrees  150 previous   Left Shoulder ABduction 170 Degrees  160 previous   Left Shoulder Internal Rotation 90 Degrees  same as previous   Left Shoulder External Rotation 70 Degrees  60 previous   PROM   Overall PROM Comments Assessed supine, ER/IR adducted    PROM Assessment Site Shoulder   Right/Left Shoulder Left   Left Shoulder Flexion 170 Degrees  160 previous   Left  Shoulder ABduction 180 Degrees  same as previous   Left Shoulder Internal Rotation 90 Degrees  same as previous   Left Shoulder External Rotation 90 Degrees  same as previous   Strength   Overall Strength Comments Assessed seated, ER/IR adducted   Strength Assessment Site Shoulder   Right/Left Shoulder Left   Left Shoulder Flexion 5/5  4+/5 previous   Left Shoulder ABduction 5/5  4+/5 previous   Left Shoulder Internal Rotation 4/5  same as previous   Left Shoulder External Rotation 4/5  4-/5 previous                  OT Treatments/Exercises (OP) - 12/06/15 0934    Exercises   Exercises Shoulder;Neck   Neck Exercises: Standing   Other Standing Exercises Isometrics against wall; flexors, lateral flexors (bilateral), extensors; 3x30"   Shoulder Exercises: Supine   Protraction PROM;5 reps   Horizontal ABduction PROM;5 reps    External Rotation PROM;5 reps   Internal Rotation PROM;5 reps   Flexion PROM;5 reps   ABduction PROM;5 reps   Shoulder Exercises: Standing   Extension Theraband;10 reps   Theraband Level (Shoulder Extension) Level 2 (Red)   Row Theraband;10 reps   Theraband Level (Shoulder Row) Level 2 (Red)   Retraction Theraband;10 reps   Theraband Level (Shoulder Retraction) Level 2 (Red)   Manual Therapy   Manual Therapy Myofascial release   Manual therapy comments Manual therapy completed prior to exercises.   Myofascial Release Myofascial release and manual stretching to Left upper arm, trapezius, and scapularis region.                OT Education - 12/06/15 1209    Education provided Yes   Education Details cervical isometrics   Person(s) Educated Patient   Methods Explanation;Demonstration;Handout   Comprehension Verbalized understanding;Returned demonstration          OT Short Term Goals - 12/06/15 0955    OT SHORT TERM GOAL #1   Title Pt will return to highest level of functioning and independence in daily and volunteer tasks.    Time 4   Period Weeks   Status Achieved   OT SHORT TERM GOAL #2   Title Pt will decrease fascial restrictions to trace amounts.    Time 4   Period Weeks   Status Not Met   OT SHORT TERM GOAL #3   Title Pt will report no pain in the LUE during daily and volunteer tasks.    Time 4   Period Weeks   Status Achieved   OT SHORT TERM GOAL #4   Title Pt will increase strength to 5/5 to increase ability to lift and carry heavy objects when working at [employer].    Time 4   Period Weeks   Status Partially Met   OT SHORT TERM GOAL #5   Title Pt will be educated on and independent  in HEP.    Time 4   Period Weeks   Status Achieved                  Plan - 12/06/15 1211    Clinical Impression Statement A: Reassessment completed this session, pt has met 3/5 STGs and partially met 1/5 STGs. Pt has made good progress during therapy  sessions, now reports no pain in the LUE and tingling has decreased. Pt does still experience tingling at times, however it is not constant. Pt is agreeable to discharge with HEP.  Plan P: Discharge pt.           G-Codes - 2015/12/08 1213    Functional Assessment Tool Used FOTO Score: 73/100 (27% impairment)   Functional Limitation Carrying, moving and handling objects   Carrying, Moving and Handling Objects Goal Status (X0940) At least 1 percent but less than 20 percent impaired, limited or restricted   Carrying, Moving and Handling Objects Discharge Status [phone]) At least 20 percent but less than 40 percent impaired, limited or restricted      Problem List Patient Active Problem List   Diagnosis Date Noted  . Pain in joint, shoulder region 03/23/2014  . Muscle weakness (generalized) 03/23/2014  . Decreased range of motion of left shoulder 03/23/2014  . Prostate cancer ([place]) 10/31/2011  . Spondylolisthesis   . BPH (benign prostatic hyperplasia)   . Malignant hyperthermia   . Adrenal adenoma    Guadelupe Sabin, OTR/L  [phone]  2015/12/08, 12:14 PM  [place], [place], [address] Phone: [phone]   Fax:  [phone]  Name: Tracy Pratt MRN: [id number] Date of Birth: 1931-10-03    OCCUPATIONAL THERAPY DISCHARGE SUMMARY  Visits from Start of Care: 5  Current functional level related to goals / functional outcomes: See above. Pt reports he is now completing all daily and volunteer activities at his prior level of functioning. Pt now experiences no pain and minimal tingling sensation in his LUE.    Remaining deficits: Pt does continue to have intermittent tingling in the LUE.      Education / Equipment: Reviewed HEP, provided cervical isometrics this session Plan: Patient agrees to discharge.  Patient goals were partially met. Patient is being discharged due to meeting the stated rehab goals.   ?????

## 2015-12-13 ENCOUNTER — Ambulatory Visit (INDEPENDENT_AMBULATORY_CARE_PROVIDER_SITE_OTHER): Payer: Medicare Other

## 2015-12-13 ENCOUNTER — Ambulatory Visit: Payer: Medicare Other

## 2015-12-13 ENCOUNTER — Ambulatory Visit (INDEPENDENT_AMBULATORY_CARE_PROVIDER_SITE_OTHER): Payer: Medicare Other | Admitting: Orthopaedic Surgery

## 2015-12-13 ENCOUNTER — Encounter: Payer: Self-pay | Admitting: Orthopaedic Surgery

## 2015-12-13 ENCOUNTER — Other Ambulatory Visit: Payer: Medicare Other

## 2015-12-13 VITALS — BP 157/82 | HR 67 | Ht 69.5 in | Wt 215.2 lb

## 2015-12-13 DIAGNOSIS — M545 Low back pain, unspecified: Secondary | ICD-10-CM

## 2015-12-13 DIAGNOSIS — M4317 Spondylolisthesis, lumbosacral region: Secondary | ICD-10-CM | POA: Diagnosis not present

## 2015-12-13 MED ORDER — PREDNISONE 5 MG (21) PO TBPK: 5.0000 mg | ORAL_TABLET | Freq: Every day | ORAL | Status: DC

## 2015-12-13 NOTE — Progress Notes (Signed)
Patient [id number] C Gari Tubridy, male DOB:[birth year], 82 y.o. [id number]  Chief Complaint  Patient presents with  . Shoulder Pain    Left shoulder pain "better" but now down with back pain  . Back Pain    HPI  Myaire Wing is a 82 y.o. male who had been followed for left shoulder pain but that is much improved.  He has had lower back pain acutely the last two weeks not getting any better.  He has no trauma, no paresthesias. He cannot take NSAIDs.  He has familiar history of malignant hyperthermia with anesthesia agents.  He has tried rest, ice and heat with no help.      Shoulder Pain  This is a chronic problem. The current episode started more than 1 month ago. There has been no history of extremity trauma. The problem occurs rarely. The problem has been resolved.  Back Pain The current episode started 1 to 4 weeks ago. The problem occurs daily. The problem has been gradually worsening since onset. The pain is present in the lumbar spine. The quality of the pain is described as aching and stabbing. The pain does not radiate. The pain is at a severity of 4/10. The pain is moderate. The pain is worse during the day. The symptoms are aggravated by bending, coughing, standing and twisting. He has tried analgesics, bed rest, heat and ice for the symptoms. The treatment provided no relief.    Body mass index is 31.33 kg/(m^2).  Review of Systems  Patient does not have Diabetes Mellitus. Patient has hypertension. Patient does not have COPD or shortness of breath. Patient does not have BMI > 35. Patient does not have current smoking history.  Review of Systems  Eyes:       Post cataract surgery, doing well   Cardiovascular:       Malignant hyperthermia with anesthesia  Genitourinary:       Post prostate cancer with treatment, doing well.  Musculoskeletal: Positive for back pain and arthralgias (prior of knees and shoulders, resolved for now, recurrent).    Past Medical History   Diagnosis Date  . BPH (benign prostatic hyperplasia)   . Malignant hyperthermia   . Adrenal adenoma     left  . Allergy   . Cataract 2007&2009    both   . Arthritis     back,hip  . Hypertension   . Spondylolisthesis     l5-s1  . Prostate cancer ([place]) 08/08/2007    seed implant and external rad tx    Past Surgical History  Procedure Laterality Date  . Transurethral resection of prostate  1997  . Cataract extraction  2007  . Corneal transplant  2007  . Knee surgery  1996    left knee arthoscopic  . Back surgery  1979  . Torn meniscus left knee  1996    Family History  Problem Relation Age of Onset  . Prostate cancer Brother   . Lung disease Brother     lobectomy    Social History Social History  Substance Use Topics  . Smoking status: Former Smoker -- 1.00 packs/day for 15 years    Types: Cigarettes  . Smokeless tobacco: None     Comment: quit 28 years ago  . Alcohol Use: No    Allergies  Allergen Reactions  . Nsaids Anaphylaxis  . Desflurane     Malignant hyperthermia  . Enflurane     Malignant hyperthermia  . Halothane  Malignant hyperthermia  . Isoflurane     Malignant hyperthermia  . Sevoflurane     Malignant hyperthermia  . Succinylcholine     Malignant hyperthermia    Current Outpatient Prescriptions  Medication Sig Dispense Refill  . furosemide (LASIX) 20 MG tablet Take 20 mg by mouth daily as needed for fluid.    Marland Kitchen HYDROcodone-acetaminophen (NORCO) 7.5-325 MG tablet Take 1 tablet by mouth every 6 (six) hours as needed for moderate pain.    Marland Kitchen lisinopril (PRINIVIL,ZESTRIL) 10 MG tablet Take 10 mg by mouth daily with breakfast.    . loteprednol (LOTEMAX) 0.5 % ophthalmic suspension Place 1 drop into both eyes at bedtime.     . predniSONE (STERAPRED UNI-PAK 21 TAB) 5 MG (21) TBPK tablet Take 1 tablet (5 mg total) by mouth daily. Take as directed. 1 tablet 0   No current facility-administered medications for this visit.     Physical  Exam  Blood pressure 157/82, pulse 67, height 5' 9.5" (1.765 m), weight 215 lb 3.2 oz (97.614 kg).  Constitutional: overall normal hygiene, normal nutrition, well developed, normal grooming, normal body habitus. Assistive device:none  Musculoskeletal: gait and station Limp none, muscle tone and strength are normal, no tremors or atrophy is present.  .  Neurological: coordination overall normal.  Deep tendon reflex/nerve stretch intact.  Sensation normal.  Cranial nerves II-XII intact.   Skin:normal overall no scars, lesions, ulcers or rash es. No psoriasis.  Psychiatric: Alert and oriented x 3.  Recent memory intact, remote memory unclear.  Normal mood and affect. Well groomed.  Good eye contact.  Cardiovascular: overall no swelling, no varicosities, no edema bilaterally, normal temperatures of the legs and arms, no clubbing, cyanosis and good capillary refill.  Lymphatic: palpation is normal.   Extremities:His left shoulder and right shoulders have full ROM.  His back is very tender and he has pain with motion.  He has no spasm.  NV is intact, reflexes are normal. Inspection back is tedner Strength and tone normal Range of motion back motion is limited to extension 5, flexion 25 with pain. Lateral bend normal bilaterally, no spasm, SLR normal  Additional services performed: x-rays of the lumbar spine.  PLAN Call if any problems.  Precautions discussed.  Continue current medications. I will begin prednisone 5 dose pack.   Return to clinic one week

## 2015-12-13 NOTE — Patient Instructions (Signed)
You have one prescription sent in to [facility]

## 2015-12-19 ENCOUNTER — Encounter (HOSPITAL_COMMUNITY): Payer: Self-pay

## 2015-12-20 ENCOUNTER — Encounter: Payer: Self-pay | Admitting: Orthopaedic Surgery

## 2015-12-20 ENCOUNTER — Ambulatory Visit (INDEPENDENT_AMBULATORY_CARE_PROVIDER_SITE_OTHER): Payer: Medicare Other | Admitting: Orthopaedic Surgery

## 2015-12-20 VITALS — BP 145/101 | HR 65 | Temp 98.1°F | Ht 70.0 in | Wt 215.2 lb

## 2015-12-20 DIAGNOSIS — M545 Low back pain, unspecified: Secondary | ICD-10-CM

## 2015-12-20 NOTE — Progress Notes (Signed)
Patient [id number] [name], male DOB:1931-08-01, 82 y.o. [id number]  Chief Complaint  Patient presents with  . Follow-up    back pain    HPI  Tracy Pratt is a 82 y.o. male is much better with his lower back pain.  He has no paresthesias.  He has watched what he does and has avoided lifting.  He has just slight tenderness of the right lower back.  He has no bowel or bladder problems.  HPI  Body mass index is 30.88 kg/(m^2).   Review of Systems  Constitutional:       Patient does not have Diabetes Mellitus. Patient has hypertension. Patient does not have COPD or shortness of breath. Patient does not have BMI > 35. Patient does not have current smoking history.  Eyes:       Post cataract surgery, doing well   Cardiovascular:       Malignant hyperthermia with anesthesia  Genitourinary:       Post prostate cancer with treatment, doing well.  Musculoskeletal: Positive for back pain and arthralgias (prior of knees and shoulders, resolved for now, recurrent).    Past Medical History  Diagnosis Date  . BPH (benign prostatic hyperplasia)   . Malignant hyperthermia   . Adrenal adenoma     left  . Allergy   . Cataract 2007&2009    both   . Arthritis     back,hip  . Hypertension   . Spondylolisthesis     l5-s1  . Prostate cancer ([place]) 08/08/2007    seed implant and external rad tx    Past Surgical History  Procedure Laterality Date  . Transurethral resection of prostate  1997  . Cataract extraction  2007  . Corneal transplant  2007  . Knee surgery  1996    left knee arthoscopic  . Back surgery  1979  . Torn meniscus left knee  1996    Family History  Problem Relation Age of Onset  . Prostate cancer Brother   . Lung disease Brother     lobectomy    Social History Social History  Substance Use Topics  . Smoking status: Former Smoker -- 1.00 packs/day for 15 years    Types: Cigarettes  . Smokeless tobacco: None     Comment: quit 28 years ago  .  Alcohol Use: No    Allergies  Allergen Reactions  . Nsaids Anaphylaxis  . Desflurane     Malignant hyperthermia  . Enflurane     Malignant hyperthermia  . Halothane     Malignant hyperthermia  . Isoflurane     Malignant hyperthermia  . Sevoflurane     Malignant hyperthermia  . Succinylcholine     Malignant hyperthermia    Current Outpatient Prescriptions  Medication Sig Dispense Refill  . furosemide (LASIX) 20 MG tablet Take 20 mg by mouth daily as needed for fluid.    Marland Kitchen HYDROcodone-acetaminophen (NORCO) 7.5-325 MG tablet Take 1 tablet by mouth every 6 (six) hours as needed for moderate pain.    Marland Kitchen lisinopril (PRINIVIL,ZESTRIL) 10 MG tablet Take 10 mg by mouth daily with breakfast.    . loteprednol (LOTEMAX) 0.5 % ophthalmic suspension Place 1 drop into both eyes at bedtime.     . predniSONE (STERAPRED UNI-PAK 21 TAB) 5 MG (21) TBPK tablet Take 1 tablet (5 mg total) by mouth daily. Take as directed. 1 tablet 0   No current facility-administered medications for this visit.     Physical Exam  Blood pressure 145/101, pulse 65, temperature 98.1 F (36.7 C), height 5\' 10"  (1.778 m), weight 215 lb 3.2 oz (97.614 kg).  Constitutional: overall normal hygiene, normal nutrition, well developed, normal grooming, normal body habitus. Assistive device:none  Musculoskeletal: gait and station Limp none, muscle tone and strength are normal, no tremors or atrophy is present.  .  Neurological: coordination overall normal.  Deep tendon reflex/nerve stretch intact.  Sensation normal.  Cranial nerves II-XII intact.   Skin:   normal overall no scars, lesions, ulcers or rashes. No psoriasis.  Psychiatric: Alert and oriented x 3.  Recent memory intact, remote memory unclear.  Normal mood and affect. Well groomed.  Good eye contact.  Cardiovascular: overall no swelling, no varicosities, no edema bilaterally, normal temperatures of the legs and arms, no clubbing, cyanosis and good capillary  refill.  Lymphatic: palpation is normal.  Spine/Pelvis examination:  Inspection:  Overall, sacoiliac joint benign and hips nontender; without crepitus or defects.   Thoracic spine inspection: Alignment normal without kyphosis present   Lumbar spine inspection:  Alignment  with normal lumbar lordosis, without scoliosis apparent.   Thoracic spine palpation:  without tenderness of spinal processes   Lumbar spine palpation: without tenderness of lumbar area; without tightness of lumbar muscles    Range of Motion:   Lumbar flexion, forward flexion is 35  without pain or tenderness    Lumbar extension is 10  without pain or tenderness   Left lateral bend is Normal  without pain or tenderness   Right lateral bend is Normal without pain or tenderness   Straight leg raising is Normal   Strength & tone: Normal   Stability overall normal stability    The patient has been educated about the nature of the problem(s) and counseled on treatment options.  The patient appeared to understand what I have discussed and is in agreement with it.  PLAN Call if any problems.  Precautions discussed.  Continue current medications.   Return to clinic one month

## 2015-12-20 NOTE — Patient Instructions (Signed)
Continue exercises and medicine Avoid heavy lifting Wear weight belt when doing any bending Call if any problem

## 2016-01-17 ENCOUNTER — Ambulatory Visit: Payer: Medicare Other | Admitting: Orthopaedic Surgery

## 2016-01-31 ENCOUNTER — Ambulatory Visit: Payer: Medicare Other

## 2016-02-23 DIAGNOSIS — L821 Other seborrheic keratosis: Secondary | ICD-10-CM | POA: Diagnosis not present

## 2016-02-23 DIAGNOSIS — X32XXXD Exposure to sunlight, subsequent encounter: Secondary | ICD-10-CM | POA: Diagnosis not present

## 2016-02-23 DIAGNOSIS — D225 Melanocytic nevi of trunk: Secondary | ICD-10-CM | POA: Diagnosis not present

## 2016-02-23 DIAGNOSIS — L820 Inflamed seborrheic keratosis: Secondary | ICD-10-CM | POA: Diagnosis not present

## 2016-02-23 DIAGNOSIS — L570 Actinic keratosis: Secondary | ICD-10-CM | POA: Diagnosis not present

## 2016-04-11 ENCOUNTER — Ambulatory Visit (INDEPENDENT_AMBULATORY_CARE_PROVIDER_SITE_OTHER): Payer: Medicare Other | Admitting: Orthopaedic Surgery

## 2016-04-11 ENCOUNTER — Encounter: Payer: Self-pay | Admitting: Orthopaedic Surgery

## 2016-04-11 VITALS — BP 144/74 | HR 69 | Temp 97.7°F | Ht 69.0 in | Wt 218.0 lb

## 2016-04-11 DIAGNOSIS — M25512 Pain in left shoulder: Secondary | ICD-10-CM | POA: Diagnosis not present

## 2016-04-11 NOTE — Progress Notes (Signed)
Patient [id number] [name], male DOB:1930-11-18, 82 y.o. [id number]  Chief Complaint  Patient presents with  . Follow-up    left shoulder pain    HPI  Tracy Pratt is a 82 y.o. male who has left shoulder pain.  It is actually over the left upper trapezius area.  He went to an [employer] a few weeks ago and had to have CT of his mouth done.  It is done standing with his chin on a rest and he holds a perpendicular bar with each hand.  The CT machine goes around the upper body and takes pictures of the mouth.  When the CT was coming around to get the left side of his face and mouth, the machine hit his left shoulder and shoulder blade area and kept pushing against him.  He yelled out but the technician told him to hold still, it was almost done.  It kept pushing against him until he could not take it any more.  Since then he has had pain in the left upper trapezius and left neck area.  He has no pain in the left shoulder itself and has full motion.  He has no numbness.  He has pain in the left upper trapezius that is deep and will not go away.  He has tried ice, heat, rest, massage and rubs with no help.  He is here today for advise.  HPI  Body mass index is 32.18 kg/(m^2).  ROS  Review of Systems  Constitutional:       Patient does not have Diabetes Mellitus. Patient has hypertension. Patient does not have COPD or shortness of breath. Patient does not have BMI > 35. Patient does not have current smoking history.  HENT: Negative for congestion.   Eyes:       Post cataract surgery, doing well   Respiratory: Negative for cough and shortness of breath.   Cardiovascular: Negative for chest pain and leg swelling.       Malignant hyperthermia with anesthesia  Endocrine: Negative for cold intolerance.  Genitourinary:       Post prostate cancer with treatment, doing well.  Musculoskeletal: Positive for back pain and arthralgias (prior of knees and shoulders, resolved for now,  recurrent).  Allergic/Immunologic: Negative for environmental allergies.    Past Medical History  Diagnosis Date  . BPH (benign prostatic hyperplasia)   . Malignant hyperthermia   . Adrenal adenoma     left  . Allergy   . Cataract 2007&2009    both   . Arthritis     back,hip  . Hypertension   . Spondylolisthesis     l5-s1  . Prostate cancer ([place]) 08/08/2007    seed implant and external rad tx    Past Surgical History  Procedure Laterality Date  . Transurethral resection of prostate  1997  . Cataract extraction  2007  . Corneal transplant  2007  . Knee surgery  1996    left knee arthoscopic  . Back surgery  1979  . Torn meniscus left knee  1996    Family History  Problem Relation Age of Onset  . Prostate cancer Brother   . Lung disease Brother     lobectomy    Social History Social History  Substance Use Topics  . Smoking status: Former Smoker -- 1.00 packs/day for 15 years    Types: Cigarettes  . Smokeless tobacco: None     Comment: quit 28 years ago  . Alcohol Use:  No    Allergies  Allergen Reactions  . Nsaids Anaphylaxis  . Desflurane     Malignant hyperthermia  . Enflurane     Malignant hyperthermia  . Halothane     Malignant hyperthermia  . Isoflurane     Malignant hyperthermia  . Sevoflurane     Malignant hyperthermia  . Succinylcholine     Malignant hyperthermia    Current Outpatient Prescriptions  Medication Sig Dispense Refill  . furosemide (LASIX) 20 MG tablet Take 20 mg by mouth daily as needed for fluid.    Marland Kitchen HYDROcodone-acetaminophen (NORCO) 7.5-325 MG tablet Take 1 tablet by mouth every 6 (six) hours as needed for moderate pain.    Marland Kitchen lisinopril (PRINIVIL,ZESTRIL) 10 MG tablet Take 10 mg by mouth daily with breakfast.    . loteprednol (LOTEMAX) 0.5 % ophthalmic suspension Place 1 drop into both eyes at bedtime.     . predniSONE (STERAPRED UNI-PAK 21 TAB) 5 MG (21) TBPK tablet Take 1 tablet (5 mg total) by mouth daily. Take as  directed. 1 tablet 0   No current facility-administered medications for this visit.     Physical Exam  Blood pressure 144/74, pulse 69, temperature 97.7 F (36.5 C), height 5\' 9"  (1.753 m), weight 218 lb (98.884 kg).  Constitutional: overall normal hygiene, normal nutrition, well developed, normal grooming, normal body habitus. Assistive device:none  Musculoskeletal: gait and station Limp none, muscle tone and strength are normal, no tremors or atrophy is present.  .  Neurological: coordination overall normal.  Deep tendon reflex/nerve stretch intact.  Sensation normal.  Cranial nerves II-XII intact.   Skin:   normal overall no scars, lesions, ulcers or rashes. No psoriasis.  Psychiatric: Alert and oriented x 3.  Recent memory intact, remote memory unclear.  Normal mood and affect. Well groomed.  Good eye contact.  Cardiovascular: overall no swelling, no varicosities, no edema bilaterally, normal temperatures of the legs and arms, no clubbing, cyanosis and good capillary refill.  Lymphatic: palpation is normal.  He has full ROM of the shoulders bilaterally.  He has normal NV status and normal grips bilaterally.  The left upper trapezius is tight compared to the right side.  ROM of the neck is full but he has more tightness on the left.  The patient has been educated about the nature of the problem(s) and counseled on treatment options.  The patient appeared to understand what I have discussed and is in agreement with it.  Encounter Diagnosis  Name Primary?  . Left shoulder pain Yes    PLAN Call if any problems.  Precautions discussed.  Continue current medications.   Return to clinic 2 weeks   Begin OT at the hospital.

## 2016-04-11 NOTE — Patient Instructions (Signed)
Begin therapy at [facility].

## 2016-04-16 ENCOUNTER — Encounter (HOSPITAL_COMMUNITY): Payer: Self-pay

## 2016-04-16 ENCOUNTER — Ambulatory Visit (HOSPITAL_COMMUNITY): Payer: Medicare Other | Attending: Orthopaedic Surgery

## 2016-04-16 DIAGNOSIS — M25512 Pain in left shoulder: Secondary | ICD-10-CM | POA: Diagnosis not present

## 2016-04-16 DIAGNOSIS — M25612 Stiffness of left shoulder, not elsewhere classified: Secondary | ICD-10-CM | POA: Diagnosis not present

## 2016-04-16 DIAGNOSIS — R29898 Other symptoms and signs involving the musculoskeletal system: Secondary | ICD-10-CM | POA: Insufficient documentation

## 2016-04-16 NOTE — Therapy (Signed)
Tracy Pratt, [place], [address] Phone: [phone]   Fax:  [phone]  Occupational Therapy Evaluation  Patient Details  Name: Tracy Pratt MRN: [id number] Date of Birth: 12-03-1930 Referring Provider: Sanjuana Kava, MD  Encounter Date: 04/16/2016      OT End of Session - 04/16/16 1836    Visit Number 1   Number of Visits 9   Date for OT Re-Evaluation 05/16/16   Authorization Type Medicare/Aetna Plan F   Authorization Time Period before 10th visit   Authorization - Visit Number 1   Authorization - Number of Visits 10   OT Start Time [id number]   OT Stop Time 1822   OT Time Calculation (min) 39 min   Activity Tolerance Patient tolerated treatment well   Behavior During Therapy [facility] for tasks assessed/performed      Past Medical History  Diagnosis Date  . BPH (benign prostatic hyperplasia)   . Malignant hyperthermia   . Adrenal adenoma     left  . Allergy   . Cataract 2007&2009    both   . Arthritis     back,hip  . Hypertension   . Spondylolisthesis     l5-s1  . Prostate cancer ([place]) 08/08/2007    seed implant and external rad tx    Past Surgical History  Procedure Laterality Date  . Transurethral resection of prostate  1997  . Cataract extraction  2007  . Corneal transplant  2007  . Knee surgery  1996    left knee arthoscopic  . Back surgery  1979  . Torn meniscus left knee  1996    There were no vitals filed for this visit.      Subjective Assessment - 04/16/16 1749    Subjective  S: I was at the [facility]ist and they were taking a CT of my mouth and the machine came around and pushed on my shoulder.    Pertinent History Patient is an 82 y/o male S/P left shoulder pain after receiving a CT at the Dentist when the machine came around and pushed into his left shoulder hard. Patient states that he did not feel pain until later on. Pt wears his sling from his previous RTC surgery for comfort. Dr.  Luna Pratt has referred patient to occupational theraoy for evaluation and treatment.    Special Tests FOTO score: 73/100    Patient Stated Goals To decrease pain level    Currently in Pain? Yes   Pain Score 3    Pain Location Shoulder   Pain Orientation Left   Pain Descriptors / Indicators Dull   Pain Type Acute pain   Pain Radiating Towards N/A   Pain Onset 1 to 4 weeks ago   Pain Frequency Occasional   Aggravating Factors  No aggravating factors. Pain occurs by itself.   Pain Relieving Factors pain medication, rest, and ice pack   Effect of Pain on Daily Activities Severe effect   Multiple Pain Sites No           [facility] OT Assessment - 04/16/16 1735    Assessment   Diagnosis Left shoulder pain   Referring Provider Tracy Kava, MD   Onset Date 03/08/16   Assessment 04/25/16   Prior Therapy Pt received OT services in 2015 and 2017 for Left shoulder   Precautions   Precautions None   Restrictions   Weight Bearing Restrictions No   Balance Screen   Has the patient fallen in  the past 6 months No   Home  Environment   Family/patient expects to be discharged to: Private residence   Prior Function   Level of Independence Independent   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   Mobility   Mobility Status Independent   Written Expression   Dominant Hand Right   Vision - History   Baseline Vision No visual deficits   Cognition   Overall Cognitive Status Within Functional Limits for tasks assessed   ROM / Strength   AROM / PROM / Strength Strength;PROM;AROM   Palpation   Palpation comment Max fascial restrictions in left anterior deltoid and upper trapezius region.   AROM   Overall AROM Comments Assessed seated. IR/er abducted   AROM Assessment Site Shoulder   Right/Left Shoulder Left   Left Shoulder Flexion 132  Degrees   Left Shoulder ABduction 142 Degrees   Left Shoulder Internal Rotation 60 Degrees   Left Shoulder External Rotation 85 Degrees   PROM   Overall PROM Comments Assessed supine. IR/er abducted.   PROM Assessment Site Shoulder   Right/Left Shoulder Left   Left Shoulder Flexion 150 Degrees   Left Shoulder ABduction 180 Degrees   Left Shoulder Internal Rotation 75 Degrees   Left Shoulder External Rotation 76 Degrees   Strength   Overall Strength Comments Assessed seated. IR/er abducted   Strength Assessment Site Shoulder   Right/Left Shoulder Left   Left Shoulder Flexion 4/5   Left Shoulder ABduction 4/5   Left Shoulder Internal Rotation 5/5   Left Shoulder External Rotation 4/5                         OT Education - 04/16/16 1836    Education provided Yes   Education Details shoulder stretches   Person(s) Educated Patient   Methods Explanation;Tactile cues;Demonstration;Verbal cues;Handout   Comprehension Returned demonstration;Verbalized understanding          OT Short Term Goals - 04/16/16 1844    OT SHORT TERM GOAL #1   Title Pt will return to highest level of functioning and independence in daily and volunteer tasks.    Time 4   Period Weeks   Status New   OT SHORT TERM GOAL #2   Title Patient will decrease fascial restrictions to min amount or less in LUE to increase functional mobility.    Time 4   Period Weeks   Status New   OT SHORT TERM GOAL #3   Title Patient will report a 2/10 pain or less in LUE during daily and volunteer tasks.   Time 4   Period Weeks   Status New   OT SHORT TERM GOAL #4   Title Pt will increase strength to 5/5 to increase ability to lift and carry heavy objects when working at [employer].    Time 4   Period Weeks   Status New   OT SHORT TERM GOAL #5   Title Pt will be educated on and independent  in HEP to increase functional performance during daily tasks.   Time 4   Period Weeks   Status New    Additional Short Term Goals   Additional Short Term Goals Yes   OT SHORT TERM GOAL #6   Title Patient  will increase A/ROM to WNL to increase ability to reach overhead and behind his back with less difficulty.    Time 4   Period Weeks   Status New                  Plan - 04/16/16 1838    Clinical Impression Statement A:Patient is a 82 y/o male S/P left shoulder pain causing increased pain and fascial restrictions and decreased ROM and strength resulting in difficulty completing daily tasks using LUE.   Rehab Potential Excellent   OT Frequency 2x / week   OT Duration 4 weeks   OT Treatment/Interventions Self-care/ADL training;Ultrasound;DME and/or AE instruction;Passive range of motion;Patient/family education;Cryotherapy;Electrical Stimulation;Moist Heat;Therapeutic activities;Therapeutic exercises;Manual Therapy   Plan P: Pt will benefit from skilled OT services to increase functional performance during daily tasks using LUE. Treatment Plan: Myofascial release, manual therapy, passive stretching, A/ROM, general shoulder and scapular strengthening.       Patient will benefit from skilled therapeutic intervention in order to improve the following deficits and impairments:  Decreased strength, Pain, Increased fascial restricitons, Decreased range of motion  Visit Diagnosis: Other symptoms and signs involving the musculoskeletal system - Plan: Ot plan of care cert/re-cert  Pain in left shoulder - Plan: Ot plan of care cert/re-cert  Stiffness of left shoulder, not elsewhere classified - Plan: Ot plan of care cert/re-cert      G-Codes - 0000000 1851    Functional Assessment Tool Used FOTO score: 73/100 (27% impaired)   Functional Limitation Carrying, moving and handling objects   Carrying, Moving and Handling Objects Current Status [id number]) At least 20 percent but less than 40 percent impaired, limited or restricted   Carrying, Moving and Handling Objects Goal Status [id number]) At  least 1 percent but less than 20 percent impaired, limited or restricted      Problem List Patient Active Problem List   Diagnosis Date Noted  . Spondylolisthesis at L5-S1 level 12/13/2015  . Midline low back pain without sciatica 12/13/2015  . Pain in joint, shoulder region 03/23/2014  . Muscle weakness (generalized) 03/23/2014  . Decreased range of motion of left shoulder 03/23/2014  . Prostate cancer ([place]) 10/31/2011  . Spondylolisthesis   . BPH (benign prostatic hyperplasia)   . Malignant hyperthermia   . Adrenal adenoma     [name], OTR/L,CBIS  [phone]  04/16/2016, 6:53 PM  [facility] [address] [place], [place], [address] Phone: [phone]   Fax:  [phone]  Name: Maximilian Hildebrandt MRN: [id number] Date of Birth: 1931/08/31

## 2016-04-16 NOTE — Patient Instructions (Signed)
Doorway Stretch  Place each hand opposite each other on the doorway. (You can change where you feel the stretch by moving arms higher or lower.) Step through with one foot and bend front knee until a stretch is felt and hold. Step through with the opposite foot on the next rep. Hold for __10___ seconds. Repeat _2___times.     Scapular Retraction (Standing)   With arms at sides, pinch shoulder blades together. Repeat __10__ times per set. Do __1__ sets per session. Do ____ sessions per day.  http://orth.exer.us/944   Copyright  VHI. All rights reserved.   Internal Rotation Across Back  Grab the end of a towel with your affected side, palm facing backwards. Grab the towel with your unaffected side and pull your affected hand across your back until you feel a stretch in the front of your shoulder. If you feel pain, pull just to the pain, do not pull through the pain. Hold. Return your affected arm to your side. Try to keep your hand/arm close to your body during the entire movement.       Hold for 10 seconds and complete twice.     Posterior Capsule Stretch   Stand or sit, one arm across body so hand rests over opposite shoulder. Gently push on crossed elbow with other hand until stretch is felt in shoulder of crossed arm. Hold _10__ seconds.  Repeat _2__ times per session. Do ___ sessions per day.   Wall Flexion  Using a towel, slide your arm up the wall until a stretch is felt in your shoulder . Hold for 10 seconds. Repeat 2 times.

## 2016-04-18 ENCOUNTER — Ambulatory Visit (HOSPITAL_COMMUNITY): Payer: Medicare Other

## 2016-04-18 DIAGNOSIS — M25612 Stiffness of left shoulder, not elsewhere classified: Secondary | ICD-10-CM

## 2016-04-18 DIAGNOSIS — M25512 Pain in left shoulder: Secondary | ICD-10-CM | POA: Diagnosis not present

## 2016-04-18 DIAGNOSIS — R29898 Other symptoms and signs involving the musculoskeletal system: Secondary | ICD-10-CM

## 2016-04-18 NOTE — Patient Instructions (Signed)

## 2016-04-18 NOTE — Therapy (Signed)
[place], [place], [address] Phone: [phone]   Fax:  [phone]  Occupational Therapy Treatment  Patient Details  Name: Tracy Pratt MRN: [id number] Date of Birth: [birth year] Referring Provider: Sanjuana Kava, MD  Encounter Date: 04/18/2016      OT End of Session - 04/18/16 1512    Visit Number 2   Number of Visits 9   Date for OT Re-Evaluation 05/16/16   Authorization Type Medicare/Aetna Plan F   Authorization Time Period before 10th visit   Authorization - Visit Number 2   Authorization - Number of Visits 10   OT Start Time 1345   OT Stop Time 1430   OT Time Calculation (min) 45 min   Activity Tolerance Patient tolerated treatment well   Behavior During Therapy [facility] for tasks assessed/performed      Past Medical History  Diagnosis Date  . BPH (benign prostatic hyperplasia)   . Malignant hyperthermia   . Adrenal adenoma     left  . Allergy   . Cataract 2007&2009    both   . Arthritis     back,hip  . Hypertension   . Spondylolisthesis     l5-s1  . Prostate cancer ([place]) 08/08/2007    seed implant and external rad tx    Past Surgical History  Procedure Laterality Date  . Transurethral resection of prostate  1997  . Cataract extraction  2007  . Corneal transplant  2007  . Knee surgery  1996    left knee arthoscopic  . Back surgery  1979  . Torn meniscus left knee  1996    There were no vitals filed for this visit.      Subjective Assessment - 04/18/16 1406    Subjective  S: I did my stretches before I came.   Currently in Pain? No/denies            [facility] OT Assessment - 04/18/16 1407    Assessment   Diagnosis Left shoulder pain   Precautions   Precautions None                  OT Treatments/Exercises (OP) - 04/18/16 1407    Exercises   Exercises Shoulder   Shoulder Exercises: Supine   Protraction PROM;AAROM;10 reps   Protraction Weight (lbs) 1    Horizontal ABduction PROM;Strengthening;10 reps   Horizontal ABduction Weight (lbs) 1   External Rotation PROM;Strengthening;10 reps   External Rotation Weight (lbs) 1   Internal Rotation PROM;Strengthening;10 reps   Internal Rotation Weight (lbs) 1   Flexion PROM;Strengthening;10 reps   Shoulder Flexion Weight (lbs) 1   ABduction PROM;Strengthening;10 reps   Shoulder ABduction Weight (lbs) 1   Shoulder Exercises: Standing   Extension Theraband;15 reps   Theraband Level (Shoulder Extension) Level 3 (Green)   Row [employer] reps   Theraband Level (Shoulder Row) Level 3 (Green)   Retraction Theraband;15 reps   Theraband Level (Shoulder Retraction) Level 3 (Green)   Shoulder Exercises: ROM/Strengthening   X to V Arms 10X with 1#   Manual Therapy   Manual Therapy Myofascial release   Manual therapy comments Manual therapy completed prior to exercises.   Myofascial Release Myofascial release and manual stretching completed to left upper arm to decrease fascial restrictions and increase ROM in a pain free zone.                 OT Education - 04/18/16 1406  Education provided Yes   Education Details Pt was given print out of OT evaluation and goals and POC were reviewed. Pt was given green theraband exercises to increase scapular strength and stability.   Person(s) Educated Patient   Methods Explanation;Handout;Demonstration;Verbal cues   Comprehension Verbalized understanding;Returned demonstration          OT Short Term Goals - 04/18/16 1407    OT SHORT TERM GOAL #1   Title Pt will return to highest level of functioning and independence in daily and volunteer tasks.    Time 4   Period Weeks   Status On-going   OT SHORT TERM GOAL #2   Title Patient will decrease fascial restrictions to min amount or less in LUE to increase functional mobility.    Time 4   Period Weeks   Status On-going   OT SHORT TERM GOAL #3   Title Patient will report a 2/10 pain or less in  LUE during daily and volunteer tasks.   Time 4   Period Weeks   Status On-going   OT SHORT TERM GOAL #4   Title Pt will increase strength to 5/5 to increase ability to lift and carry heavy objects when working at [employer].    Time 4   Period Weeks   Status On-going   OT SHORT TERM GOAL #5   Title Pt will be educated on and independent  in HEP to increase functional performance during daily tasks.   Time 4   Period Weeks   Status On-going   OT SHORT TERM GOAL #6   Title Patient will increase A/ROM to WNL to increase ability to reach overhead and behind his back with less difficulty.    Time 4   Period Weeks   Status On-going                  Plan - 04/18/16 1512    Clinical Impression Statement A: Initiated myofascial release, manual stretching and shoulder and scapular strengthening exercises. Pt's ROM has greatly improved since evaluation. Upgraded HEP to include theraband exercises. VC provided for form and technique.   Plan P: Add proximal shoulder strengthening and UBE bike.      Patient will benefit from skilled therapeutic intervention in order to improve the following deficits and impairments:  Decreased strength, Pain, Increased fascial restricitons, Decreased range of motion  Visit Diagnosis: Other symptoms and signs involving the musculoskeletal system  Stiffness of left shoulder, not elsewhere classified    Problem List Patient Active Problem List   Diagnosis Date Noted  . Spondylolisthesis at L5-S1 level 12/13/2015  . Midline low back pain without sciatica 12/13/2015  . Pain in joint, shoulder region 03/23/2014  . Muscle weakness (generalized) 03/23/2014  . Decreased range of motion of left shoulder 03/23/2014  . Prostate cancer ([place]) 10/31/2011  . Spondylolisthesis   . BPH (benign prostatic hyperplasia)   . Malignant hyperthermia   . Adrenal adenoma     Tracy Pratt, OTR/L,CBIS  [phone]  04/18/2016, 3:13 PM  [facility] [address] [place], [place], [address] Phone: [phone]   Fax:  [phone]  Name: Tracy Pratt MRN: [id number] Date of Birth: 01-29-1931

## 2016-04-19 ENCOUNTER — Ambulatory Visit (HOSPITAL_COMMUNITY): Payer: Medicare Other | Admitting: Occupational Therapy

## 2016-04-23 ENCOUNTER — Encounter (HOSPITAL_COMMUNITY): Payer: Self-pay

## 2016-04-23 ENCOUNTER — Ambulatory Visit (HOSPITAL_COMMUNITY): Payer: Medicare Other

## 2016-04-23 DIAGNOSIS — M25512 Pain in left shoulder: Secondary | ICD-10-CM

## 2016-04-23 DIAGNOSIS — R29898 Other symptoms and signs involving the musculoskeletal system: Secondary | ICD-10-CM

## 2016-04-23 DIAGNOSIS — M25612 Stiffness of left shoulder, not elsewhere classified: Secondary | ICD-10-CM

## 2016-04-23 NOTE — Therapy (Addendum)
[place], [place], [address] Phone: [phone]   Fax:  [phone]  Occupational Therapy Treatment  Patient Details  Name: Tracy Pratt MRN: [id number] Date of Birth: [birth year] Referring Provider: Sanjuana Kava, MD  Encounter Date: 04/23/2016      OT End of Session - 04/23/16 1036    Visit Number 3   Number of Visits 9   Date for OT Re-Evaluation 05/16/16   Authorization Type Medicare/Aetna Plan F   Authorization Time Period before 10th visit   Authorization - Visit Number 3   Authorization - Number of Visits 10   OT Start Time [phone]   OT Stop Time 1034   OT Time Calculation (min) 44 min   Activity Tolerance Patient tolerated treatment well   Behavior During Therapy [facility] for tasks assessed/performed      Past Medical History  Diagnosis Date  . BPH (benign prostatic hyperplasia)   . Malignant hyperthermia   . Adrenal adenoma     left  . Allergy   . Cataract 2007&2009    both   . Arthritis     back,hip  . Hypertension   . Spondylolisthesis     l5-s1  . Prostate cancer ([place]) 08/08/2007    seed implant and external rad tx    Past Surgical History  Procedure Laterality Date  . Transurethral resection of prostate  1997  . Cataract extraction  2007  . Corneal transplant  2007  . Knee surgery  1996    left knee arthoscopic  . Back surgery  1979  . Torn meniscus left knee  1996    There were no vitals filed for this visit.      Subjective Assessment - 04/23/16 0953    Currently in Pain? Yes   Pain Score 4    Pain Location Shoulder   Pain Orientation Left   Pain Descriptors / Indicators Sore   Pain Type Acute pain   Pain Radiating Towards N/A   Pain Onset Yesterday   Pain Frequency Constant   Aggravating Factors  Pt completed theraband exercises and experienced soreness.   Pain Relieving Factors Pain medication   Effect of Pain on Daily Activities None   Multiple Pain Sites No                      OT Treatments/Exercises (OP) - 04/23/16 1002    Exercises   Exercises Shoulder   Shoulder Exercises: Supine   Protraction PROM;5 reps;Strengthening;15 reps   Protraction Weight (lbs) 2   Horizontal ABduction PROM;5 reps;Strengthening;15 reps   Horizontal ABduction Weight (lbs) 2   External Rotation PROM;5 reps;Strengthening;15 reps   External Rotation Weight (lbs) 2   Internal Rotation PROM;5 reps;Strengthening;15 reps   Internal Rotation Weight (lbs) 2   Flexion PROM;5 reps;Strengthening;15 reps   Shoulder Flexion Weight (lbs) 2   ABduction PROM;5 reps;Strengthening;15 reps   Shoulder ABduction Weight (lbs) 2   Shoulder Exercises: Standing   Protraction Strengthening;15 reps   Protraction Weight (lbs) 2   Horizontal ABduction Strengthening;15 reps   Horizontal ABduction Weight (lbs) 2   External Rotation Strengthening;15 reps   External Rotation Weight (lbs) 2   Internal Rotation Strengthening;15 reps   Internal Rotation Weight (lbs) 2   Flexion Strengthening;10 reps   Shoulder Flexion Weight (lbs) 2   ABduction Strengthening;15 reps   Shoulder ABduction Weight (lbs) 2   Shoulder Exercises: ROM/Strengthening   UBE (Upper Arm Bike)  Level 2 2' reverse 2' forward   Proximal Shoulder Strengthening, Supine 15X with 2#   Manual Therapy   Manual Therapy Myofascial release   Manual therapy comments Manual therapy completed prior to exercises.   Myofascial Release Myofascial release and manual stretching completed to left upper arm to decrease fascial restrictions and increase ROM in a pain free zone.                   OT Short Term Goals - 04/18/16 1407    OT SHORT TERM GOAL #1   Title Pt will return to highest level of functioning and independence in daily and volunteer tasks.    Time 4   Period Weeks   Status On-going   OT SHORT TERM GOAL #2   Title Patient will decrease fascial restrictions to min amount or less in LUE to  increase functional mobility.    Time 4   Period Weeks   Status On-going   OT SHORT TERM GOAL #3   Title Patient will report a 2/10 pain or less in LUE during daily and volunteer tasks.   Time 4   Period Weeks   Status On-going   OT SHORT TERM GOAL #4   Title Pt will increase strength to 5/5 to increase ability to lift and carry heavy objects when working at [employer].    Time 4   Period Weeks   Status On-going   OT SHORT TERM GOAL #5   Title Pt will be educated on and independent  in HEP to increase functional performance during daily tasks.   Time 4   Period Weeks   Status On-going   OT SHORT TERM GOAL #6   Title Patient will increase A/ROM to WNL to increase ability to reach overhead and behind his back with less difficulty.    Time 4   Period Weeks   Status On-going                  Plan - 04/23/16 1042    Clinical Impression Statement A: Added proximal shoulder strenghtening and UBE bike, increased strengthening to 2#. patient experienced muscle fatigue during standing shoulder flexion and was unable to complete all 15 repetitions. VC for form and technique.   Plan P: Focus on shoulder and scapular stability. Complete proximal shoulder strenthening standing.      Patient will benefit from skilled therapeutic intervention in order to improve the following deficits and impairments:  Decreased strength, Pain, Increased fascial restricitons, Decreased range of motion  Visit Diagnosis: Other symptoms and signs involving the musculoskeletal system  Stiffness of left shoulder, not elsewhere classified  Pain in left shoulder    Problem List Patient Active Problem List   Diagnosis Date Noted  . Spondylolisthesis at L5-S1 level 12/13/2015  . Midline low back pain without sciatica 12/13/2015  . Pain in joint, shoulder region 03/23/2014  . Muscle weakness (generalized) 03/23/2014  . Decreased range of motion of left shoulder 03/23/2014  . Prostate cancer  ([place]) 10/31/2011  . Spondylolisthesis   . BPH (benign prostatic hyperplasia)   . Malignant hyperthermia   . Adrenal adenoma     Tracy Pratt, OTR/L,CBIS  [phone]  04/23/2016, 10:43 AM  [place], [place], [address] Phone: [phone]   Fax:  [phone]  Name: Tracy Pratt MRN: [id number] Date of Birth: 1930-12-28

## 2016-04-25 ENCOUNTER — Encounter (HOSPITAL_COMMUNITY): Payer: Self-pay | Admitting: Occupational Therapy

## 2016-04-25 ENCOUNTER — Encounter: Payer: Self-pay | Admitting: Orthopaedic Surgery

## 2016-04-25 ENCOUNTER — Ambulatory Visit (HOSPITAL_COMMUNITY): Payer: Medicare Other | Admitting: Occupational Therapy

## 2016-04-25 ENCOUNTER — Ambulatory Visit (INDEPENDENT_AMBULATORY_CARE_PROVIDER_SITE_OTHER): Payer: Medicare Other | Admitting: Orthopaedic Surgery

## 2016-04-25 VITALS — BP 124/74 | HR 61 | Temp 97.9°F | Resp 16 | Ht 69.25 in | Wt 216.0 lb

## 2016-04-25 DIAGNOSIS — M25512 Pain in left shoulder: Secondary | ICD-10-CM

## 2016-04-25 MED ORDER — PREDNISONE 10 MG (21) PO TBPK: ORAL_TABLET | ORAL | Status: DC

## 2016-04-25 MED ORDER — METHOCARBAMOL 500 MG PO TABS: 500.0000 mg | ORAL_TABLET | Freq: Three times a day (TID) | ORAL | Status: DC

## 2016-04-25 NOTE — Therapy (Signed)
[place], [place], [address] Phone: [phone]   Fax:  [phone]  Patient Details  Name: Tracy Pratt MRN: [id number] Date of Birth: [birth year] Referring Provider:  [name], MD  Encounter Date: 04/25/2016  Pt arrived for appointment, however reports he is in severe pain and is going to the doctor immediately. Pt asked to cancel today's appointment and hold therapy until after his doctor's visit to determine if further testing is warranted.    Guadelupe Sabin, OTR/L  [phone] 04/25/2016, 9:06 AM  [facility] [address] [place], [place], [address] Phone: [phone]   Fax:  [phone]

## 2016-04-25 NOTE — Progress Notes (Signed)
Patient [id number] Tracy Pratt, male DOB:06-11-1931, 82 y.o. [id number]  Chief Complaint  Patient presents with  . Follow-up    LEFT SHOULDER  (WORSE)    HPI  Tracy Pratt is a 82 y.o. male who is seen for left upper trapezius pain. He has been to OT.  He says he is worse today.  He has been to OT four times.  He is taking his pain medicine and not getting much relief.  He has no trauma, no paresthesias.  He has no redness.  He is just sore all the time.  HPI  Body mass index is 31.67 kg/(m^2).  ROS  Review of Systems  Constitutional:       Patient does not have Diabetes Mellitus. Patient has hypertension. Patient does not have COPD or shortness of breath. Patient does not have BMI > 35. Patient does not have current smoking history.  HENT: Negative for congestion.   Eyes:       Post cataract surgery, doing well   Respiratory: Negative for cough and shortness of breath.   Cardiovascular: Negative for chest pain and leg swelling.       Malignant hyperthermia with anesthesia  Endocrine: Negative for cold intolerance.  Genitourinary:       Post prostate cancer with treatment, doing well.  Musculoskeletal: Positive for back pain and arthralgias (prior of knees and shoulders, resolved for now, recurrent).  Allergic/Immunologic: Negative for environmental allergies.    Past Medical History  Diagnosis Date  . BPH (benign prostatic hyperplasia)   . Malignant hyperthermia   . Adrenal adenoma     left  . Allergy   . Cataract 2007&2009    both   . Arthritis     back,hip  . Hypertension   . Spondylolisthesis     l5-s1  . Prostate cancer ([place]) 08/08/2007    seed implant and external rad tx    Past Surgical History  Procedure Laterality Date  . Transurethral resection of prostate  1997  . Cataract extraction  2007  . Corneal transplant  2007  . Knee surgery  1996    left knee arthoscopic  . Back surgery  1979  . Torn meniscus left knee  1996    Family History   Problem Relation Age of Onset  . Prostate cancer Brother   . Lung disease Brother     lobectomy    Social History Social History  Substance Use Topics  . Smoking status: Former Smoker -- 1.00 packs/day for 15 years    Types: Cigarettes  . Smokeless tobacco: None     Comment: quit 28 years ago  . Alcohol Use: No    Allergies  Allergen Reactions  . Nsaids Anaphylaxis  . Desflurane     Malignant hyperthermia  . Enflurane     Malignant hyperthermia  . Halothane     Malignant hyperthermia  . Isoflurane     Malignant hyperthermia  . Sevoflurane     Malignant hyperthermia  . Succinylcholine     Malignant hyperthermia    Current Outpatient Prescriptions  Medication Sig Dispense Refill  . furosemide (LASIX) 20 MG tablet Take 20 mg by mouth daily as needed for fluid.    Marland Kitchen HYDROcodone-acetaminophen (NORCO) 7.5-325 MG tablet Take 1 tablet by mouth every 6 (six) hours as needed for moderate pain.    Marland Kitchen lisinopril (PRINIVIL,ZESTRIL) 10 MG tablet Take 10 mg by mouth daily with breakfast.    . loteprednol (LOTEMAX) 0.5 % ophthalmic  suspension Place 1 drop into both eyes at bedtime.     . methocarbamol (ROBAXIN) 500 MG tablet Take 1 tablet (500 mg total) by mouth 3 (three) times daily. 60 tablet 1  . predniSONE (STERAPRED UNI-PAK 21 TAB) 10 MG (21) TBPK tablet Take six pills the first day;5 pills the next day;4 pills the next day;3 pills the next day; 2 pills the next day,one the final day. 21 tablet 1   No current facility-administered medications for this visit.     Physical Exam  Blood pressure 124/74, pulse 61, temperature 97.9 F (36.6 Tracy), resp. rate 16, height 5' 9.25" (1.759 m), weight 216 lb (97.977 kg).  Constitutional: overall normal hygiene, normal nutrition, well developed, normal grooming, normal body habitus. Assistive device:none  Musculoskeletal: gait and station Limp none, muscle tone and strength are normal, no tremors or atrophy is present.  .  Neurological:  coordination overall normal.  Deep tendon reflex/nerve stretch intact.  Sensation normal.  Cranial nerves II-XII intact.   Skin:   normal overall no scars, lesions, ulcers or rashes. No psoriasis.  Psychiatric: Alert and oriented x 3.  Recent memory intact, remote memory unclear.  Normal mood and affect. Well groomed.  Good eye contact.  Cardiovascular: overall no swelling, no varicosities, no edema bilaterally, normal temperatures of the legs and arms, no clubbing, cyanosis and good capillary refill.  Lymphatic: palpation is normal.  Examination of left Upper Extremity is done.  Inspection:   Overall:  Elbow non-tender without crepitus or defects, forearm non-tender without crepitus or defects, wrist non-tender without crepitus or defects, hand non-tender.    Shoulder: without glenohumeral joint tenderness, without effusion.   Upper arm: without swelling and tenderness   Range of motion:   Overall:  Full range of motion of the elbow, full range of motion of wrist and full range of motion in fingers.   Shoulder:  left  full degrees forward flexion; full degrees abduction; full degrees internal rotation, full degrees external rotation, full degrees extension, full degrees adduction.   Stability:   Overall:  Shoulder, elbow and wrist stable   Strength and Tone:   Overall full shoulder muscles strength, full upper arm strength and normal upper arm bulk and tone.  He has tightness of the left upper trapezius and pain with palpation.   The patient has been educated about the nature of the problem(s) and counseled on treatment options.  The patient appeared to understand what I have discussed and is in agreement with it.  Encounter Diagnosis  Name Primary?  . Left shoulder pain Yes    PLAN Call if any problems.  Precautions discussed.  Continue current medications.   Return to clinic 2 weeks   Continue OT.  If not better, call and we need to get MRI of neck (x-rays first).  I have  given Rx for prednisone and Robaxin.  Electronically Signed Sanjuana Kava, MD 6/28/201710:10 AM

## 2016-04-27 ENCOUNTER — Encounter (HOSPITAL_COMMUNITY): Payer: Self-pay

## 2016-04-27 ENCOUNTER — Ambulatory Visit (HOSPITAL_COMMUNITY): Payer: Medicare Other

## 2016-04-27 DIAGNOSIS — R29898 Other symptoms and signs involving the musculoskeletal system: Secondary | ICD-10-CM

## 2016-04-27 DIAGNOSIS — M25612 Stiffness of left shoulder, not elsewhere classified: Secondary | ICD-10-CM | POA: Diagnosis not present

## 2016-04-27 DIAGNOSIS — M25512 Pain in left shoulder: Secondary | ICD-10-CM

## 2016-04-27 NOTE — Therapy (Signed)
Tracy Pratt, [place], [address] Phone: [phone]   Fax:  [phone]  Occupational Therapy Treatment  Patient Details  Name: Tracy Pratt MRN: [id number] Date of Birth: [birth year] Referring Provider: Sanjuana Kava, MD  Encounter Date: 04/27/2016      OT End of Session - 04/27/16 1024    Visit Number 4   Number of Visits 9   Date for OT Re-Evaluation 05/16/16   Authorization Type Medicare/Aetna Plan F   Authorization Time Period before 10th visit   Authorization - Visit Number 4   Authorization - Number of Visits 10   OT Start Time [phone]   OT Stop Time 1030   OT Time Calculation (min) 40 min   Activity Tolerance Patient tolerated treatment well   Behavior During Therapy [facility] for tasks assessed/performed      Past Medical History  Diagnosis Date  . BPH (benign prostatic hyperplasia)   . Malignant hyperthermia   . Adrenal adenoma     left  . Allergy   . Cataract 2007&2009    both   . Arthritis     back,hip  . Hypertension   . Spondylolisthesis     l5-s1  . Prostate cancer ([place]) 08/08/2007    seed implant and external rad tx    Past Surgical History  Procedure Laterality Date  . Transurethral resection of prostate  1997  . Cataract extraction  2007  . Corneal transplant  2007  . Knee surgery  1996    left knee arthoscopic  . Back surgery  1979  . Torn meniscus left knee  1996    There were no vitals filed for this visit.      Subjective Assessment - 04/27/16 1002    Subjective  S: I went to Dr. Luna Glasgow Wednesday because I hurting so bad and he said to continue therapy and if it didn't get better he was going to X-ray or MRI my neck.   Currently in Pain? Yes   Pain Score 5    Pain Location Shoulder   Pain Orientation Left   Pain Descriptors / Indicators Radiating;Nagging   Pain Type Acute pain            [facility] OT Assessment - 04/27/16 1004    Assessment   Diagnosis Left  shoulder pain   Precautions   Precautions None                  OT Treatments/Exercises (OP) - 04/27/16 1004    Exercises   Exercises Shoulder   Shoulder Exercises: Supine   Protraction PROM;5 reps;Strengthening;10 reps   Protraction Weight (lbs) 2   Horizontal ABduction PROM;5 reps;Strengthening;10 reps   Horizontal ABduction Weight (lbs) 2   External Rotation PROM;5 reps;Strengthening;10 reps   External Rotation Weight (lbs) 2   Internal Rotation PROM;5 reps;Strengthening;10 reps   Internal Rotation Weight (lbs) 2   Flexion PROM;5 reps;Strengthening;10 reps   Shoulder Flexion Weight (lbs) 2   ABduction PROM;5 reps;Strengthening;10 reps   Shoulder ABduction Weight (lbs) 2   Shoulder Exercises: ROM/Strengthening   X to V Arms 12X   Proximal Shoulder Strengthening, Supine 10X with 2#   Modalities   Modalities Electrical Stimulation;Moist Heat   Moist Heat Therapy   Number Minutes Moist Heat 10 Minutes   Moist Heat Location Shoulder   Electrical Stimulation   Electrical Stimulation Location Left shoulder   Electrical Stimulation Action interefential   Electrical Stimulation Parameters 15  CV   Electrical Stimulation Goals Pain   Manual Therapy   Manual therapy comments Manual therapy completed prior to exercises.                  OT Short Term Goals - 04/18/16 1407    OT SHORT TERM GOAL #1   Title Pt will return to highest level of functioning and independence in daily and volunteer tasks.    Time 4   Period Weeks   Status On-going   OT SHORT TERM GOAL #2   Title Patient will decrease fascial restrictions to min amount or less in LUE to increase functional mobility.    Time 4   Period Weeks   Status On-going   OT SHORT TERM GOAL #3   Title Patient will report a 2/10 pain or less in LUE during daily and volunteer tasks.   Time 4   Period Weeks   Status On-going   OT SHORT TERM GOAL #4   Title Pt will increase strength to 5/5 to increase ability  to lift and carry heavy objects when working at [employer].    Time 4   Period Weeks   Status On-going   OT SHORT TERM GOAL #5   Title Pt will be educated on and independent  in HEP to increase functional performance during daily tasks.   Time 4   Period Weeks   Status On-going   OT SHORT TERM GOAL #6   Title Patient will increase A/ROM to WNL to increase ability to reach overhead and behind his back with less difficulty.    Time 4   Period Weeks   Status On-going                  Plan - 04/27/16 1024    Clinical Impression Statement A: Session focused on pain management and control. Added ES and moist heat followed by strengthening at a lesser repetition. VC for form and technique.   Plan P: Focus on pain control and shoulder/scapular stability. Complete shoulder proximal shoulder strengthening standing.       Patient will benefit from skilled therapeutic intervention in order to improve the following deficits and impairments:  Decreased strength, Pain, Increased fascial restricitons, Decreased range of motion  Visit Diagnosis: Pain in left shoulder  Other symptoms and signs involving the musculoskeletal system  Stiffness of left shoulder, not elsewhere classified    Problem List Patient Active Problem List   Diagnosis Date Noted  . Spondylolisthesis at L5-S1 level 12/13/2015  . Midline low back pain without sciatica 12/13/2015  . Pain in joint, shoulder region 03/23/2014  . Muscle weakness (generalized) 03/23/2014  . Decreased range of motion of left shoulder 03/23/2014  . Prostate cancer ([place]) 10/31/2011  . Spondylolisthesis   . BPH (benign prostatic hyperplasia)   . Malignant hyperthermia   . Adrenal adenoma    [name], OTR/L,CBIS  [phone]  04/27/2016, 10:32 AM  [facility] [place] [address] [place], [place], [address] Phone: [phone]   Fax:  [phone]  Name: Tracy Pratt MRN:  [id number] Date of Birth: January 20, 1931

## 2016-04-30 ENCOUNTER — Ambulatory Visit (HOSPITAL_COMMUNITY): Payer: Medicare Other | Attending: Orthopaedic Surgery | Admitting: Specialist

## 2016-04-30 DIAGNOSIS — M25612 Stiffness of left shoulder, not elsewhere classified: Secondary | ICD-10-CM

## 2016-04-30 DIAGNOSIS — M25512 Pain in left shoulder: Secondary | ICD-10-CM | POA: Diagnosis not present

## 2016-04-30 DIAGNOSIS — R29898 Other symptoms and signs involving the musculoskeletal system: Secondary | ICD-10-CM | POA: Diagnosis not present

## 2016-04-30 NOTE — Therapy (Signed)
[place], [place], [address] Phone: [phone]   Fax:  [phone]  Occupational Therapy Treatment  Patient Details  Name: Tracy Pratt MRN: [id number] Date of Birth: February 23, 1931 Referring Provider: Sanjuana Kava, MD  Encounter Date: 04/30/2016      OT End of Session - 04/30/16 1019    Visit Number 5   Number of Visits 9   Date for OT Re-Evaluation 05/16/16   Authorization Type Medicare/Aetna Plan F   Authorization Time Period before 10th visit   Authorization - Visit Number 5   Authorization - Number of Visits 10   OT Start Time 0906   OT Stop Time 0950   OT Time Calculation (min) 44 min   Activity Tolerance Patient tolerated treatment well   Behavior During Therapy [facility] for tasks assessed/performed      Past Medical History  Diagnosis Date  . BPH (benign prostatic hyperplasia)   . Malignant hyperthermia   . Adrenal adenoma     left  . Allergy   . Cataract 2007&2009    both   . Arthritis     back,hip  . Hypertension   . Spondylolisthesis     l5-s1  . Prostate cancer ([place]) 08/08/2007    seed implant and external rad tx    Past Surgical History  Procedure Laterality Date  . Transurethral resection of prostate  1997  . Cataract extraction  2007  . Corneal transplant  2007  . Knee surgery  1996    left knee arthoscopic  . Back surgery  1979  . Torn meniscus left knee  1996    There were no vitals filed for this visit.      Subjective Assessment - 04/30/16 0907    Subjective  S:  I did the exercises yesterday and I paid for it - I hardly slept at all.   Currently in Pain? Yes   Pain Score 6    Pain Location Shoulder   Pain Orientation Left   Pain Descriptors / Indicators Radiating;Nagging   Pain Type Acute pain   Pain Onset Yesterday   Pain Frequency Constant            [facility] OT Assessment - 04/30/16 0001    Assessment   Diagnosis Left shoulder pain   Precautions   Precautions None                  OT Treatments/Exercises (OP) - 04/30/16 0001    Exercises   Exercises Shoulder   Shoulder Exercises: Supine   Protraction PROM;5 reps   Horizontal ABduction PROM;5 reps   External Rotation PROM;5 reps   Internal Rotation PROM;5 reps   Flexion PROM;5 reps   ABduction PROM;5 reps   Shoulder Exercises: Seated   Retraction AROM;10 reps   Horizontal ABduction AROM;10 reps   External Rotation AROM;10 reps   Internal Rotation AROM;10 reps   Flexion AROM;10 reps   Abduction AROM;10 reps   Shoulder Exercises: ROM/Strengthening   UBE (Upper Arm Bike) Level 2 3' reverse 3' forward   "W" Arms 10X   X to V Arms 12X   Proximal Shoulder Strengthening, Seated 10 times each with verbal guidance to depress scapula   Manual Therapy   Manual Therapy Myofascial release   Manual therapy comments Manual therapy completed prior to exercises.   Myofascial Release Myofascial release and manual stretching completed to left upper arm to decrease fascial restrictions and increase ROM in  a pain free zone. manual cervical traction and myofascial release to cervical region.                OT Education - 04/30/16 [phone]    Education Details left neck stretches upper trapezius, cervical retraction, levator scapula   Person(s) Educated Patient   Methods Explanation;Demonstration;Handout   Comprehension Verbalized understanding;Returned demonstration          OT Short Term Goals - 04/18/16 1407    OT SHORT TERM GOAL #1   Title Pt will return to highest level of functioning and independence in daily and volunteer tasks.    Time 4   Period Weeks   Status On-going   OT SHORT TERM GOAL #2   Title Patient will decrease fascial restrictions to min amount or less in LUE to increase functional mobility.    Time 4   Period Weeks   Status On-going   OT SHORT TERM GOAL #3   Title Patient will report a 2/10 pain or less in LUE during daily and volunteer tasks.    Time 4   Period Weeks   Status On-going   OT SHORT TERM GOAL #4   Title Pt will increase strength to 5/5 to increase ability to lift and carry heavy objects when working at [employer].    Time 4   Period Weeks   Status On-going   OT SHORT TERM GOAL #5   Title Pt will be educated on and independent  in HEP to increase functional performance during daily tasks.   Time 4   Period Weeks   Status On-going   OT SHORT TERM GOAL #6   Title Patient will increase A/ROM to WNL to increase ability to reach overhead and behind his back with less difficulty.    Time 4   Period Weeks   Status On-going                  Plan - 04/30/16 1019    Clinical Impression Statement A:  Session focused on pain management, added cervical myofascial release, manual cervical traction and cervical and upper trapezius stretches to HEP.    Plan P:  add rythmic stabilization exercises in supine and sidelying A/ROM for improved scapular rhythm and stability.       Patient will benefit from skilled therapeutic intervention in order to improve the following deficits and impairments:  Decreased strength, Pain, Increased fascial restricitons, Decreased range of motion  Visit Diagnosis: Pain in left shoulder  Other symptoms and signs involving the musculoskeletal system  Stiffness of left shoulder, not elsewhere classified    Problem List Patient Active Problem List   Diagnosis Date Noted  . Spondylolisthesis at L5-S1 level 12/13/2015  . Midline low back pain without sciatica 12/13/2015  . Pain in joint, shoulder region 03/23/2014  . Muscle weakness (generalized) 03/23/2014  . Decreased range of motion of left shoulder 03/23/2014  . Prostate cancer ([place]) 10/31/2011  . Spondylolisthesis   . BPH (benign prostatic hyperplasia)   . Malignant hyperthermia   . Adrenal adenoma     Vangie Bicker, [place], OTR/L [phone]  04/30/2016, 10:27 AM  [place], [place], [address] Phone: [phone]   Fax:  [phone]  Name: Noar Alessandro MRN: [id number] Date of Birth: [birth year]

## 2016-04-30 NOTE — Patient Instructions (Signed)
  Flexibility: Upper Trapezius Stretch   Gently grasp left side of head while reaching behind back with left hand. Tilt head away until a gentle stretch is felt. Hold __15__ seconds. Repeat _5___ times per set. Do _1___ sets per session. Do ___2_ sessions per day.  http://orth.exer.us/340   Levator Stretch   Grasp seat or sit on left hand on side to be stretched. Turn head toward right side and look down. Use right hand on head to gently stretch neck in that position. Hold __10__ seconds. Repeat on other side. Repeat _5___ times. Do _2___ sessions per day.  http://[email or web address].exer.us/30   Scapular Retraction (Standing)   With arms at sides, pinch shoulder blades together. Repeat __10__ times per set. Do _1_ sets per session. Do _2___ sessions per day.  http://orth.exer.us/944   Flexibility: Neck Retraction   Pull head straight back, keeping eyes and jaw level. Repeat _5___ times per set. Do __1__ sets per session. Do _2___ sessions per day.  http://orth.exer.W9453499       http://[email or web address].exer.us/30   Copyright  VHI. All rights reserved.  Side-Bending   One hand on opposite side of head, pull head to side as far as is comfortable. Stop if there is pain. Hold ____ seconds. Repeat with other hand to other side. Repeat ____ times. Do ____ sessions per day.   Copyright  VHI. All rights reserved.

## 2016-05-02 ENCOUNTER — Ambulatory Visit (HOSPITAL_COMMUNITY): Payer: Medicare Other

## 2016-05-02 ENCOUNTER — Encounter (HOSPITAL_COMMUNITY): Payer: Self-pay

## 2016-05-02 DIAGNOSIS — R29898 Other symptoms and signs involving the musculoskeletal system: Secondary | ICD-10-CM | POA: Diagnosis not present

## 2016-05-02 DIAGNOSIS — M25612 Stiffness of left shoulder, not elsewhere classified: Secondary | ICD-10-CM

## 2016-05-02 DIAGNOSIS — M25512 Pain in left shoulder: Secondary | ICD-10-CM

## 2016-05-02 NOTE — Therapy (Signed)
[place], [place], [address] Phone: [phone]   Fax:  [phone]  Occupational Therapy Treatment  Patient Details  Name: Tracy Pratt MRN: [id number] Date of Birth: 01-Jul-1931 Referring Provider: Sanjuana Kava, MD  Encounter Date: 05/02/2016      OT End of Session - 05/02/16 0943    Visit Number 6   Number of Visits 9   Date for OT Re-Evaluation 05/16/16   Authorization Type Medicare/Aetna Plan F   Authorization Time Period before 10th visit   Authorization - Visit Number 6   Authorization - Number of Visits 10   OT Start Time 0902   OT Stop Time 0945   OT Time Calculation (min) 43 min   Activity Tolerance Patient tolerated treatment well   Behavior During Therapy [facility] for tasks assessed/performed      Past Medical History  Diagnosis Date  . BPH (benign prostatic hyperplasia)   . Malignant hyperthermia   . Adrenal adenoma     left  . Allergy   . Cataract 2007&2009    both   . Arthritis     back,hip  . Hypertension   . Spondylolisthesis     l5-s1  . Prostate cancer ([place]) 08/08/2007    seed implant and external rad tx    Past Surgical History  Procedure Laterality Date  . Transurethral resection of prostate  1997  . Cataract extraction  2007  . Corneal transplant  2007  . Knee surgery  1996    left knee arthoscopic  . Back surgery  1979  . Torn meniscus left knee  1996    There were no vitals filed for this visit.          [facility] OT Assessment - 05/02/16 0922    Assessment   Diagnosis Left shoulder pain   Precautions   Precautions None                  OT Treatments/Exercises (OP) - 05/02/16 0922    Exercises   Exercises Shoulder   Shoulder Exercises: Supine   Protraction PROM;5 reps;AROM;10 reps   Horizontal ABduction PROM;5 reps;AROM;10 reps   External Rotation PROM;5 reps;AROM;10 reps   Internal Rotation PROM;5 reps;AROM;10 reps   Flexion PROM;5  reps;AROM;10 reps   ABduction PROM;5 reps;AROM;10 reps   Shoulder Exercises: Sidelying   External Rotation AROM;10 reps   Internal Rotation AROM;10 reps   Flexion AROM;10 reps   ABduction AROM;10 reps   Shoulder Exercises: Standing   Extension Theraband;10 reps   Theraband Level (Shoulder Extension) Level 2 (Red)   Row Theraband;10 reps   Theraband Level (Shoulder Row) Level 2 (Red)   Retraction Theraband;10 reps   Theraband Level (Shoulder Retraction) Level 2 (Red)   Shoulder Exercises: ROM/Strengthening   Rhythmic Stabilization, Supine 30 seconds with arm in 90 degrees flexion   Manual Therapy   Manual Therapy Myofascial release   Manual therapy comments Manual therapy completed prior to exercises.   Myofascial Release Myofascial release and manual stretching completed to left upper arm to decrease fascial restrictions and increase ROM in a pain free zone. manual cervical traction and myofascial release to cervical region.                  OT Short Term Goals - 04/18/16 1407    OT SHORT TERM GOAL #1   Title Pt will return to highest level of functioning and independence in daily and volunteer tasks.  Time 4   Period Weeks   Status On-going   OT SHORT TERM GOAL #2   Title Patient will decrease fascial restrictions to min amount or less in LUE to increase functional mobility.    Time 4   Period Weeks   Status On-going   OT SHORT TERM GOAL #3   Title Patient will report a 2/10 pain or less in LUE during daily and volunteer tasks.   Time 4   Period Weeks   Status On-going   OT SHORT TERM GOAL #4   Title Pt will increase strength to 5/5 to increase ability to lift and carry heavy objects when working at [employer].    Time 4   Period Weeks   Status On-going   OT SHORT TERM GOAL #5   Title Pt will be educated on and independent  in HEP to increase functional performance during daily tasks.   Time 4   Period Weeks   Status On-going   OT SHORT TERM GOAL #6    Title Patient will increase A/ROM to WNL to increase ability to reach overhead and behind his back with less difficulty.    Time 4   Period Weeks   Status On-going                  Plan - 05/02/16 [id number]    Clinical Impression Statement A: Session focused on pain management and shoulder/scapular stability. Discussed HEP and to continue completing at home. Added rythmic stabilzation and sidelying exercises. VC for form and technique.   Plan P: Continue with focusing on form and technique to decrease pain level. Increase repeitions.       Patient will benefit from skilled therapeutic intervention in order to improve the following deficits and impairments:  Decreased strength, Pain, Increased fascial restricitons, Decreased range of motion  Visit Diagnosis: Pain in left shoulder  Other symptoms and signs involving the musculoskeletal system  Stiffness of left shoulder, not elsewhere classified    Problem List Patient Active Problem List   Diagnosis Date Noted  . Spondylolisthesis at L5-S1 level 12/13/2015  . Midline low back pain without sciatica 12/13/2015  . Pain in joint, shoulder region 03/23/2014  . Muscle weakness (generalized) 03/23/2014  . Decreased range of motion of left shoulder 03/23/2014  . Prostate cancer ([place]) 10/31/2011  . Spondylolisthesis   . BPH (benign prostatic hyperplasia)   . Malignant hyperthermia   . Adrenal adenoma     [name], OTR/L,CBIS  [phone]  05/02/2016, 9:46 AM  [place] [address] [place], [place], [address] Phone: [phone]   Fax:  [phone]  Name: Tracy Pratt MRN: [id number] Date of Birth: 04-Dec-1930

## 2016-05-07 ENCOUNTER — Ambulatory Visit (HOSPITAL_COMMUNITY): Payer: Medicare Other

## 2016-05-07 ENCOUNTER — Encounter (HOSPITAL_COMMUNITY): Payer: Self-pay

## 2016-05-07 DIAGNOSIS — M25612 Stiffness of left shoulder, not elsewhere classified: Secondary | ICD-10-CM

## 2016-05-07 DIAGNOSIS — M25512 Pain in left shoulder: Secondary | ICD-10-CM | POA: Diagnosis not present

## 2016-05-07 DIAGNOSIS — R29898 Other symptoms and signs involving the musculoskeletal system: Secondary | ICD-10-CM

## 2016-05-07 NOTE — Therapy (Signed)
[place], [place], [address] Phone: [phone]   Fax:  [phone]  Occupational Therapy Treatment  Patient Details  Name: Tracy Pratt MRN: [id number] Date of Birth: 1931/09/08 Referring Provider: Sanjuana Kava, MD  Encounter Date: 05/07/2016      OT End of Session - 05/07/16 1022    Visit Number 7   Number of Visits 9   Date for OT Re-Evaluation 05/16/16   Authorization Type Medicare/Aetna Plan F   Authorization Time Period before 10th visit   Authorization - Visit Number 7   Authorization - Number of Visits 10   OT Start Time 0945   OT Stop Time 1030   OT Time Calculation (min) 45 min   Activity Tolerance Patient tolerated treatment well   Behavior During Therapy [facility] for tasks assessed/performed      Past Medical History  Diagnosis Date  . BPH (benign prostatic hyperplasia)   . Malignant hyperthermia   . Adrenal adenoma     left  . Allergy   . Cataract 2007&2009    both   . Arthritis     back,hip  . Hypertension   . Spondylolisthesis     l5-s1  . Prostate cancer ([place]) 08/08/2007    seed implant and external rad tx    Past Surgical History  Procedure Laterality Date  . Transurethral resection of prostate  1997  . Cataract extraction  2007  . Corneal transplant  2007  . Knee surgery  1996    left knee arthoscopic  . Back surgery  1979  . Torn meniscus left knee  1996    There were no vitals filed for this visit.      Subjective Assessment - 05/07/16 0959    Currently in Pain? Yes   Pain Score 2    Pain Location Shoulder   Pain Orientation Left   Pain Descriptors / Indicators Tender   Pain Type Acute pain   Pain Radiating Towards N/A   Pain Onset In the past 7 days   Pain Frequency Occasional   Aggravating Factors  repetition of doing things.   Pain Relieving Factors Pain medication   Effect of Pain on Daily Activities None   Multiple Pain Sites No             [facility] OT Assessment - 05/07/16 1001    Assessment   Diagnosis Left shoulder pain   Precautions   Precautions None                  OT Treatments/Exercises (OP) - 05/07/16 1001    Exercises   Exercises Shoulder   Shoulder Exercises: Supine   Protraction PROM;5 reps;AROM;15 reps   Horizontal ABduction PROM;5 reps;AROM;15 reps   External Rotation PROM;5 reps;AROM;15 reps   Internal Rotation PROM;5 reps;AROM;15 reps   Flexion PROM;5 reps;AROM;15 reps   ABduction PROM;5 reps;AROM;15 reps   Shoulder Exercises: Standing   Protraction AROM;15 reps   Horizontal ABduction AROM;15 reps   External Rotation AROM;15 reps   Internal Rotation AROM;15 reps   Flexion AROM;15 reps   ABduction AROM;15 reps   Extension Theraband;12 reps   Theraband Level (Shoulder Extension) Level 2 (Red)   Row Theraband;12 reps   Theraband Level (Shoulder Row) Level 2 (Red)   Retraction Theraband;12 reps   Theraband Level (Shoulder Retraction) Level 2 (Red)   Shoulder Exercises: ROM/Strengthening   UBE (Upper Arm Bike) Level 2 2' forward 2' reverse   "  W" Arms 15X   X to V Arms 15X   Ball on Wall 1' flexion 1' abduction green ball   Rhythmic Stabilization, Supine 30 seconds with arm in 90 degrees flexion   Manual Therapy   Manual Therapy Myofascial release   Manual therapy comments Manual therapy completed prior to exercises.   Myofascial Release Myofascial release and manual stretching completed to left upper arm to decrease fascial restrictions and increase ROM in a pain free zone. manual cervical traction and myofascial release to cervical region.                  OT Short Term Goals - 04/18/16 1407    OT SHORT TERM GOAL #1   Title Pt will return to highest level of functioning and independence in daily and volunteer tasks.    Time 4   Period Weeks   Status On-going   OT SHORT TERM GOAL #2   Title Patient will decrease fascial restrictions to min amount or less in LUE to increase  functional mobility.    Time 4   Period Weeks   Status On-going   OT SHORT TERM GOAL #3   Title Patient will report a 2/10 pain or less in LUE during daily and volunteer tasks.   Time 4   Period Weeks   Status On-going   OT SHORT TERM GOAL #4   Title Pt will increase strength to 5/5 to increase ability to lift and carry heavy objects when working at [employer].    Time 4   Period Weeks   Status On-going   OT SHORT TERM GOAL #5   Title Pt will be educated on and independent  in HEP to increase functional performance during daily tasks.   Time 4   Period Weeks   Status On-going   OT SHORT TERM GOAL #6   Title Patient will increase A/ROM to WNL to increase ability to reach overhead and behind his back with less difficulty.    Time 4   Period Weeks   Status On-going                  Plan - 05/07/16 1022    Clinical Impression Statement A: Increased repetitions to 12 this session and added ball on the wall. Patient required min VC for form and technique.   Plan P: Progress back to low weights when able. Add cybex row and press.      Patient will benefit from skilled therapeutic intervention in order to improve the following deficits and impairments:  Decreased strength, Pain, Increased fascial restricitons, Decreased range of motion  Visit Diagnosis: Pain in left shoulder  Other symptoms and signs involving the musculoskeletal system  Stiffness of left shoulder, not elsewhere classified    Problem List Patient Active Problem List   Diagnosis Date Noted  . Spondylolisthesis at L5-S1 level 12/13/2015  . Midline low back pain without sciatica 12/13/2015  . Pain in joint, shoulder region 03/23/2014  . Muscle weakness (generalized) 03/23/2014  . Decreased range of motion of left shoulder 03/23/2014  . Prostate cancer ([place]) 10/31/2011  . Spondylolisthesis   . BPH (benign prostatic hyperplasia)   . Malignant hyperthermia   . Adrenal adenoma     [name], OTR/L,CBIS  [phone]  05/07/2016, 10:25 AM  [facility] [address] [place], [place], [address] Phone: [phone]   Fax:  [phone]  Name: Tracy Pratt MRN: [id number] Date  of Birth: [birth year]

## 2016-05-09 ENCOUNTER — Encounter (HOSPITAL_COMMUNITY): Payer: Self-pay

## 2016-05-09 ENCOUNTER — Ambulatory Visit (HOSPITAL_COMMUNITY): Payer: Medicare Other

## 2016-05-09 ENCOUNTER — Ambulatory Visit (INDEPENDENT_AMBULATORY_CARE_PROVIDER_SITE_OTHER): Payer: Medicare Other | Admitting: Orthopaedic Surgery

## 2016-05-09 ENCOUNTER — Encounter: Payer: Self-pay | Admitting: Orthopaedic Surgery

## 2016-05-09 VITALS — BP 119/66 | HR 64 | Temp 97.9°F | Ht 69.25 in | Wt 213.0 lb

## 2016-05-09 DIAGNOSIS — M25612 Stiffness of left shoulder, not elsewhere classified: Secondary | ICD-10-CM | POA: Diagnosis not present

## 2016-05-09 DIAGNOSIS — M542 Cervicalgia: Secondary | ICD-10-CM

## 2016-05-09 DIAGNOSIS — M25512 Pain in left shoulder: Secondary | ICD-10-CM | POA: Diagnosis not present

## 2016-05-09 DIAGNOSIS — R29898 Other symptoms and signs involving the musculoskeletal system: Secondary | ICD-10-CM

## 2016-05-09 NOTE — Patient Instructions (Signed)
Continue therapy

## 2016-05-09 NOTE — Therapy (Signed)
[place], [place], [address] Phone: [phone]   Fax:  [phone]  Occupational Therapy Treatment  Patient Details  Name: Tracy Pratt MRN: [id number] Date of Birth: 10-May-1931 Referring Provider: Sanjuana Kava, MD  Encounter Date: 05/09/2016      OT End of Session - 05/09/16 0945    Visit Number 8   Number of Visits 9   Date for OT Re-Evaluation 05/23/16   Authorization Type Medicare/Aetna Plan F   Authorization Time Period before 18th visit   Authorization - Visit Number 8   Authorization - Number of Visits 10   OT Start Time [phone]   OT Stop Time 0945   OT Time Calculation (min) 42 min   Activity Tolerance Patient tolerated treatment well   Behavior During Therapy [facility] for tasks assessed/performed      Past Medical History  Diagnosis Date  . BPH (benign prostatic hyperplasia)   . Malignant hyperthermia   . Adrenal adenoma     left  . Allergy   . Cataract 2007&2009    both   . Arthritis     back,hip  . Hypertension   . Spondylolisthesis     l5-s1  . Prostate cancer ([place]) 08/08/2007    seed implant and external rad tx    Past Surgical History  Procedure Laterality Date  . Transurethral resection of prostate  1997  . Cataract extraction  2007  . Corneal transplant  2007  . Knee surgery  1996    left knee arthoscopic  . Back surgery  1979  . Torn meniscus left knee  1996    There were no vitals filed for this visit.      Subjective Assessment - 05/09/16 0932    Subjective  S: I'd like to see what Dr. Luna Glasgow says before I decide to continue therapy.    Currently in Pain? Yes   Pain Score 2    Pain Location Shoulder   Pain Orientation Left   Pain Descriptors / Indicators Aching   Pain Type Acute pain   Pain Radiating Towards N/A   Pain Onset In the past 7 days   Pain Frequency Occasional            [facility] OT Assessment - 05/09/16 0905    Assessment   Diagnosis Left  shoulder pain   Precautions   Precautions None   AROM   Overall AROM Comments Assessed seated. IR/er abducted   AROM Assessment Site Shoulder   Right/Left Shoulder Left   Left Shoulder Flexion 155 Degrees  previous: 132   Left Shoulder ABduction 155 Degrees  previous: 142   Left Shoulder Internal Rotation 70 Degrees  previous; 60   Left Shoulder External Rotation 90 Degrees  previous: 85   PROM   Overall PROM Comments Assessed supine. IR/er abducted.   PROM Assessment Site Shoulder   Right/Left Shoulder Left   Left Shoulder Flexion 180 Degrees  previous: 150   Left Shoulder ABduction 180 Degrees  previous: same   Left Shoulder Internal Rotation 90 Degrees  previous: 75   Left Shoulder External Rotation 76 Degrees  previous: 76   Strength   Overall Strength Comments Assessed seated. IR/er abducted   Strength Assessment Site Shoulder   Right/Left Shoulder Left   Left Shoulder Flexion 4/5  previous: 4/5   Left Shoulder ABduction 4/5  previous: 4/5   Left Shoulder Internal Rotation 5/5  previous: 5/5   Left Shoulder  External Rotation 4/5  previous:4/5                  OT Treatments/Exercises (OP) - 05/09/16 0933    Exercises   Exercises Shoulder   Shoulder Exercises: Supine   Protraction PROM;5 reps;Strengthening;12 reps   Protraction Weight (lbs) 1   Horizontal ABduction PROM;5 reps;Strengthening;12 reps   Horizontal ABduction Weight (lbs) 1   External Rotation PROM;5 reps;Strengthening;12 reps   External Rotation Weight (lbs) 2   Internal Rotation PROM;5 reps;Strengthening;12 reps   Internal Rotation Weight (lbs) 2   Flexion PROM;5 reps;Strengthening;12 reps   Shoulder Flexion Weight (lbs) 2   ABduction PROM;5 reps;AROM;12 reps   Shoulder ABduction Weight (lbs) 2   Shoulder Exercises: Standing   Protraction Strengthening;12 reps   Protraction Weight (lbs) 2   Horizontal ABduction Strengthening;12 reps   Horizontal ABduction Weight (lbs) 2    External Rotation Strengthening;12 reps   External Rotation Weight (lbs) 2   Internal Rotation Strengthening;12 reps   Internal Rotation Weight (lbs) 2   Flexion Strengthening;12 reps;Limitations   Shoulder Flexion Weight (lbs) 2 and 1   Flexion Limitations Completed 4X with 2# and finished with 1#   ABduction Strengthening;12 reps   Shoulder ABduction Weight (lbs) 1   Shoulder Exercises: ROM/Strengthening   Proximal Shoulder Strengthening, Supine 12X with 2#   Proximal Shoulder Strengthening, Seated 10X with 1#   Manual Therapy   Manual Therapy Myofascial release   Manual therapy comments Manual therapy completed prior to exercises.   Myofascial Release Myofascial release and manual stretching completed to left upper arm to decrease fascial restrictions and increase ROM in a pain free zone. manual cervical traction and myofascial release to cervical region.                OT Education - 05/09/16 0930    Education provided Yes   Education Details Patient given print out of recent measurements taken at today's appointment for follow up visit with MD.    Terence Lux) Educated Patient   Methods Handout   Comprehension Verbalized understanding          OT Short Term Goals - 05/09/16 0926    OT SHORT TERM GOAL #1   Title Pt will return to highest level of functioning and independence in daily and volunteer tasks.    Time 4   Period Weeks   Status On-going   OT SHORT TERM GOAL #2   Title Patient will decrease fascial restrictions to min amount or less in LUE to increase functional mobility.    Time 4   Period Weeks   Status Achieved   OT SHORT TERM GOAL #3   Title Patient will report a 2/10 pain or less in LUE during daily and volunteer tasks.   Time 4   Period Weeks   Status Achieved   OT SHORT TERM GOAL #4   Title Pt will increase strength to 5/5 to increase ability to lift and carry heavy objects when working at [employer].    Time 4   Period Weeks   Status  On-going   OT SHORT TERM GOAL #5   Title Pt will be educated on and independent  in HEP to increase functional performance during daily tasks.   Time 4   Period Weeks   Status Achieved   OT SHORT TERM GOAL #6   Title Patient will increase A/ROM to WNL to increase ability to reach overhead and behind his back with less difficulty.  Time 4   Period Weeks   Status Achieved                  Plan - 05/09/16 0315    Clinical Impression Statement A: Mini reassessment completes this date. patient has met 4/6 therapy goals. Contiues to have deficits related to strength. Pt reports that his pain has decreased since starting therapy. Unsure about strength as he asks for assistance when needing to lift heavy items at the [employer]. Progressed back to weight this session and patient performed well. VC for form and technique.    OT Frequency 2x / week   OT Duration 2 weeks   Plan P: Recommend continuing therapy for 2 more weeks to focus on strengthening.       Patient will benefit from skilled therapeutic intervention in order to improve the following deficits and impairments:  Decreased strength, Pain, Increased fascial restricitons, Decreased range of motion  Visit Diagnosis: Other symptoms and signs involving the musculoskeletal system - Plan: Ot plan of care cert/re-cert  Pain in left shoulder - Plan: Ot plan of care cert/re-cert  Stiffness of left shoulder, not elsewhere classified - Plan: Ot plan of care cert/re-cert      G-Codes - 94/58/59 0946    Functional Assessment Tool Used FOTO score: 81/100 (19% impaired)   Functional Limitation Carrying, moving and handling objects   Carrying, Moving and Handling Objects Current Status (Y9244) At least 1 percent but less than 20 percent impaired, limited or restricted   Carrying, Moving and Handling Objects Goal Status (Q2863) At least 1 percent but less than 20 percent impaired, limited or restricted      Problem List Patient  Active Problem List   Diagnosis Date Noted  . Spondylolisthesis at L5-S1 level 12/13/2015  . Midline low back pain without sciatica 12/13/2015  . Pain in joint, shoulder region 03/23/2014  . Muscle weakness (generalized) 03/23/2014  . Decreased range of motion of left shoulder 03/23/2014  . Prostate cancer ([place]) 10/31/2011  . Spondylolisthesis   . BPH (benign prostatic hyperplasia)   . Malignant hyperthermia   . Adrenal adenoma     [name], OTR/L,CBIS  [phone]  05/09/2016, 9:49 AM  [place] [address] [place], [place], [address] Phone: [phone]   Fax:  [phone]  Name: Tracy Pratt MRN: [id number] Date of Birth: 07-19-1931

## 2016-05-09 NOTE — Progress Notes (Signed)
Patient [id number] C Yiovanni Gartman, male DOB:04-Nov-1930, 82 y.o. [id number]  Chief Complaint  Patient presents with  . Follow-up    left shoulder    HPI  Rickye Chai is a 82 y.o. male who has neck and left shoulder pain.  He has been going to OT and is improved.  He has less pain.  He still has some pain but much less and his motion is improved.  He feels better. He neck hurts now only when he extends it to put in eye drops.  He has no paresthesias.  HPI  Body mass index is 31.23 kg/(m^2).  ROS  Review of Systems  Constitutional:       Patient does not have Diabetes Mellitus. Patient has hypertension. Patient does not have COPD or shortness of breath. Patient does not have BMI > 35. Patient does not have current smoking history.  HENT: Negative for congestion.   Eyes:       Post cataract surgery, doing well   Respiratory: Negative for cough and shortness of breath.   Cardiovascular: Negative for chest pain and leg swelling.       Malignant hyperthermia with anesthesia  Endocrine: Negative for cold intolerance.  Genitourinary:       Post prostate cancer with treatment, doing well.  Musculoskeletal: Positive for back pain and arthralgias (prior of knees and shoulders, resolved for now, recurrent).  Allergic/Immunologic: Negative for environmental allergies.    Past Medical History  Diagnosis Date  . BPH (benign prostatic hyperplasia)   . Malignant hyperthermia   . Adrenal adenoma     left  . Allergy   . Cataract 2007&2009    both   . Arthritis     back,hip  . Hypertension   . Spondylolisthesis     l5-s1  . Prostate cancer ([place]) 08/08/2007    seed implant and external rad tx    Past Surgical History  Procedure Laterality Date  . Transurethral resection of prostate  1997  . Cataract extraction  2007  . Corneal transplant  2007  . Knee surgery  1996    left knee arthoscopic  . Back surgery  1979  . Torn meniscus left knee  1996    Family History   Problem Relation Age of Onset  . Prostate cancer Brother   . Lung disease Brother     lobectomy    Social History Social History  Substance Use Topics  . Smoking status: Former Smoker -- 1.00 packs/day for 15 years    Types: Cigarettes  . Smokeless tobacco: None     Comment: quit 28 years ago  . Alcohol Use: No    Allergies  Allergen Reactions  . Nsaids Anaphylaxis  . Desflurane     Malignant hyperthermia  . Enflurane     Malignant hyperthermia  . Halothane     Malignant hyperthermia  . Isoflurane     Malignant hyperthermia  . Sevoflurane     Malignant hyperthermia  . Succinylcholine     Malignant hyperthermia    Current Outpatient Prescriptions  Medication Sig Dispense Refill  . furosemide (LASIX) 20 MG tablet Take 20 mg by mouth daily as needed for fluid.    Marland Kitchen HYDROcodone-acetaminophen (NORCO) 7.5-325 MG tablet Take 1 tablet by mouth every 6 (six) hours as needed for moderate pain.    Marland Kitchen lisinopril (PRINIVIL,ZESTRIL) 10 MG tablet Take 10 mg by mouth daily with breakfast.    . loteprednol (LOTEMAX) 0.5 % ophthalmic suspension Place 1  drop into both eyes at bedtime.     . methocarbamol (ROBAXIN) 500 MG tablet Take 1 tablet (500 mg total) by mouth 3 (three) times daily. 60 tablet 1   No current facility-administered medications for this visit.     Physical Exam  Blood pressure 119/66, pulse 64, temperature 97.9 F (36.6 C), height 5' 9.25" (1.759 m), weight 213 lb (96.616 kg).  Constitutional: overall normal hygiene, normal nutrition, well developed, normal grooming, normal body habitus. Assistive device:none  Musculoskeletal: gait and station Limp none, muscle tone and strength are normal, no tremors or atrophy is present.  .  Neurological: coordination overall normal.  Deep tendon reflex/nerve stretch intact.  Sensation normal.  Cranial nerves II-XII intact.   Skin:   normal overall no scars, lesions, ulcers or rashes. No psoriasis.  Psychiatric: Alert and  oriented x 3.  Recent memory intact, remote memory unclear.  Normal mood and affect. Well groomed.  Good eye contact.  Cardiovascular: overall no swelling, no varicosities, no edema bilaterally, normal temperatures of the legs and arms, no clubbing, cyanosis and good capillary refill.  Lymphatic: palpation is normal.  Neck with full motion. Examination of left Upper Extremity is done.  Inspection:   Overall:  Elbow non-tender without crepitus or defects, forearm non-tender without crepitus or defects, wrist non-tender without crepitus or defects, hand non-tender.    Shoulder: with glenohumeral joint tenderness, without effusion.   Upper arm: without swelling and tenderness   Range of motion:   Overall:  Full range of motion of the elbow, full range of motion of wrist and full range of motion in fingers.   Shoulder:  right  155 degrees forward flexion; 155 degrees abduction; 70 degrees internal rotation, 90 degrees external rotation, 20 degrees extension, 40 degrees adduction.   Stability:   Overall:  Shoulder, elbow and wrist stable   Strength and Tone:   Overall full shoulder muscles strength, full upper arm strength and normal upper arm bulk and tone.   The patient has been educated about the nature of the problem(s) and counseled on treatment options.  The patient appeared to understand what I have discussed and is in agreement with it.  Encounter Diagnoses  Name Primary?  . Left shoulder pain Yes  . Neck pain     PLAN Call if any problems.  Precautions discussed.  Continue current medications.   Return to clinic 2 weeks   Continue OT.  I will hold off on MRI of the neck as he is improved.  Electronically Signed Sanjuana Kava, MD 7/12/201710:29 AM

## 2016-05-09 NOTE — Patient Instructions (Signed)
Name: Vue Beyler. DOB: 2030/11/07  05/09/16 0905  Assessment  Diagnosis Left shoulder pain  Precautions  Precautions None  AROM  Overall AROM Comments Assessed seated. IR/er abducted  AROM Assessment Site Shoulder  Right/Left Shoulder Left  Left Shoulder Flexion 155 Degrees (previous: 132)  Left Shoulder ABduction 155 Degrees (previous: 142)  Left Shoulder Internal Rotation 70 Degrees (previous; 60)  Left Shoulder External Rotation 90 Degrees (previous: 85)  PROM  Overall PROM Comments Assessed supine. IR/er abducted.  PROM Assessment Site Shoulder  Right/Left Shoulder Left  Left Shoulder Flexion 180 Degrees (previous: 150)  Left Shoulder ABduction 180 Degrees (previous: same)  Left Shoulder Internal Rotation 90 Degrees (previous: 75)  Left Shoulder External Rotation 76 Degrees (previous: 76)  Strength  Overall Strength Comments Assessed seated. IR/er abducted  Strength Assessment Site Shoulder  Right/Left Shoulder Left  Left Shoulder Flexion 4/5 (previous: same)  Left Shoulder ABduction 4/5 (previous: same)  Left Shoulder Internal Rotation 5/5 (previous: same)  Left Shoulder External Rotation 4/5 (previous:same)  Plan: Recommend continuing therapy for 2 more weeks to focus on strengthening.

## 2016-05-10 ENCOUNTER — Telehealth: Payer: Self-pay | Admitting: Orthopaedic Surgery

## 2016-05-10 NOTE — Telephone Encounter (Signed)
Rx done. 

## 2016-05-14 ENCOUNTER — Ambulatory Visit (HOSPITAL_COMMUNITY): Payer: Medicare Other | Admitting: Specialist

## 2016-05-14 DIAGNOSIS — M25512 Pain in left shoulder: Secondary | ICD-10-CM | POA: Diagnosis not present

## 2016-05-14 DIAGNOSIS — R29898 Other symptoms and signs involving the musculoskeletal system: Secondary | ICD-10-CM

## 2016-05-14 DIAGNOSIS — M25612 Stiffness of left shoulder, not elsewhere classified: Secondary | ICD-10-CM | POA: Diagnosis not present

## 2016-05-14 NOTE — Therapy (Signed)
[place], [place], [address] Phone: [phone]   Fax:  [phone]  Occupational Therapy Treatment  Patient Details  Name: Tracy Pratt MRN: [id number] Date of Birth: 09/05/1931 Referring Provider: Sanjuana Kava, MD  Encounter Date: 05/14/2016      OT End of Session - 05/14/16 0951    Visit Number 9   Number of Visits 13   Date for OT Re-Evaluation 05/23/16   Authorization Type Medicare/Aetna Plan F   Authorization Time Period before 18th visit   Authorization - Visit Number 9   Authorization - Number of Visits 18   OT Start Time 0900   OT Stop Time 0945   OT Time Calculation (min) 45 min   Activity Tolerance Patient tolerated treatment well   Behavior During Therapy [facility] for tasks assessed/performed      Past Medical History  Diagnosis Date  . BPH (benign prostatic hyperplasia)   . Malignant hyperthermia   . Adrenal adenoma     left  . Allergy   . Cataract 2007&2009    both   . Arthritis     back,hip  . Hypertension   . Spondylolisthesis     l5-s1  . Prostate cancer ([place]) 08/08/2007    seed implant and external rad tx    Past Surgical History  Procedure Laterality Date  . Transurethral resection of prostate  1997  . Cataract extraction  2007  . Corneal transplant  2007  . Knee surgery  1996    left knee arthoscopic  . Back surgery  1979  . Torn meniscus left knee  1996    There were no vitals filed for this visit.      Subjective Assessment - 05/14/16 0900    Subjective  S:  Dr. Luna Glasgow said I could continue strengthening for 2 more weeks.   Currently in Pain? No/denies   Pain Score 0-No pain            [facility] OT Assessment - 05/14/16 0001    Assessment   Diagnosis Left shoulder pain   Precautions   Precautions None                  OT Treatments/Exercises (OP) - 05/14/16 0001    Exercises   Exercises Shoulder   Shoulder Exercises: Supine   Protraction Strengthening;15 reps   Protraction Weight (lbs) 2   Horizontal ABduction Strengthening;15 reps   Horizontal ABduction Weight (lbs) 2   External Rotation Strengthening;15 reps   External Rotation Weight (lbs) 2   Internal Rotation Strengthening;15 reps   Internal Rotation Weight (lbs) 2   Flexion Strengthening;15 reps   Shoulder Flexion Weight (lbs) 2   ABduction Strengthening;15 reps   Shoulder ABduction Weight (lbs) 2   Shoulder Exercises: Sidelying   External Rotation Strengthening;10 reps   External Rotation Weight (lbs) 2   Internal Rotation Strengthening;10 reps   Internal Rotation Weight (lbs) 2   Flexion Strengthening;10 reps   Flexion Weight (lbs) 2   ABduction Strengthening;10 reps   ABduction Weight (lbs) 2   Other Sidelying Exercises protraction 10 times with 2#    Shoulder Exercises: Standing   Protraction Strengthening;12 reps   Protraction Weight (lbs) 2   Horizontal ABduction Strengthening;12 reps   Horizontal ABduction Weight (lbs) 2   External Rotation Strengthening;12 reps   External Rotation Weight (lbs) 2   Internal Rotation Strengthening;12 reps   Internal Rotation Weight (lbs) 2   Flexion  Strengthening;12 reps   Shoulder Flexion Weight (lbs) 2   ABduction Strengthening;12 reps   Shoulder ABduction Weight (lbs) 2   Shoulder Exercises: ROM/Strengthening   UBE (Upper Arm Bike) level 3 3' forward and 3' reverse   Cybex Press 3 plate;10 reps   Cybex Row 3 plate;10 reps   Wall Pushups 15 reps   Proximal Shoulder Strengthening, Supine 12X with 2#   Proximal Shoulder Strengthening, Seated 10X with 2#   Other ROM/Strengthening Exercises therapy ball overhead press, chest press, flexion, overhead v, chest v, flexion, elbow flexion 10 times with therapy ball                  OT Short Term Goals - 05/09/16 0926    OT SHORT TERM GOAL #1   Title Pt will return to highest level of functioning and independence in daily and volunteer tasks.     Time 4   Period Weeks   Status On-going   OT SHORT TERM GOAL #2   Title Patient will decrease fascial restrictions to min amount or less in LUE to increase functional mobility.    Time 4   Period Weeks   Status Achieved   OT SHORT TERM GOAL #3   Title Patient will report a 2/10 pain or less in LUE during daily and volunteer tasks.   Time 4   Period Weeks   Status Achieved   OT SHORT TERM GOAL #4   Title Pt will increase strength to 5/5 to increase ability to lift and carry heavy objects when working at [employer].    Time 4   Period Weeks   Status On-going   OT SHORT TERM GOAL #5   Title Pt will be educated on and independent  in HEP to increase functional performance during daily tasks.   Time 4   Period Weeks   Status Achieved   OT SHORT TERM GOAL #6   Title Patient will increase A/ROM to WNL to increase ability to reach overhead and behind his back with less difficulty.    Time 4   Period Weeks   Status Achieved                  Plan - 05/14/16 [id number]    Clinical Impression Statement A:  Focused on right arm strengthening this date, able to complete all shoulder strengthening in supine and sidelying and standing with 2#.    Plan P:  add prone scapular strengthening, modified push up.        Patient will benefit from skilled therapeutic intervention in order to improve the following deficits and impairments:     Visit Diagnosis: Other symptoms and signs involving the musculoskeletal system  Pain in left shoulder  Stiffness of left shoulder, not elsewhere classified    Problem List Patient Active Problem List   Diagnosis Date Noted  . Spondylolisthesis at L5-S1 level 12/13/2015  . Midline low back pain without sciatica 12/13/2015  . Pain in joint, shoulder region 03/23/2014  . Muscle weakness (generalized) 03/23/2014  . Decreased range of motion of left shoulder 03/23/2014  . Prostate cancer ([place]) 10/31/2011  . Spondylolisthesis   . BPH  (benign prostatic hyperplasia)   . Malignant hyperthermia   . Adrenal adenoma     Vangie Bicker, Anita, OTR/L [phone]  05/14/2016, 10:09 AM  [place] [address] [place], [place], [address] Phone: [phone]   Fax:  [phone]  Name: Tracy Pratt  Tracy Pratt MRN: [id number] Date of Birth: 05-21-1931

## 2016-05-16 ENCOUNTER — Encounter (HOSPITAL_COMMUNITY): Payer: Self-pay | Admitting: Occupational Therapy

## 2016-05-16 ENCOUNTER — Ambulatory Visit (HOSPITAL_COMMUNITY): Payer: Medicare Other | Admitting: Occupational Therapy

## 2016-05-16 DIAGNOSIS — R29898 Other symptoms and signs involving the musculoskeletal system: Secondary | ICD-10-CM | POA: Diagnosis not present

## 2016-05-16 DIAGNOSIS — M25512 Pain in left shoulder: Secondary | ICD-10-CM | POA: Diagnosis not present

## 2016-05-16 DIAGNOSIS — M25612 Stiffness of left shoulder, not elsewhere classified: Secondary | ICD-10-CM | POA: Diagnosis not present

## 2016-05-16 NOTE — Therapy (Signed)
[place], [place], [address] Phone: [phone]   Fax:  [phone]  Occupational Therapy Treatment  Patient Details  Name: Tracy Pratt MRN: [id number] Date of Birth: [birth year] Referring Provider: Sanjuana Kava, MD  Encounter Date: 05/16/2016      OT End of Session - 05/16/16 1254    Visit Number 10   Number of Visits 13   Date for OT Re-Evaluation 05/23/16   Authorization Type Medicare/Aetna Plan F   Authorization Time Period before 18th visit   Authorization - Visit Number 10   Authorization - Number of Visits 18   OT Start Time 1031   OT Stop Time 1116   OT Time Calculation (min) 45 min   Activity Tolerance Patient tolerated treatment well   Behavior During Therapy [facility] for tasks assessed/performed      Past Medical History  Diagnosis Date  . BPH (benign prostatic hyperplasia)   . Malignant hyperthermia   . Adrenal adenoma     left  . Allergy   . Cataract 2007&2009    both   . Arthritis     back,hip  . Hypertension   . Spondylolisthesis     l5-s1  . Prostate cancer ([place]) 08/08/2007    seed implant and external rad tx    Past Surgical History  Procedure Laterality Date  . Transurethral resection of prostate  1997  . Cataract extraction  2007  . Corneal transplant  2007  . Knee surgery  1996    left knee arthoscopic  . Back surgery  1979  . Torn meniscus left knee  1996    There were no vitals filed for this visit.      Subjective Assessment - 05/16/16 1034    Subjective  S: I feel like I should be doing more but I'm just doing what you tell me.    Currently in Pain? No/denies            [facility] OT Assessment - 05/16/16 1034    Assessment   Diagnosis Left shoulder pain   Precautions   Precautions None                  OT Treatments/Exercises (OP) - 05/16/16 1037    Exercises   Exercises Shoulder   Shoulder Exercises: Supine   Protraction  Strengthening;15 reps   Protraction Weight (lbs) 2   Horizontal ABduction Strengthening;15 reps   Horizontal ABduction Weight (lbs) 2   External Rotation Strengthening;15 reps   External Rotation Weight (lbs) 2   External Rotation Limitations abducted   Internal Rotation Strengthening;15 reps   Internal Rotation Weight (lbs) 2   Internal Rotation Limitations abducted   Flexion Strengthening;15 reps   Shoulder Flexion Weight (lbs) 2   ABduction Strengthening;15 reps   Shoulder ABduction Weight (lbs) 2   Shoulder Exercises: Sidelying   External Rotation Strengthening;15 reps   External Rotation Weight (lbs) 2   Internal Rotation Strengthening;15 reps   Internal Rotation Weight (lbs) 2   Flexion Strengthening;15 reps   Flexion Weight (lbs) 2   ABduction Strengthening;15 reps   ABduction Weight (lbs) 2   Other Sidelying Exercises protraction 15 times with 2#    Shoulder Exercises: Standing   Protraction Strengthening;15 reps   Protraction Weight (lbs) 2   Horizontal ABduction Strengthening;15 reps   Horizontal ABduction Weight (lbs) 2   External Rotation Strengthening;15 reps   External Rotation Weight (lbs) 2   Internal Rotation  Strengthening;15 reps   Internal Rotation Weight (lbs) 2   Flexion Strengthening;15 reps   Shoulder Flexion Weight (lbs) 2   ABduction Strengthening;12 reps   Shoulder ABduction Weight (lbs) 2   Shoulder Exercises: ROM/Strengthening   Cybex Press 3 plate;15 reps   Cybex Row 3 plate;15 reps   Wall Pushups 10 reps  modified on mat table   Proximal Shoulder Strengthening, Supine 15X each no rest breaks                  OT Short Term Goals - 05/09/16 0926    OT SHORT TERM GOAL #1   Title Pt will return to highest level of functioning and independence in daily and volunteer tasks.    Time 4   Period Weeks   Status On-going   OT SHORT TERM GOAL #2   Title Patient will decrease fascial restrictions to min amount or less in LUE to increase  functional mobility.    Time 4   Period Weeks   Status Achieved   OT SHORT TERM GOAL #3   Title Patient will report a 2/10 pain or less in LUE during daily and volunteer tasks.   Time 4   Period Weeks   Status Achieved   OT SHORT TERM GOAL #4   Title Pt will increase strength to 5/5 to increase ability to lift and carry heavy objects when working at [employer].    Time 4   Period Weeks   Status On-going   OT SHORT TERM GOAL #5   Title Pt will be educated on and independent  in HEP to increase functional performance during daily tasks.   Time 4   Period Weeks   Status Achieved   OT SHORT TERM GOAL #6   Title Patient will increase A/ROM to WNL to increase ability to reach overhead and behind his back with less difficulty.    Time 4   Period Weeks   Status Achieved                  Plan - 05/16/16 1255    Clinical Impression Statement A: Continued LUE strengthening this session, using 2# weight for all exercises. Increased repetitions for sidelying and cybex row/press, minimal difficulty and no increased pain reported. Pt required intermittent verbal cuing to depress shoulder. Did not add prone exercises due to time contraints.    Rehab Potential Excellent   OT Frequency 2x / week   OT Duration 2 weeks   OT Treatment/Interventions Self-care/ADL training;Ultrasound;DME and/or AE instruction;Passive range of motion;Patient/family education;Cryotherapy;Electrical Stimulation;Moist Heat;Therapeutic activities;Therapeutic exercises;Manual Therapy   Plan P: Add prone scapular strengthening      Patient will benefit from skilled therapeutic intervention in order to improve the following deficits and impairments:  Decreased strength, Pain, Increased fascial restricitons, Decreased range of motion  Visit Diagnosis: Other symptoms and signs involving the musculoskeletal system  Pain in left shoulder  Stiffness of left shoulder, not elsewhere classified    Problem  List Patient Active Problem List   Diagnosis Date Noted  . Spondylolisthesis at L5-S1 level 12/13/2015  . Midline low back pain without sciatica 12/13/2015  . Pain in joint, shoulder region 03/23/2014  . Muscle weakness (generalized) 03/23/2014  . Decreased range of motion of left shoulder 03/23/2014  . Prostate cancer ([place]) 10/31/2011  . Spondylolisthesis   . BPH (benign prostatic hyperplasia)   . Malignant hyperthermia   . Adrenal adenoma     Guadelupe Sabin, OTR/L  [phone]  05/16/2016, 12:57 PM  [place], [place], [address] Phone: [phone]   Fax:  [phone]  Name: Jaiveer Trabert MRN: [id number] Date of Birth: [birth year]

## 2016-05-21 ENCOUNTER — Ambulatory Visit (HOSPITAL_COMMUNITY): Payer: Medicare Other | Admitting: Specialist

## 2016-05-21 DIAGNOSIS — M25512 Pain in left shoulder: Secondary | ICD-10-CM | POA: Diagnosis not present

## 2016-05-21 DIAGNOSIS — M25612 Stiffness of left shoulder, not elsewhere classified: Secondary | ICD-10-CM | POA: Diagnosis not present

## 2016-05-21 DIAGNOSIS — R29898 Other symptoms and signs involving the musculoskeletal system: Secondary | ICD-10-CM | POA: Diagnosis not present

## 2016-05-21 NOTE — Therapy (Signed)
[place], [place], [address] Phone: [phone]   Fax:  [phone]  Occupational Therapy Treatment  Patient Details  Name: Tracy Pratt MRN: [id number] Date of Birth: [birth year] Referring Provider: Sanjuana Kava, MD  Encounter Date: 05/21/2016      OT End of Session - 05/21/16 0942    Visit Number 11   Number of Visits 13   Date for OT Re-Evaluation 05/23/16   OT Start Time 0904   OT Stop Time 0943   OT Time Calculation (min) 39 min   Activity Tolerance Patient tolerated treatment well   Behavior During Therapy [facility] for tasks assessed/performed      Past Medical History:  Diagnosis Date  . Adrenal adenoma    left  . Allergy   . Arthritis    back,hip  . BPH (benign prostatic hyperplasia)   . Cataract 2007&2009   both   . Hypertension   . Malignant hyperthermia   . Prostate cancer ([place]) 08/08/2007   seed implant and external rad tx  . Spondylolisthesis    l5-s1    Past Surgical History:  Procedure Laterality Date  . BACK SURGERY  1979  . CATARACT EXTRACTION  2007  . CORNEAL TRANSPLANT  2007  . KNEE SURGERY  1996   left knee arthoscopic  . torn meniscus left knee  1996  . TRANSURETHRAL RESECTION OF PROSTATE  1997    There were no vitals filed for this visit.      Subjective Assessment - 05/21/16 0904    Subjective  S:  I did all my exercises this weekend.  all of the ones that I did here.   Currently in Pain? No/denies   Pain Score 0-No pain            [facility] OT Assessment - 05/21/16 0001      Assessment   Diagnosis Left shoulder pain     Precautions   Precautions None                  OT Treatments/Exercises (OP) - 05/21/16 0001      Exercises   Exercises Shoulder     Shoulder Exercises: Supine   Protraction Strengthening;15 reps   Protraction Weight (lbs) 2   Horizontal ABduction Strengthening;15 reps   Horizontal ABduction Weight (lbs) 2   External  Rotation Strengthening;15 reps   External Rotation Weight (lbs) 2   External Rotation Limitations abducted   Internal Rotation Strengthening;15 reps   Internal Rotation Weight (lbs) 2   Internal Rotation Limitations abducted   Flexion Strengthening;15 reps   Shoulder Flexion Weight (lbs) 2   ABduction Strengthening;15 reps   Shoulder ABduction Weight (lbs) 2     Shoulder Exercises: Prone   Retraction Strengthening;10 reps   Retraction Weight (lbs) 2#   Flexion Strengthening;10 reps   Flexion Weight (lbs) 2#   Extension Strengthening;10 reps   Extension Weight (lbs) 2#   External Rotation Strengthening;10 reps   External Rotation Weight (lbs) 2#   Internal Rotation Strengthening;10 reps   Internal Rotation Weight (lbs) 2#   Horizontal ABduction 1 Strengthening;10 reps   Horizontal ABduction 1 Weight (lbs) 2#   Horizontal ABduction 2 Strengthening;10 reps   Horizontal ABduction 2 Weight (lbs) 2#     Shoulder Exercises: ROM/Strengthening   UBE (Upper Arm Bike) level 3 3' forward and 3' reverse   Cybex Press 3 plate;15 reps   Cybex Row 3 plate;15 reps  Pushups 5 reps;Limitations   Pushups Limitations modified hands and knee position   "W" Arms 10 times with 2#   X to V Arms 10 times with 2#   Proximal Shoulder Strengthening, Seated 10X with 2#   Other ROM/Strengthening Exercises therapy ball overhead press, chest press, flexion, overhead v, chest v, flexion, elbow flexion 10 times with therapy ball                  OT Short Term Goals - 05/21/16 0944      OT SHORT TERM GOAL #1   Title Pt will return to highest level of functioning and independence in daily and volunteer tasks.    Time 4   Period Weeks   Status On-going     OT SHORT TERM GOAL #2   Title Patient will decrease fascial restrictions to min amount or less in LUE to increase functional mobility.    Time 4   Period Weeks   Status Achieved     OT SHORT TERM GOAL #3   Title Patient will report a  2/10 pain or less in LUE during daily and volunteer tasks.   Time 4   Period Weeks   Status Achieved     OT SHORT TERM GOAL #4   Title Pt will increase strength to 5/5 to increase ability to lift and carry heavy objects when working at [employer].    Time 4   Period Weeks   Status On-going     OT SHORT TERM GOAL #5   Title Pt will be educated on and independent  in HEP to increase functional performance during daily tasks.   Time 4   Period Weeks   Status Achieved     OT SHORT TERM GOAL #6   Title Patient will increase A/ROM to WNL to increase ability to reach overhead and behind his back with less difficulty.    Time 4   Period Weeks   Status Achieved                  Plan - 05/21/16 [id number]    Clinical Impression Statement A: added modified pushup this date and prone strengthening with 2#. Required max vg with scapular strengthening exercises for technique.taken today    OT Treatment/Interventions Self-care/ADL training;Ultrasound;DME and/or AE instruction;Passive range of motion;Patient/family education;Cryotherapy;Electrical Stimulation;Moist Heat;Therapeutic activities;Therapeutic exercises;Manual Therapy   Plan P:  Possible dc pending MD visit      Patient will benefit from skilled therapeutic intervention in order to improve the following deficits and impairments:  Decreased strength, Pain, Increased fascial restricitons, Decreased range of motion  Visit Diagnosis: Other symptoms and signs involving the musculoskeletal system  Pain in left shoulder  Stiffness of left shoulder, not elsewhere classified    Problem List Patient Active Problem List   Diagnosis Date Noted  . Spondylolisthesis at L5-S1 level 12/13/2015  . Midline low back pain without sciatica 12/13/2015  . Pain in joint, shoulder region 03/23/2014  . Muscle weakness (generalized) 03/23/2014  . Decreased range of motion of left shoulder 03/23/2014  . Prostate cancer ([place]) 10/31/2011  .  Spondylolisthesis   . BPH (benign prostatic hyperplasia)   . Malignant hyperthermia   . Adrenal adenoma     Lynelle Doctor, OTR/L [phone]  05/21/2016, 9:44 AM  [place] [address] [place], [place], [address] Phone: [phone]   Fax:  [phone]  Name: Tracy Pratt MRN: [id number] Date of  Birth: Oct 06, 1931

## 2016-05-22 ENCOUNTER — Encounter: Payer: Self-pay | Admitting: Orthopaedic Surgery

## 2016-05-22 ENCOUNTER — Ambulatory Visit (INDEPENDENT_AMBULATORY_CARE_PROVIDER_SITE_OTHER): Payer: Medicare Other | Admitting: Orthopaedic Surgery

## 2016-05-22 VITALS — BP 153/83 | HR 62 | Temp 98.1°F | Ht 69.25 in | Wt 219.8 lb

## 2016-05-22 DIAGNOSIS — M25512 Pain in left shoulder: Secondary | ICD-10-CM | POA: Diagnosis not present

## 2016-05-22 DIAGNOSIS — M542 Cervicalgia: Secondary | ICD-10-CM | POA: Diagnosis not present

## 2016-05-22 MED ORDER — HYDROCODONE-ACETAMINOPHEN 7.5-325 MG PO TABS: 1.0000 | ORAL_TABLET | ORAL | 0 refills | Status: DC | PRN

## 2016-05-22 NOTE — Progress Notes (Signed)
Patient [id number] Tracy Pratt, male DOB:19-Sep-1931, 82 y.o. [id number]  Chief Complaint  Patient presents with  . Follow-up    Neck and shoulder pain    HPI  Tracy Pratt is a 82 y.o. male who has had left shoulder pain and left sided neck pain and trapezius pain on the left.  He is much improved.  He has been to OT and it has really helped.  He has no paresthesias. HPI  There is no height or weight on file to calculate BMI.  ROS  Review of Systems  Constitutional:       Patient does not have Diabetes Mellitus. Patient has hypertension. Patient does not have COPD or shortness of breath. Patient does not have BMI > 35. Patient does not have current smoking history.  HENT: Negative for congestion.   Eyes:       Post cataract surgery, doing well   Respiratory: Negative for cough and shortness of breath.   Cardiovascular: Negative for chest pain and leg swelling.       Malignant hyperthermia with anesthesia  Endocrine: Negative for cold intolerance.  Genitourinary:       Post prostate cancer with treatment, doing well.  Musculoskeletal: Positive for arthralgias (prior of knees and shoulders, resolved for now, recurrent) and back pain.  Allergic/Immunologic: Negative for environmental allergies.    Past Medical History:  Diagnosis Date  . Adrenal adenoma    left  . Allergy   . Arthritis    back,hip  . BPH (benign prostatic hyperplasia)   . Cataract 2007&2009   both   . Hypertension   . Malignant hyperthermia   . Prostate cancer ([place]) 08/08/2007   seed implant and external rad tx  . Spondylolisthesis    l5-s1    Past Surgical History:  Procedure Laterality Date  . BACK SURGERY  1979  . CATARACT EXTRACTION  2007  . CORNEAL TRANSPLANT  2007  . KNEE SURGERY  1996   left knee arthoscopic  . torn meniscus left knee  1996  . TRANSURETHRAL RESECTION OF PROSTATE  1997    Family History  Problem Relation Age of Onset  . Prostate cancer Brother   . Lung  disease Brother     lobectomy    Social History Social History  Substance Use Topics  . Smoking status: Former Smoker    Packs/day: 1.00    Years: 15.00    Types: Cigarettes  . Smokeless tobacco: Not on file     Comment: quit 28 years ago  . Alcohol use No    Allergies  Allergen Reactions  . Nsaids Anaphylaxis  . Desflurane     Malignant hyperthermia  . Enflurane     Malignant hyperthermia  . Halothane     Malignant hyperthermia  . Isoflurane     Malignant hyperthermia  . Sevoflurane     Malignant hyperthermia  . Succinylcholine     Malignant hyperthermia    Current Outpatient Prescriptions  Medication Sig Dispense Refill  . furosemide (LASIX) 20 MG tablet Take 20 mg by mouth daily as needed for fluid.    Marland Kitchen HYDROcodone-acetaminophen (NORCO) 7.5-325 MG tablet Take 1 tablet by mouth every 4 (four) hours as needed for moderate pain (Must last 30 days.  Do not drive or operate machinery while taking this medicine.). 120 tablet 0  . lisinopril (PRINIVIL,ZESTRIL) 10 MG tablet Take 10 mg by mouth daily with breakfast.    . loteprednol (LOTEMAX) 0.5 % ophthalmic suspension Place  1 drop into both eyes at bedtime.     . methocarbamol (ROBAXIN) 500 MG tablet TAKE (1) TABLET BY MOUTH (3) TIMES DAILY. 60 tablet 1   No current facility-administered medications for this visit.      Physical Exam  There were no vitals taken for this visit.  Constitutional: overall normal hygiene, normal nutrition, well developed, normal grooming, normal body habitus. Assistive device:none  Musculoskeletal: gait and station Limp none, muscle tone and strength are normal, no tremors or atrophy is present.  .  Neurological: coordination overall normal.  Deep tendon reflex/nerve stretch intact.  Sensation normal.  Cranial nerves II-XII intact.   Skin:   normal overall no scars, lesions, ulcers or rashes. No psoriasis.  Psychiatric: Alert and oriented x 3.  Recent memory intact, remote memory  unclear.  Normal mood and affect. Well groomed.  Good eye contact.  Cardiovascular: overall no swelling, no varicosities, no edema bilaterally, normal temperatures of the legs and arms, no clubbing, cyanosis and good capillary refill.  Lymphatic: palpation is normal.  Left and right shoulders have full ROM and no pain.  Nv intact.  Strength and tone normal.  Neck has full motion and no pain.  NV intact.  The patient has been educated about the nature of the problem(s) and counseled on treatment options.  The patient appeared to understand what I have discussed and is in agreement with it.  Encounter Diagnoses  Name Primary?  . Left shoulder pain Yes  . Neck pain     PLAN Call if any problems.  Precautions discussed.  Continue current medications.   Return to clinic PRN   Electronically Signed Sanjuana Kava, MD 7/25/201710:54 AM

## 2016-05-23 ENCOUNTER — Ambulatory Visit (HOSPITAL_COMMUNITY): Payer: Medicare Other

## 2016-05-23 DIAGNOSIS — M25612 Stiffness of left shoulder, not elsewhere classified: Secondary | ICD-10-CM | POA: Diagnosis not present

## 2016-05-23 DIAGNOSIS — R29898 Other symptoms and signs involving the musculoskeletal system: Secondary | ICD-10-CM | POA: Diagnosis not present

## 2016-05-23 DIAGNOSIS — M25512 Pain in left shoulder: Secondary | ICD-10-CM | POA: Diagnosis not present

## 2016-05-23 NOTE — Therapy (Signed)
[place], [place], [address] Phone: [phone]   Fax:  [phone]  Occupational Therapy Treatment And reassessment Patient Details  Name: Tracy Pratt MRN: [id number] Date of Birth: [birth year] Referring Provider: Sanjuana Kava, MD  Encounter Date: 05/23/2016      OT End of Session - 05/23/16 1212    Visit Number 12   Number of Visits 13   Authorization Type Medicare/Aetna Plan F   Authorization Time Period before 18th visit   Authorization - Visit Number 12   Authorization - Number of Visits 18   OT Start Time 1120   OT Stop Time 1150   OT Time Calculation (min) 30 min   Activity Tolerance Patient tolerated treatment well   Behavior During Therapy [facility] for tasks assessed/performed      Past Medical History:  Diagnosis Date  . Adrenal adenoma    left  . Allergy   . Arthritis    back,hip  . BPH (benign prostatic hyperplasia)   . Cataract 2007&2009   both   . Hypertension   . Malignant hyperthermia   . Prostate cancer ([place]) 08/08/2007   seed implant and external rad tx  . Spondylolisthesis    l5-s1    Past Surgical History:  Procedure Laterality Date  . BACK SURGERY  1979  . CATARACT EXTRACTION  2007  . CORNEAL TRANSPLANT  2007  . KNEE SURGERY  1996   left knee arthoscopic  . torn meniscus left knee  1996  . TRANSURETHRAL RESECTION OF PROSTATE  1997    There were no vitals filed for this visit.      Subjective Assessment - 05/23/16 1203    Subjective  S: I went to see Dr. Luna Glasgow. He said i can be done here whenever I finish.    Special Tests FOTO score: 84/100   Currently in Pain? No/denies            [facility] OT Assessment - 05/23/16 1124      Assessment   Diagnosis Left shoulder pain     Precautions   Precautions None     AROM   Overall AROM Comments Assessed standing. IR/er abducted   AROM Assessment Site Shoulder   Right/Left Shoulder Left   Left Shoulder  Flexion 165 Degrees  previous: 165   Left Shoulder ABduction 170 Degrees  previous: 155   Left Shoulder Internal Rotation 75 Degrees  previous: 70   Left Shoulder External Rotation 85 Degrees  previous: 90     PROM   Overall PROM  Within functional limits for tasks performed     Strength   Overall Strength Comments Assessed standing. IR/er abducted   Strength Assessment Site Shoulder   Right/Left Shoulder Left   Left Shoulder Flexion 5/5  previous: 4/5   Left Shoulder ABduction 5/5  previous: 4/5   Left Shoulder Internal Rotation 5/5  previous: 5/5   Left Shoulder External Rotation 5/5  previous: 4/5                  OT Treatments/Exercises (OP) - 05/23/16 1207      Exercises   Exercises Shoulder     Shoulder Exercises: Supine   Protraction PROM;5 reps   Horizontal ABduction PROM;5 reps   External Rotation PROM;5 reps  shoulder abducted   Internal Rotation PROM;5 reps  shoulder abducted   Flexion PROM;5 reps   ABduction PROM;5 reps     Manual Therapy  Manual Therapy Other (comment)   Manual therapy comments Manual therapy completed prior to exercises.   Other Manual Therapy Pectoralis release completed with lacrosse ball to increase external rotation                OT Education - 2016-06-06 1211    Education provided Yes   Education Details Discussed present HEP and frequency in which to complete.    Person(s) Educated Patient   Methods Explanation   Comprehension Verbalized understanding          OT Short Term Goals - Jun 06, 2016 1140      OT SHORT TERM GOAL #1   Title Pt will return to highest level of functioning and independence in daily and volunteer tasks.    Time 4   Period Weeks   Status Achieved     OT SHORT TERM GOAL #2   Title Patient will decrease fascial restrictions to min amount or less in LUE to increase functional mobility.    Time 4   Period Weeks   Status Achieved     OT SHORT TERM GOAL #3   Title Patient will  report a 2/10 pain or less in LUE during daily and volunteer tasks.   Time 4   Period Weeks   Status Achieved     OT SHORT TERM GOAL #4   Title Pt will increase strength to 5/5 to increase ability to lift and carry heavy objects when working at [employer].    Time 4   Period Weeks   Status Achieved     OT SHORT TERM GOAL #5   Title Pt will be educated on and independent  in HEP to increase functional performance during daily tasks.   Time 4   Period Weeks   Status Achieved     OT SHORT TERM GOAL #6   Title Patient will increase A/ROM to WNL to increase ability to reach overhead and behind his back with less difficulty.    Time 4   Period Weeks   Status Achieved                  Plan - Jun 06, 2016 1212    Clinical Impression Statement A: Assessed patient's ROM and strength this session. patient is currently presenting with A/ROM [facility] as well as 5/5 shoulder strength. Recommended discharge from therapy and patient in agreement.    Plan P: D/C from therapy.       Patient will benefit from skilled therapeutic intervention in order to improve the following deficits and impairments:  Decreased strength, Pain, Increased fascial restricitons, Decreased range of motion  Visit Diagnosis: Other symptoms and signs involving the musculoskeletal system  Stiffness of left shoulder, not elsewhere classified      G-Codes - 06-06-2016 1215    Functional Assessment Tool Used FOTO score: 84/100 (16% impaired)   Functional Limitation Carrying, moving and handling objects   Carrying, Moving and Handling Objects Goal Status (C3754) At least 1 percent but less than 20 percent impaired, limited or restricted   Carrying, Moving and Handling Objects Discharge Status [phone]) At least 1 percent but less than 20 percent impaired, limited or restricted      Problem List Patient Active Problem List   Diagnosis Date Noted  . Spondylolisthesis at L5-S1 level 12/13/2015  . Midline low back  pain without sciatica 12/13/2015  . Pain in joint, shoulder region 03/23/2014  . Muscle weakness (generalized) 03/23/2014  . Decreased range of motion of left  shoulder 03/23/2014  . Prostate cancer ([place]) 10/31/2011  . Spondylolisthesis   . BPH (benign prostatic hyperplasia)   . Malignant hyperthermia   . Adrenal adenoma   .OCCUPATIONAL THERAPY DISCHARGE SUMMARY  Visits from Start of Care: 12  Current functional level related to goals / functional outcomes: See above   Remaining deficits: See above   Education / Equipment: See above Plan: Patient agrees to discharge.  Patient goals were met. Patient is being discharged due to meeting the stated rehab goals.  ?????         [name], OTR/L,CBIS  [phone]  05/23/2016, 12:18 PM  [place], [place], [address] Phone: [phone]   Fax:  [phone]  Name: Tracy Pratt MRN: [id number] Date of Birth: October 06, 1931

## 2016-05-24 DIAGNOSIS — Z8546 Personal history of malignant neoplasm of prostate: Secondary | ICD-10-CM | POA: Diagnosis not present

## 2016-05-24 DIAGNOSIS — N411 Chronic prostatitis: Secondary | ICD-10-CM | POA: Diagnosis not present

## 2016-06-13 DIAGNOSIS — L570 Actinic keratosis: Secondary | ICD-10-CM | POA: Diagnosis not present

## 2016-06-13 DIAGNOSIS — L820 Inflamed seborrheic keratosis: Secondary | ICD-10-CM | POA: Diagnosis not present

## 2016-06-13 DIAGNOSIS — X32XXXD Exposure to sunlight, subsequent encounter: Secondary | ICD-10-CM | POA: Diagnosis not present

## 2016-08-31 DIAGNOSIS — Z23 Encounter for immunization: Secondary | ICD-10-CM | POA: Diagnosis not present

## 2016-09-25 DIAGNOSIS — H1851 Endothelial corneal dystrophy: Secondary | ICD-10-CM | POA: Diagnosis not present

## 2016-09-25 DIAGNOSIS — B029 Zoster without complications: Secondary | ICD-10-CM | POA: Diagnosis not present

## 2016-10-04 DIAGNOSIS — H10503 Unspecified blepharoconjunctivitis, bilateral: Secondary | ICD-10-CM | POA: Diagnosis not present

## 2016-12-17 DIAGNOSIS — Z0000 Encounter for general adult medical examination without abnormal findings: Secondary | ICD-10-CM | POA: Diagnosis not present

## 2016-12-17 DIAGNOSIS — Z6830 Body mass index (BMI) 30.0-30.9, adult: Secondary | ICD-10-CM | POA: Diagnosis not present

## 2016-12-17 DIAGNOSIS — Z1389 Encounter for screening for other disorder: Secondary | ICD-10-CM | POA: Diagnosis not present

## 2016-12-17 DIAGNOSIS — R7301 Impaired fasting glucose: Secondary | ICD-10-CM | POA: Diagnosis not present

## 2016-12-17 DIAGNOSIS — R160 Hepatomegaly, not elsewhere classified: Secondary | ICD-10-CM | POA: Diagnosis not present

## 2016-12-17 DIAGNOSIS — E6609 Other obesity due to excess calories: Secondary | ICD-10-CM | POA: Diagnosis not present

## 2016-12-17 DIAGNOSIS — E785 Hyperlipidemia, unspecified: Secondary | ICD-10-CM | POA: Diagnosis not present

## 2016-12-17 DIAGNOSIS — I2510 Atherosclerotic heart disease of native coronary artery without angina pectoris: Secondary | ICD-10-CM | POA: Diagnosis not present

## 2016-12-17 LAB — BASIC METABOLIC PANEL
BUN: 20 (ref 4–21)
CREATININE: 1.1 (ref <=1.3)

## 2016-12-17 LAB — LIPID PANEL: CHOLESTEROL: 202 — AB (ref 0–200)

## 2016-12-20 ENCOUNTER — Other Ambulatory Visit (HOSPITAL_COMMUNITY): Payer: Self-pay | Admitting: Internal Medicine

## 2016-12-20 DIAGNOSIS — I6529 Occlusion and stenosis of unspecified carotid artery: Secondary | ICD-10-CM

## 2016-12-24 ENCOUNTER — Ambulatory Visit (HOSPITAL_COMMUNITY)
Admission: RE | Admit: 2016-12-24 | Discharge: 2016-12-24 | Disposition: A | Discharge status: Home / Self Care | Payer: Medicare Other | Source: Ambulatory Visit | Attending: Internal Medicine | Admitting: Internal Medicine

## 2016-12-24 DIAGNOSIS — I6529 Occlusion and stenosis of unspecified carotid artery: Secondary | ICD-10-CM | POA: Diagnosis not present

## 2016-12-24 DIAGNOSIS — I6523 Occlusion and stenosis of bilateral carotid arteries: Secondary | ICD-10-CM | POA: Diagnosis not present

## 2016-12-31 DIAGNOSIS — Z6831 Body mass index (BMI) 31.0-31.9, adult: Secondary | ICD-10-CM | POA: Diagnosis not present

## 2016-12-31 DIAGNOSIS — E782 Mixed hyperlipidemia: Secondary | ICD-10-CM | POA: Diagnosis not present

## 2016-12-31 DIAGNOSIS — E6609 Other obesity due to excess calories: Secondary | ICD-10-CM | POA: Diagnosis not present

## 2017-02-21 DIAGNOSIS — J302 Other seasonal allergic rhinitis: Secondary | ICD-10-CM | POA: Diagnosis not present

## 2017-02-21 DIAGNOSIS — R062 Wheezing: Secondary | ICD-10-CM | POA: Diagnosis not present

## 2017-02-21 DIAGNOSIS — J343 Hypertrophy of nasal turbinates: Secondary | ICD-10-CM | POA: Diagnosis not present

## 2017-02-21 DIAGNOSIS — B349 Viral infection, unspecified: Secondary | ICD-10-CM | POA: Diagnosis not present

## 2017-02-21 DIAGNOSIS — E782 Mixed hyperlipidemia: Secondary | ICD-10-CM | POA: Diagnosis not present

## 2017-02-21 DIAGNOSIS — Z6830 Body mass index (BMI) 30.0-30.9, adult: Secondary | ICD-10-CM | POA: Diagnosis not present

## 2017-02-21 DIAGNOSIS — J209 Acute bronchitis, unspecified: Secondary | ICD-10-CM | POA: Diagnosis not present

## 2017-02-21 DIAGNOSIS — R070 Pain in throat: Secondary | ICD-10-CM | POA: Diagnosis not present

## 2017-04-11 DIAGNOSIS — Z0001 Encounter for general adult medical examination with abnormal findings: Secondary | ICD-10-CM | POA: Diagnosis not present

## 2017-04-11 DIAGNOSIS — I10 Essential (primary) hypertension: Secondary | ICD-10-CM | POA: Diagnosis not present

## 2017-04-11 DIAGNOSIS — E782 Mixed hyperlipidemia: Secondary | ICD-10-CM | POA: Diagnosis not present

## 2017-04-11 DIAGNOSIS — R7309 Other abnormal glucose: Secondary | ICD-10-CM | POA: Diagnosis not present

## 2017-04-11 DIAGNOSIS — Z1389 Encounter for screening for other disorder: Secondary | ICD-10-CM | POA: Diagnosis not present

## 2017-04-11 DIAGNOSIS — Z6830 Body mass index (BMI) 30.0-30.9, adult: Secondary | ICD-10-CM | POA: Diagnosis not present

## 2017-04-11 DIAGNOSIS — C61 Malignant neoplasm of prostate: Secondary | ICD-10-CM | POA: Diagnosis not present

## 2017-04-11 DIAGNOSIS — E6609 Other obesity due to excess calories: Secondary | ICD-10-CM | POA: Diagnosis not present

## 2017-04-11 DIAGNOSIS — R160 Hepatomegaly, not elsewhere classified: Secondary | ICD-10-CM | POA: Diagnosis not present

## 2017-04-15 ENCOUNTER — Other Ambulatory Visit (HOSPITAL_COMMUNITY): Payer: Self-pay | Admitting: Family Medicine

## 2017-05-13 DIAGNOSIS — Z8546 Personal history of malignant neoplasm of prostate: Secondary | ICD-10-CM | POA: Diagnosis not present

## 2017-05-13 DIAGNOSIS — N3942 Incontinence without sensory awareness: Secondary | ICD-10-CM | POA: Diagnosis not present

## 2017-05-13 DIAGNOSIS — C61 Malignant neoplasm of prostate: Secondary | ICD-10-CM | POA: Diagnosis not present

## 2017-05-23 DIAGNOSIS — E6609 Other obesity due to excess calories: Secondary | ICD-10-CM | POA: Diagnosis not present

## 2017-05-23 DIAGNOSIS — Z6831 Body mass index (BMI) 31.0-31.9, adult: Secondary | ICD-10-CM | POA: Diagnosis not present

## 2017-05-23 DIAGNOSIS — E782 Mixed hyperlipidemia: Secondary | ICD-10-CM | POA: Diagnosis not present

## 2017-06-13 ENCOUNTER — Encounter (HOSPITAL_COMMUNITY): Payer: Self-pay | Admitting: Emergency Medicine

## 2017-06-13 ENCOUNTER — Emergency Department (HOSPITAL_COMMUNITY)
Admission: EM | Admit: 2017-06-13 | Discharge: 2017-06-13 | Disposition: A | Discharge status: Home / Self Care | Payer: Medicare Other | Attending: Emergency Medicine | Admitting: Emergency Medicine

## 2017-06-13 DIAGNOSIS — Z87891 Personal history of nicotine dependence: Secondary | ICD-10-CM | POA: Insufficient documentation

## 2017-06-13 DIAGNOSIS — R55 Syncope and collapse: Secondary | ICD-10-CM | POA: Insufficient documentation

## 2017-06-13 DIAGNOSIS — R42 Dizziness and giddiness: Secondary | ICD-10-CM | POA: Diagnosis not present

## 2017-06-13 DIAGNOSIS — I10 Essential (primary) hypertension: Secondary | ICD-10-CM | POA: Insufficient documentation

## 2017-06-13 DIAGNOSIS — R404 Transient alteration of awareness: Secondary | ICD-10-CM | POA: Diagnosis not present

## 2017-06-13 DIAGNOSIS — Z8546 Personal history of malignant neoplasm of prostate: Secondary | ICD-10-CM | POA: Diagnosis not present

## 2017-06-13 DIAGNOSIS — R531 Weakness: Secondary | ICD-10-CM | POA: Diagnosis not present

## 2017-06-13 LAB — COMPREHENSIVE METABOLIC PANEL
ALT: 24 U/L (ref 17–63)
AST: 23 U/L (ref 15–41)
Albumin: 4 g/dL (ref 3.5–5.0)
Alkaline Phosphatase: 49 U/L (ref 38–126)
Anion gap: 11 (ref 5–15)
BUN: 19 mg/dL (ref 6–20)
CO2: 26 mmol/L (ref 22–32)
Calcium: 8.8 mg/dL — ABNORMAL LOW (ref 8.9–10.3)
Chloride: 97 mmol/L — ABNORMAL LOW (ref 101–111)
Creatinine, Ser: 1.33 mg/dL — ABNORMAL HIGH (ref 0.61–1.24)
GFR calc Af Amer: 55 mL/min — ABNORMAL LOW (ref >60)
GFR calc non Af Amer: 47 mL/min — ABNORMAL LOW (ref >60)
Glucose, Bld: 126 mg/dL — ABNORMAL HIGH (ref 65–99)
Potassium: 3.5 mmol/L (ref 3.5–5.1)
Sodium: 134 mmol/L — ABNORMAL LOW (ref 135–145)
Total Bilirubin: 0.9 mg/dL (ref 0.3–1.2)
Total Protein: 7 g/dL (ref 6.5–8.1)

## 2017-06-13 LAB — TROPONIN I: Troponin I: <0.03 ng/mL (ref <0.03)

## 2017-06-13 LAB — CBC WITH DIFFERENTIAL/PLATELET
Basophils Absolute: 0 10*3/uL (ref 0.0–0.1)
Basophils Relative: 0 %
Eosinophils Absolute: 0 10*3/uL (ref 0.0–0.7)
Eosinophils Relative: 0 %
HCT: 44.8 % (ref 39.0–52.0)
Hemoglobin: 15.4 g/dL (ref 13.0–17.0)
Lymphocytes Relative: 8 %
Lymphs Abs: 0.9 10*3/uL (ref 0.7–4.0)
MCH: 31.4 pg (ref 26.0–34.0)
MCHC: 34.4 g/dL (ref 30.0–36.0)
MCV: 91.2 fL (ref 78.0–100.0)
Monocytes Absolute: 0.9 10*3/uL (ref 0.1–1.0)
Monocytes Relative: 7 %
Neutro Abs: 9.6 10*3/uL — ABNORMAL HIGH (ref 1.7–7.7)
Neutrophils Relative %: 85 %
Platelets: 202 10*3/uL (ref 150–400)
RBC: 4.91 MIL/uL (ref 4.22–5.81)
RDW: 13.1 % (ref 11.5–15.5)
WBC: 11.4 10*3/uL — ABNORMAL HIGH (ref 4.0–10.5)

## 2017-06-13 LAB — LIPASE, BLOOD: Lipase: 32 U/L (ref 11–51)

## 2017-06-13 MED ORDER — SODIUM CHLORIDE 0.9 % IV BOLUS (SEPSIS)
1000.0000 mL | Freq: Once | INTRAVENOUS | Status: AC
  Administered 2017-06-13: 1000 mL via INTRAVENOUS

## 2017-06-13 NOTE — ED Notes (Signed)
Pt ate 100% of meal tray given

## 2017-06-13 NOTE — ED Provider Notes (Signed)
[facility] DEPT Provider Note   CSN: [id number] Arrival date & time: 06/13/17  1643     History   Chief Complaint Chief Complaint  Patient presents with  . Loss of Consciousness    HPI Tracy Pratt is a 83 y.o. male.  Patient is an 83 year old male with past medical history of hypertension, prostate cancer. He presents for evaluation of near syncope and diarrhea. 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 He felt very flushed and people working with him and advised him to remain seated and rest. He apparently had a large episode of incontinence of bowel and bladder, and then was transported here. He denies to me that he ever lost consciousness. He denies any chest pain. He denies any abdominal pain. He is currently asymptomatic and feels much better.   The history is provided by the patient.  Loss of Consciousness   This is a new problem. The current episode started less than 1 hour ago. The problem occurs constantly. The problem has been resolved. There was no loss of consciousness. Associated symptoms include bladder incontinence and bowel incontinence. Pertinent negatives include abdominal pain, confusion, focal weakness, headaches, light-headedness, seizures and slurred speech. He has tried nothing for the symptoms.    Past Medical History:  Diagnosis Date  . Adrenal adenoma    left  . Allergy   . Arthritis    back,hip  . BPH (benign prostatic hyperplasia)   . Cataract 2007&2009   both   . Hypertension   . Malignant hyperthermia   . Prostate cancer ([place]) 08/08/2007   seed implant and external rad tx  . Spondylolisthesis    l5-s1    Patient Active Problem List   Diagnosis Date Noted  . Spondylolisthesis at L5-S1 level 12/13/2015  . Midline low back pain without sciatica 12/13/2015  . Pain in joint, shoulder region 03/23/2014  . Muscle weakness (generalized) 03/23/2014  . Decreased  range of motion of left shoulder 03/23/2014  . Prostate cancer ([place]) 10/31/2011  . Spondylolisthesis   . BPH (benign prostatic hyperplasia)   . Malignant hyperthermia   . Adrenal adenoma     Past Surgical History:  Procedure Laterality Date  . BACK SURGERY  1979  . CATARACT EXTRACTION  2007  . CORNEAL TRANSPLANT  2007  . KNEE SURGERY  1996   left knee arthoscopic  . torn meniscus left knee  1996  . TRANSURETHRAL RESECTION OF PROSTATE  1997       Home Medications    Prior to Admission medications   Medication Sig Start Date End Date Taking? Authorizing Provider  furosemide (LASIX) 20 MG tablet Take 20 mg by mouth daily as needed for fluid.    [provider]  HYDROcodone-acetaminophen (NORCO) 7.5-325 MG tablet Take 1 tablet by mouth every 4 (four) hours as needed for moderate pain (Must last 30 days.  Do not drive or operate machinery while taking this medicine.). 05/22/16   Sanjuana Kava, MD  lisinopril (PRINIVIL,ZESTRIL) 10 MG tablet Take 10 mg by mouth daily with breakfast.    [provider]  loteprednol (LOTEMAX) 0.5 % ophthalmic suspension Place 1 drop into both eyes at bedtime.     [provider]  methocarbamol (ROBAXIN) 500 MG tablet TAKE (1) TABLET BY MOUTH (3) TIMES DAILY. 05/10/16   Sanjuana Kava, MD    Family History Family History  Problem Relation Age of Onset  .  Prostate cancer Brother   . Lung disease Brother        lobectomy    Social History Social History  Substance Use Topics  . Smoking status: Former Smoker    Packs/day: 1.00    Years: 15.00    Types: Cigarettes  . Smokeless tobacco: Not on file     Comment: quit 28 years ago  . Alcohol use No     Allergies   Nsaids; Desflurane; Enflurane; Halothane; Isoflurane; Sevoflurane; and Succinylcholine   Review of Systems Review of Systems  Cardiovascular: Positive for syncope.  Gastrointestinal: Positive for bowel incontinence. Negative for abdominal pain.    Genitourinary: Positive for bladder incontinence.  Neurological: Negative for focal weakness, seizures, light-headedness and headaches.  Psychiatric/Behavioral: Negative for confusion.  All other systems reviewed and are negative.    Physical Exam Updated Vital Signs BP 117/69 (BP Location: Right Arm)   Pulse (!) 59   Temp 97.8 F (36.6 C) (Oral)   Resp 16   Ht 5\' 9"  (1.753 m)   Wt 99.3 kg (219 lb)   SpO2 99%   BMI 32.34 kg/m   Physical Exam  Constitutional: He is oriented to person, place, and time. He appears well-developed and well-nourished. No distress.  HENT:  Head: Normocephalic and atraumatic.  Mouth/Throat: Oropharynx is clear and moist.  Eyes: Pupils are equal, round, and reactive to light. EOM are normal.  Neck: Normal range of motion. Neck supple.  Cardiovascular: Normal rate and regular rhythm.  Exam reveals no friction rub.   No murmur heard. Pulmonary/Chest: Effort normal and breath sounds normal. No respiratory distress. He has no wheezes. He has no rales.  Abdominal: Soft. Bowel sounds are normal. He exhibits no distension. There is no tenderness.  Musculoskeletal: Normal range of motion. He exhibits no edema.  Neurological: He is alert and oriented to person, place, and time. No cranial nerve deficit. He exhibits normal muscle tone. Coordination normal.  Skin: Skin is warm and dry. He is not diaphoretic.  Nursing note and vitals reviewed.    ED Treatments / Results  Labs (all labs ordered are listed, but only abnormal results are displayed) Labs Reviewed  COMPREHENSIVE METABOLIC PANEL  LIPASE, BLOOD  CBC WITH DIFFERENTIAL/PLATELET  TROPONIN I    EKG  EKG Interpretation None       Radiology No results found.  Procedures Procedures (including critical care time)  Medications Ordered in ED Medications  sodium chloride 0.9 % bolus 1,000 mL (not administered)     Initial Impression / Assessment and Plan / ED Course  I have reviewed  the triage vital signs and the nursing notes.  Pertinent labs & imaging results that were available during my care of the patient were reviewed by me and considered in my medical decision making (see chart for details).  Patient presents here for evaluation of weakness, nausea, and near syncope as described in the history of present illness. I suspect his symptoms are related to overexertion in that heat combined with some sort of GI disturbance, likely causing a vasovagal response. He now feels much improved and has no complaints. His workup is negative including laboratory studies, EKG, and troponin 2. He has been ambulatory in the department without difficulty. I've discussed the disposition with the patient and his 2 daughters at bedside. He is requesting to be discharged and does not want to be admitted. I feel comfortable with this and the family will keep an eye on him this evening. If he has  additional problems he is to return to the ER to be reevaluated.  Final Clinical Impressions(s) / ED Diagnoses   Final diagnoses:  None    New Prescriptions New Prescriptions   No medications on file     Veryl Speak, MD 06/13/17 2123

## 2017-06-13 NOTE — ED Notes (Signed)
Pt ambulated well in hall with steady gait, no dizziness.

## 2017-06-13 NOTE — ED Notes (Signed)
Pt given a meal tray and sprite per request.

## 2017-06-13 NOTE — ED Triage Notes (Signed)
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  States he went to [facility] and became very diaphoretic, nauseated, and had a syncopal episode with urination and defecation on self.  Unsure if there was any seizure activity.  Pt states he feels fine now.

## 2017-06-13 NOTE — Discharge Instructions (Signed)
Return to the emergency department if you develop chest pain, difficulty breathing, worsening dizziness, or other new and concerning symptoms.  Continue your medications as previously prescribed.

## 2017-06-25 ENCOUNTER — Ambulatory Visit (INDEPENDENT_AMBULATORY_CARE_PROVIDER_SITE_OTHER): Payer: Medicare Other

## 2017-06-25 ENCOUNTER — Ambulatory Visit (INDEPENDENT_AMBULATORY_CARE_PROVIDER_SITE_OTHER): Payer: Medicare Other | Admitting: Orthopaedic Surgery

## 2017-06-25 ENCOUNTER — Encounter: Payer: Self-pay | Admitting: Orthopaedic Surgery

## 2017-06-25 VITALS — BP 135/72 | HR 55 | Temp 97.1°F | Ht 69.25 in | Wt 212.0 lb

## 2017-06-25 DIAGNOSIS — G8929 Other chronic pain: Secondary | ICD-10-CM

## 2017-06-25 DIAGNOSIS — M5441 Lumbago with sciatica, right side: Secondary | ICD-10-CM

## 2017-06-25 DIAGNOSIS — I6529 Occlusion and stenosis of unspecified carotid artery: Secondary | ICD-10-CM

## 2017-06-25 NOTE — Progress Notes (Signed)
Patient GN:FAOZHY C Draxton Luu, male DOB:01-18-1931, 83 y.o. QMV:[id number]  Chief Complaint  Patient presents with  . Follow-up    back pain    HPI  Tracy Pratt is a 83 y.o. male who has increasing pain of the lumbar spine without sciatica.  He is unable to do any lifting now without pain.  He has pain during the evening.  He has no new trauma.  He works with the [employer] and is very limited now in his activities secondary to the pain.  He has no weakness, no bowel or bladder problem. He cannot take any NSAID.  HPI  Body mass index is 31.08 kg/m.  ROS  Review of Systems  Constitutional:       Patient does not have Diabetes Mellitus. Patient has hypertension. Patient does not have COPD or shortness of breath. Patient does not have BMI > 35. Patient does not have current smoking history.  HENT: Negative for congestion.   Eyes:       Post cataract surgery, doing well   Respiratory: Negative for cough and shortness of breath.   Cardiovascular: Negative for chest pain and leg swelling.       Malignant hyperthermia with anesthesia  Endocrine: Negative for cold intolerance.  Genitourinary:       Post prostate cancer with treatment, doing well.  Musculoskeletal: Positive for arthralgias (prior of knees and shoulders, resolved for now, recurrent) and back pain.  Allergic/Immunologic: Negative for environmental allergies.    Past Medical History:  Diagnosis Date  . Adrenal adenoma    left  . Allergy   . Arthritis    back,hip  . BPH (benign prostatic hyperplasia)   . Cataract 2007&2009   both   . Hypertension   . Malignant hyperthermia   . Prostate cancer ([place]) 08/08/2007   seed implant and external rad tx  . Spondylolisthesis    l5-s1    Past Surgical History:  Procedure Laterality Date  . BACK SURGERY  1979  . CATARACT EXTRACTION  2007  . CORNEAL TRANSPLANT  2007  . KNEE SURGERY  1996   left knee arthoscopic  . torn meniscus left knee  1996  .  TRANSURETHRAL RESECTION OF PROSTATE  1997    Family History  Problem Relation Age of Onset  . Prostate cancer Brother   . Lung disease Brother        lobectomy    Social History Social History  Substance Use Topics  . Smoking status: Former Smoker    Packs/day: 1.00    Years: 15.00    Types: Cigarettes  . Smokeless tobacco: Never Used     Comment: quit 28 years ago  . Alcohol use No    Allergies  Allergen Reactions  . Diclofenac Sodium Anaphylaxis  . Nabumetone Anaphylaxis  . Nsaids Anaphylaxis  . Bee Venom Swelling  . Desflurane     Malignant hyperthermia  . Enflurane     Malignant hyperthermia  . Halothane     Malignant hyperthermia  . Isoflurane     Malignant hyperthermia  . Sevoflurane     Malignant hyperthermia  . Succinylcholine     Malignant hyperthermia    Current Outpatient Prescriptions  Medication Sig Dispense Refill  . furosemide (LASIX) 40 MG tablet Take 40 mg by mouth daily as needed for fluid.     Marland Kitchen HYDROcodone-acetaminophen (NORCO) 7.5-325 MG tablet Take 1 tablet by mouth every 4 (four) hours as needed for moderate pain (Must last 30  days.  Do not drive or operate machinery while taking this medicine.). 120 tablet 0  . lisinopril (PRINIVIL,ZESTRIL) 10 MG tablet Take 10 mg by mouth daily with breakfast.    . loteprednol (LOTEMAX) 0.5 % ophthalmic suspension Place 1 drop into both eyes at bedtime.     . methocarbamol (ROBAXIN) 500 MG tablet TAKE (1) TABLET BY MOUTH (3) TIMES DAILY. 60 tablet 1   No current facility-administered medications for this visit.      Physical Exam  Blood pressure 135/72, pulse (!) 55, temperature (!) 97.1 F (36.2 C), height 5' 9.25" (1.759 m), weight 212 lb (96.2 kg).  Constitutional: overall normal hygiene, normal nutrition, well developed, normal grooming, normal body habitus. Assistive device:none  Musculoskeletal: gait and station Limp none, muscle tone and strength are normal, no tremors or atrophy is  present.  .  Neurological: coordination overall normal.  Deep tendon reflex/nerve stretch intact.  Sensation normal.  Cranial nerves II-XII intact.   Skin:   Normal overall no scars, lesions, ulcers or rashes. No psoriasis.  Psychiatric: Alert and oriented x 3.  Recent memory intact, remote memory unclear.  Normal mood and affect. Well groomed.  Good eye contact.  Cardiovascular: overall no swelling, no varicosities, no edema bilaterally, normal temperatures of the legs and arms, no clubbing, cyanosis and good capillary refill.  Lymphatic: palpation is normal.  Spine/Pelvis examination:  Inspection:  Overall, sacoiliac joint benign and hips nontender; without crepitus or defects.   Thoracic spine inspection: Alignment normal without kyphosis present   Lumbar spine inspection:  Alignment  with normal lumbar lordosis, without scoliosis apparent.   Thoracic spine palpation:  without tenderness of spinal processes   Lumbar spine palpation: with tenderness of lumbar area; without tightness of lumbar muscles    Range of Motion:   Lumbar flexion, forward flexion is full without pain or tenderness    Lumbar extension is full without pain or tenderness   Left lateral bend is Normal  without pain or tenderness   Right lateral bend is Normal without pain or tenderness   Straight leg raising is Normal   Strength & tone: Normal   Stability overall normal stability     The patient has been educated about the nature of the problem(s) and counseled on treatment options.  The patient appeared to understand what I have discussed and is in agreement with it.  Encounter Diagnosis  Name Primary?  . Chronic right-sided low back pain with right-sided sciatica Yes    PLAN Call if any problems.  Precautions discussed.  Continue current medications.   Return to clinic GET MRI of the lumbar spine   Electronically Signed Sanjuana Kava, MD 8/28/201810:20 AM

## 2017-06-26 ENCOUNTER — Other Ambulatory Visit: Payer: Self-pay

## 2017-06-26 ENCOUNTER — Emergency Department (HOSPITAL_COMMUNITY)
Admission: EM | Admit: 2017-06-26 | Discharge: 2017-06-26 | Disposition: A | Discharge status: Home / Self Care | Payer: Medicare Other | Attending: Emergency Medicine | Admitting: Emergency Medicine

## 2017-06-26 ENCOUNTER — Encounter (HOSPITAL_COMMUNITY): Payer: Self-pay | Admitting: Emergency Medicine

## 2017-06-26 DIAGNOSIS — R42 Dizziness and giddiness: Secondary | ICD-10-CM | POA: Diagnosis not present

## 2017-06-26 DIAGNOSIS — I10 Essential (primary) hypertension: Secondary | ICD-10-CM | POA: Insufficient documentation

## 2017-06-26 DIAGNOSIS — Z8546 Personal history of malignant neoplasm of prostate: Secondary | ICD-10-CM | POA: Insufficient documentation

## 2017-06-26 DIAGNOSIS — Z87891 Personal history of nicotine dependence: Secondary | ICD-10-CM | POA: Insufficient documentation

## 2017-06-26 LAB — BASIC METABOLIC PANEL
ANION GAP: 9 (ref 5–15)
BUN: 12 mg/dL (ref 6–20)
CALCIUM: 8.8 mg/dL — AB (ref 8.9–10.3)
CO2: 26 mmol/L (ref 22–32)
CREATININE: 1.1 mg/dL (ref 0.61–1.24)
Chloride: 102 mmol/L (ref 101–111)
GFR calc non Af Amer: 60 mL/min — ABNORMAL LOW (ref >60)
Glucose, Bld: 99 mg/dL (ref 65–99)
Potassium: 3.7 mmol/L (ref 3.5–5.1)
Sodium: 137 mmol/L (ref 135–145)

## 2017-06-26 LAB — CBC WITH DIFFERENTIAL/PLATELET
BASOS ABS: 0 10*3/uL (ref 0.0–0.1)
BASOS PCT: 0 %
Eosinophils Absolute: 0.1 10*3/uL (ref 0.0–0.7)
Eosinophils Relative: 1 %
HEMATOCRIT: 44.8 % (ref 39.0–52.0)
HEMOGLOBIN: 15.1 g/dL (ref 13.0–17.0)
Lymphocytes Relative: 17 %
Lymphs Abs: 1.4 10*3/uL (ref 0.7–4.0)
MCH: 31.4 pg (ref 26.0–34.0)
MCHC: 33.7 g/dL (ref 30.0–36.0)
MCV: 93.1 fL (ref 78.0–100.0)
Monocytes Absolute: 0.7 10*3/uL (ref 0.1–1.0)
Monocytes Relative: 9 %
NEUTROS ABS: 5.7 10*3/uL (ref 1.7–7.7)
NEUTROS PCT: 73 %
Platelets: 178 10*3/uL (ref 150–400)
RBC: 4.81 MIL/uL (ref 4.22–5.81)
RDW: 13.1 % (ref 11.5–15.5)
WBC: 7.8 10*3/uL (ref 4.0–10.5)

## 2017-06-26 LAB — CBG MONITORING, ED: GLUCOSE-CAPILLARY: 90 mg/dL (ref 65–99)

## 2017-06-26 LAB — TROPONIN I: Troponin I: <0.03 ng/mL (ref <0.03)

## 2017-06-26 NOTE — ED Provider Notes (Signed)
Emergency Department Provider Note   I have reviewed the triage vital signs and the nursing notes.   HISTORY  Chief Complaint lightheaded   HPI Tracy Pratt is a 83 y.o. male ith a history of hypertension and prostate cancer the presents to the emergency department today with the second episode of near syncope. Patient states that both times it was after driving his vehicle to a [employer]. The first time was on the 16th of this month when he had acute onset ofclamminesssweaty and feeling"washed out". EMS was called and at that time he was hypotensive and brought here had acute kidney injury was rehydrated and returned to normal. Thought to be related to hypovolemia and a vagal-type symptom. However today he had been well-hydrated and hehad acute onset of the same symptoms. This time it only lasted a few minutes he was back to his normal without any intervention. Prior to couple weeks ago no history of the same. No history of cardiac disease. No chest pain, shortness of breath, palpitations, headache, vision change or other associated symptoms. Nothing makes it better or worse but does seem to be associated with walking into the heat.   Past Medical History:  Diagnosis Date  . Adrenal adenoma    left  . Allergy   . Arthritis    back,hip  . BPH (benign prostatic hyperplasia)   . Cataract 2007&2009   both   . Hypertension   . Malignant hyperthermia   . Prostate cancer ([place]) 08/08/2007   seed implant and external rad tx  . Spondylolisthesis    l5-s1    Patient Active Problem List   Diagnosis Date Noted  . Spondylolisthesis at L5-S1 level 12/13/2015  . Midline low back pain without sciatica 12/13/2015  . Pain in joint, shoulder region 03/23/2014  . Muscle weakness (generalized) 03/23/2014  . Decreased range of motion of left shoulder 03/23/2014  . Prostate cancer ([place]) 10/31/2011  . Spondylolisthesis   . BPH (benign prostatic hyperplasia)   . Malignant  hyperthermia   . Adrenal adenoma     Past Surgical History:  Procedure Laterality Date  . BACK SURGERY  1979  . CATARACT EXTRACTION  2007  . CORNEAL TRANSPLANT  2007  . KNEE SURGERY  1996   left knee arthoscopic  . torn meniscus left knee  1996  . TRANSURETHRAL RESECTION OF PROSTATE  1997    Current Outpatient Rx  . Order #: [id number] Class: Historical Med  . Order #: [id number] Class: Print  . Order #: [id number] Class: Historical Med  . Order #: [id number] Class: Historical Med  . Order #: [id number] Class: Normal    Allergies Diclofenac sodium; Nabumetone; Nsaids; Bee venom; Desflurane; Enflurane; Halothane; Isoflurane; Sevoflurane; and Succinylcholine  Family History  Problem Relation Age of Onset  . Prostate cancer Brother   . Lung disease Brother        lobectomy    Social History Social History  Substance Use Topics  . Smoking status: Former Smoker    Packs/day: 1.00    Years: 15.00    Types: Cigarettes  . Smokeless tobacco: Never Used     Comment: quit 28 years ago  . Alcohol use No    Review of Systems Constitutional: No fever/chills Eyes: No visual changes. ENT: No sore throat. Cardiovascular: Denies chest pain. Respiratory: Denies shortness of breath. Gastrointestinal: No abdominal pain.  No nausea, no vomiting.  No diarrhea.  No constipation. Genitourinary: Negative for dysuria. Musculoskeletal: Negative for back pain. Skin: Negative for rash.  Neurological: Negative for headaches, focal weakness or numbness.  10-point ROS otherwise negative.  ____________________________________________   PHYSICAL EXAM:  VITAL SIGNS: ED Triage Vitals  Enc Vitals Group     BP 06/26/17 1344 139/78     Pulse Rate 06/26/17 1343 (!) 57     Resp 06/26/17 1343 18     Temp 06/26/17 1343 98.2 F (36.8 C)     Temp Source 06/26/17 1343 Oral     SpO2 06/26/17 1343 98 %     Weight 06/26/17 1344 212 lb (96.2 kg)     Height 06/26/17 1344 5\' 9"  (1.753 m)     Head  Circumference --      Peak Flow --      Pain Score --      Pain Loc --      Pain Edu? --      Excl. in [place]? --     Constitutional: Alert and oriented. Well appearing and in no acute distress. Eyes: Conjunctivae are normal. PERRL. EOMI. Head: Atraumatic. Nose: No congestion/rhinnorhea. Mouth/Throat: Mucous membranes are moist.  Oropharynx non-erythematous. Neck: No stridor.  No meningeal signs.   Cardiovascular: Normal rate, regular rhythm. Good peripheral circulation. Has a 2/6 systolic heart murmur.   Respiratory: Normal respiratory effort.  No retractions. Lungs CTAB. Gastrointestinal: Soft and nontender. No distention.  Musculoskeletal: No lower extremity tenderness nor edema. No gross deformities of extremities. Neurologic:  Normal speech and language. No gross focal neurologic deficits are appreciated. No altered mental status, able to give full seemingly accurate history.  Face is symmetric, EOM's intact, pupils equal and reactive, vision intact, tongue and uvula midline without deviation. Upper and Lower extremity motor 5/5, intact pain perception in distal extremities, 2+ reflexes in biceps, patella and achilles tendons. Able to perform finger to nose normal with both hands. Walks without assistance or evident ataxia.  Skin:  Skin is warm, dry and intact. No rash noted.   ____________________________________________   LABS (all labs ordered are listed, but only abnormal results are displayed)  Labs Reviewed  BASIC METABOLIC PANEL - Abnormal; Notable for the following:       Result Value   Calcium 8.8 (*)    GFR calc non Af Amer 60 (*)    All other components within normal limits  CBC WITH DIFFERENTIAL/PLATELET  TROPONIN I  TROPONIN I  CBG MONITORING, ED   ____________________________________________  EKG   EKG Interpretation  Date/Time:  Wednesday June 26 2017 13:50:41 EDT Ventricular Rate:  56 PR Interval:  196 QRS Duration: 110 QT Interval:  440 QTC  Calculation: 424 R Axis:   -52 Text Interpretation:  Sinus bradycardia with sinus arrhythmia Incomplete right bundle branch block Left anterior fascicular block Abnormal ECG No significant change since last tracing Confirmed by Merrily Pew [phone]) on 06/26/2017 4:25:55 PM       ____________________________________________  RADIOLOGY  No results found.  ____________________________________________   PROCEDURES  Procedure(s) performed:   Procedures   ____________________________________________   INITIAL IMPRESSION / ASSESSMENT AND PLAN / ED COURSE  Pertinent labs & imaging results that were available during my care of the patient were reviewed by me and considered in my medical decision making (see chart for details).  Symptoms sound either vagal or related to possible aortic stenosis that he does have a heart murmur. Delta troponins here negative [place] of acute ACS unlikely. However discussed with Dr. Domenic Polite, Dr. Nelly Laurence partner, who recommends close cardiology follow-up for Holter monitoring and echocardiogram if patient appears  well here. As he only has symptoms intermittently I feel like hospital stay is likely not going to benefit him as he needs longer term monitoring as can be done with a Holter monitor of some sort. Cardiology office will call her with a follow-up appointment otherwise return here for new or worsening symptoms. We'll also continue following with primary doctor for further workup in case this is not cardiac related. Low suspicion for neurologic causes at this time as it would be a basilar artery issue if anything.   ____________________________________________  FINAL CLINICAL IMPRESSION(S) / ED DIAGNOSES  Final diagnoses:  Lightheaded     MEDICATIONS GIVEN DURING THIS VISIT:  Medications - No data to display   NEW OUTPATIENT MEDICATIONS STARTED DURING THIS VISIT:  New Prescriptions   No medications on file    Note:  This document was  prepared using Dragon voice recognition software and may include unintentional dictation errors.    Merrily Pew, MD 06/26/17 [id number]

## 2017-06-26 NOTE — ED Notes (Signed)
EKG given to Long MD 

## 2017-06-26 NOTE — ED Triage Notes (Signed)
Pt reports he was here 2 weeks ago for heat exhaustion.  Says hasn't felt completley well since then.  Today has been in air conditioning but says he felt a little lightheaded today.  Pt went to restaurant and was shakey, lightheaded, and clammy.  Pt did not pass out.  EMS was called out but pt came to er via POV.

## 2017-06-27 ENCOUNTER — Other Ambulatory Visit: Payer: Self-pay

## 2017-06-27 ENCOUNTER — Ambulatory Visit (HOSPITAL_COMMUNITY)
Admission: RE | Admit: 2017-06-27 | Discharge: 2017-06-27 | Disposition: A | Discharge status: Home / Self Care | Payer: Medicare Other | Source: Ambulatory Visit | Attending: Cardiology | Admitting: Cardiology

## 2017-06-27 ENCOUNTER — Ambulatory Visit (INDEPENDENT_AMBULATORY_CARE_PROVIDER_SITE_OTHER): Payer: Medicare Other

## 2017-06-27 DIAGNOSIS — R509 Fever, unspecified: Secondary | ICD-10-CM | POA: Diagnosis not present

## 2017-06-27 DIAGNOSIS — C61 Malignant neoplasm of prostate: Secondary | ICD-10-CM | POA: Insufficient documentation

## 2017-06-27 DIAGNOSIS — R55 Syncope and collapse: Secondary | ICD-10-CM | POA: Diagnosis not present

## 2017-06-27 DIAGNOSIS — R011 Cardiac murmur, unspecified: Secondary | ICD-10-CM | POA: Diagnosis not present

## 2017-06-27 DIAGNOSIS — I358 Other nonrheumatic aortic valve disorders: Secondary | ICD-10-CM | POA: Diagnosis not present

## 2017-06-27 LAB — ECHOCARDIOGRAM COMPLETE
AO mean calculated velocity dopler: 102 cm/s
AOPV: 0.71 m/s
AV Area VTI index: 1.36 cm2/m2
AV Area VTI: 2.71 cm2
AV Area mean vel: 3 cm2
AV Mean grad: 6 mmHg
AV Peak grad: 14 mmHg
AV VEL mean LVOT/AV: 0.79
AV peak Index: 1.24
AVAREAMEANVIN: 1.37 cm2/m2
AVPKVEL: 189 cm/s
CHL CUP AV VEL: 2.99
CHL CUP MV DEC (S): 275
CHL CUP RV SYS PRESS: 25 mmHg
E decel time: 275 msec
E/e' ratio: 6.72
FS: 36 % (ref 28–44)
IVS/LV PW RATIO, ED: 1.05
LA vol A4C: 48.7 ml
LA vol index: 20.9 mL/m2
LADIAMINDEX: 1.6 cm/m2
LASIZE: 35 mm
LAVOL: 45.9 mL
LDCA: 3.8 cm2
LEFT ATRIUM END SYS DIAM: 35 mm
LV E/e' medial: 6.72
LV E/e'average: 6.72
LV TDI E'MEDIAL: 7.07
LV dias vol index: 37 mL/m2
LV dias vol: 82 mL (ref 62–150)
LVELAT: 9.9 cm/s
LVOT SV: 108 mL
LVOT VTI: 28.5 cm
LVOT peak grad rest: 7 mmHg
LVOT peak vel: 135 cm/s
LVOTD: 22 mm
LVOTVTI: 0.79 cm
LVSYSVOL: 22 mL (ref 21–61)
LVSYSVOLIN: 10 mL/m2
Lateral S' vel: 14.5 cm/s
MV pk A vel: 84 m/s
MVPKEVEL: 66.5 m/s
PW: 9.8 mm — AB (ref 0.6–1.1)
Reg peak vel: 235 cm/s
Simpson's disk: 74
Stroke v: 60 ml
TAPSE: 27.2 mm
TDI e' lateral: 9.9
TRMAXVEL: 235 cm/s
VTI: 36.2 cm
Valve area index: 1.36
Valve area: 2.99 cm2

## 2017-06-27 NOTE — Progress Notes (Signed)
*  PRELIMINARY RESULTS* Echocardiogram 2D Echocardiogram has been performed.  Tracy Pratt 06/27/2017, 3:23 PM

## 2017-06-27 NOTE — Progress Notes (Signed)
14 day event monitor placed for syncope per order by Dr Domenic Polite

## 2017-06-27 NOTE — Progress Notes (Unsigned)
Tracy Pratt  [name], Marikay Alar, LPN; Drema Dallas, CMA        Can someone put an order in for echo for this patient to be done today.   DX: syncope / murmur

## 2017-06-28 ENCOUNTER — Ambulatory Visit (HOSPITAL_COMMUNITY)
Admission: RE | Admit: 2017-06-28 | Discharge: 2017-06-28 | Disposition: A | Discharge status: Home / Self Care | Payer: Medicare Other | Source: Ambulatory Visit | Attending: Orthopaedic Surgery | Admitting: Orthopaedic Surgery

## 2017-06-28 DIAGNOSIS — G8929 Other chronic pain: Secondary | ICD-10-CM

## 2017-06-28 DIAGNOSIS — M48061 Spinal stenosis, lumbar region without neurogenic claudication: Secondary | ICD-10-CM | POA: Diagnosis not present

## 2017-06-28 DIAGNOSIS — M5441 Lumbago with sciatica, right side: Secondary | ICD-10-CM | POA: Insufficient documentation

## 2017-06-28 DIAGNOSIS — M545 Low back pain: Secondary | ICD-10-CM | POA: Diagnosis not present

## 2017-07-02 ENCOUNTER — Ambulatory Visit (INDEPENDENT_AMBULATORY_CARE_PROVIDER_SITE_OTHER): Payer: Medicare Other | Admitting: Orthopaedic Surgery

## 2017-07-02 VITALS — BP 135/85 | HR 73 | Temp 97.0°F | Ht 69.0 in | Wt 207.0 lb

## 2017-07-02 DIAGNOSIS — R61 Generalized hyperhidrosis: Secondary | ICD-10-CM | POA: Diagnosis not present

## 2017-07-02 DIAGNOSIS — M48061 Spinal stenosis, lumbar region without neurogenic claudication: Secondary | ICD-10-CM | POA: Diagnosis not present

## 2017-07-02 DIAGNOSIS — Z6841 Body Mass Index (BMI) 40.0 and over, adult: Secondary | ICD-10-CM | POA: Diagnosis not present

## 2017-07-02 DIAGNOSIS — I6529 Occlusion and stenosis of unspecified carotid artery: Secondary | ICD-10-CM | POA: Diagnosis not present

## 2017-07-02 DIAGNOSIS — R231 Pallor: Secondary | ICD-10-CM | POA: Diagnosis not present

## 2017-07-02 DIAGNOSIS — R42 Dizziness and giddiness: Secondary | ICD-10-CM | POA: Diagnosis not present

## 2017-07-02 DIAGNOSIS — M5441 Lumbago with sciatica, right side: Secondary | ICD-10-CM | POA: Diagnosis not present

## 2017-07-02 DIAGNOSIS — G8929 Other chronic pain: Secondary | ICD-10-CM | POA: Diagnosis not present

## 2017-07-02 DIAGNOSIS — M4317 Spondylolisthesis, lumbosacral region: Secondary | ICD-10-CM | POA: Diagnosis not present

## 2017-07-02 DIAGNOSIS — R7309 Other abnormal glucose: Secondary | ICD-10-CM | POA: Diagnosis not present

## 2017-07-02 LAB — HEMOGLOBIN A1C: HEMOGLOBIN A1C: 5.8

## 2017-07-02 NOTE — Progress Notes (Signed)
Patient Tracy Pratt, male DOB:04/27/1931, 83 y.o. GMW:[id number]  Chief Complaint  Patient presents with  . Results    MRI Lumbar Spine    HPI  Tracy Pratt is a 83 y.o. male who has lower back pain with more right sided pain.  He has some left sided pain but the right side is the worse.  He had MRI done and it showed: IMPRESSION: 1. Moderate spinal stenosis at L4-5 primarily due to hypertrophy of the ligamentum flavum. 2. Mild spinal stenosis at L3-4. 3. Severe left foraminal stenosis at L5-S1.  I have explained the findings to him.  I will have him seen at [facility] neurosurgery for further evaluation.  He has history of malignant hyperthermia and they have managed this for him with any surgical procedure at [facility]. HPI  Body mass index is 30.57 kg/m.  ROS  Review of Systems  Constitutional:       Patient does not have Diabetes Mellitus. Patient has hypertension. Patient does not have COPD or shortness of breath. Patient does not have BMI > 35. Patient does not have current smoking history.  HENT: Negative for congestion.   Eyes:       Post cataract surgery, doing well   Respiratory: Negative for cough and shortness of breath.   Cardiovascular: Negative for chest pain and leg swelling.       Malignant hyperthermia with anesthesia  Endocrine: Negative for cold intolerance.  Genitourinary:       Post prostate cancer with treatment, doing well.  Musculoskeletal: Positive for arthralgias (prior of knees and shoulders, resolved for now, recurrent) and back pain.  Allergic/Immunologic: Negative for environmental allergies.    Past Medical History:  Diagnosis Date  . Adrenal adenoma    left  . Allergy   . Arthritis    back,hip  . BPH (benign prostatic hyperplasia)   . Cataract 2007&2009   both   . Hypertension   . Malignant hyperthermia   . Prostate cancer ([place]) 08/08/2007   seed implant and external rad tx  . Spondylolisthesis    l5-s1     Past Surgical History:  Procedure Laterality Date  . BACK SURGERY  1979  . CATARACT EXTRACTION  2007  . CORNEAL TRANSPLANT  2007  . KNEE SURGERY  1996   left knee arthoscopic  . torn meniscus left knee  1996  . TRANSURETHRAL RESECTION OF PROSTATE  1997    Family History  Problem Relation Age of Onset  . Prostate cancer Brother   . Lung disease Brother        lobectomy    Social History Social History  Substance Use Topics  . Smoking status: Former Smoker    Packs/day: 1.00    Years: 15.00    Types: Cigarettes  . Smokeless tobacco: Never Used     Comment: quit 28 years ago  . Alcohol use No    Allergies  Allergen Reactions  . Diclofenac Sodium Anaphylaxis  . Nabumetone Anaphylaxis  . Nsaids Anaphylaxis  . Bee Venom Swelling  . Desflurane     Malignant hyperthermia  . Enflurane     Malignant hyperthermia  . Halothane     Malignant hyperthermia  . Isoflurane     Malignant hyperthermia  . Sevoflurane     Malignant hyperthermia  . Succinylcholine     Malignant hyperthermia    Current Outpatient Prescriptions  Medication Sig Dispense Refill  . furosemide (LASIX) 40 MG tablet Take 40 mg  by mouth daily as needed for fluid.     Marland Kitchen HYDROcodone-acetaminophen (NORCO) 7.5-325 MG tablet Take 1 tablet by mouth every 4 (four) hours as needed for moderate pain (Must last 30 days.  Do not drive or operate machinery while taking this medicine.). 120 tablet 0  . lisinopril (PRINIVIL,ZESTRIL) 10 MG tablet Take 10 mg by mouth daily with breakfast.    . loteprednol (LOTEMAX) 0.5 % ophthalmic suspension Place 1 drop into both eyes at bedtime.     . methocarbamol (ROBAXIN) 500 MG tablet TAKE (1) TABLET BY MOUTH (3) TIMES DAILY. 60 tablet 1   No current facility-administered medications for this visit.      Physical Exam  Blood pressure 135/85, pulse 73, temperature (!) 97 F (36.1 C), height 5\' 9"  (1.753 m), weight 207 lb (93.9 kg).  Constitutional: overall normal  hygiene, normal nutrition, well developed, normal grooming, normal body habitus. Assistive device:none  Musculoskeletal: gait and station Limp none, muscle tone and strength are normal, no tremors or atrophy is present.  .  Neurological: coordination overall normal.  Deep tendon reflex/nerve stretch intact.  Sensation normal.  Cranial nerves II-XII intact.   Skin:   Normal overall no scars, lesions, ulcers or rashes. No psoriasis.  Psychiatric: Alert and oriented x 3.  Recent memory intact, remote memory unclear.  Normal mood and affect. Well groomed.  Good eye contact.  Cardiovascular: overall no swelling, no varicosities, no edema bilaterally, normal temperatures of the legs and arms, no clubbing, cyanosis and good capillary refill.  Lymphatic: palpation is normal.  He has pain of the lower back with right sided pain and weakly positive right SLR. Reflexes are good.  Gait is good.  He has no spasm.  The patient has been educated about the nature of the problem(s) and counseled on treatment options.  The patient appeared to understand what I have discussed and is in agreement with it.  Encounter Diagnoses  Name Primary?  . Chronic right-sided low back pain with right-sided sciatica Yes  . Spondylolisthesis at L5-S1 level   . Spinal stenosis of lumbar region without neurogenic claudication     PLAN Call if any problems.  Precautions discussed.  Continue current medications.   Return to clinic to [facility] neurosrugery.   Electronically Signed Sanjuana Kava, MD 9/4/20189:46 AM

## 2017-07-04 DIAGNOSIS — E782 Mixed hyperlipidemia: Secondary | ICD-10-CM | POA: Diagnosis not present

## 2017-07-04 DIAGNOSIS — R600 Localized edema: Secondary | ICD-10-CM | POA: Diagnosis not present

## 2017-07-04 DIAGNOSIS — R42 Dizziness and giddiness: Secondary | ICD-10-CM | POA: Diagnosis not present

## 2017-07-04 DIAGNOSIS — R61 Generalized hyperhidrosis: Secondary | ICD-10-CM | POA: Diagnosis not present

## 2017-07-04 DIAGNOSIS — R231 Pallor: Secondary | ICD-10-CM | POA: Diagnosis not present

## 2017-07-04 DIAGNOSIS — Z6830 Body mass index (BMI) 30.0-30.9, adult: Secondary | ICD-10-CM | POA: Diagnosis not present

## 2017-07-04 DIAGNOSIS — R7309 Other abnormal glucose: Secondary | ICD-10-CM | POA: Diagnosis not present

## 2017-07-04 DIAGNOSIS — R161 Splenomegaly, not elsewhere classified: Secondary | ICD-10-CM | POA: Diagnosis not present

## 2017-07-04 DIAGNOSIS — E559 Vitamin D deficiency, unspecified: Secondary | ICD-10-CM | POA: Diagnosis not present

## 2017-07-04 DIAGNOSIS — C61 Malignant neoplasm of prostate: Secondary | ICD-10-CM | POA: Diagnosis not present

## 2017-07-04 DIAGNOSIS — R946 Abnormal results of thyroid function studies: Secondary | ICD-10-CM | POA: Diagnosis not present

## 2017-07-04 DIAGNOSIS — I10 Essential (primary) hypertension: Secondary | ICD-10-CM | POA: Diagnosis not present

## 2017-07-04 LAB — TSH: TSH: 4.7 (ref <=5.90)

## 2017-07-05 ENCOUNTER — Telehealth: Payer: Self-pay | Admitting: Orthopaedic Surgery

## 2017-07-07 ENCOUNTER — Encounter: Payer: Self-pay | Admitting: Cardiology

## 2017-07-08 ENCOUNTER — Telehealth: Payer: Self-pay | Admitting: Orthopaedic Surgery

## 2017-07-11 DIAGNOSIS — R7309 Other abnormal glucose: Secondary | ICD-10-CM | POA: Diagnosis not present

## 2017-07-15 ENCOUNTER — Encounter: Payer: Self-pay | Admitting: Cardiology

## 2017-07-15 NOTE — Progress Notes (Signed)
Cardiology Office Note  Date: 07/16/2017   ID: Colbert, [place] 13-Feb-1931, MRN [id number]  PCP: [name], MD  Consulting Cardiologist: Rozann Lesches, MD   Chief Complaint  Patient presents with  . Near Syncope    History of Present Illness: Tracy Pratt is an 83 y.o. male referred for cardiology consultation by Dr. Dayna Barker for the evaluation of near-syncope. He was most recently seen in the ER on August 29. Following that visit he was scheduled to undergo an echocardiogram as well as a cardiac monitor. He is here today with his daughter. He has had intermittent episodes where he feels "crazy" in his head, generally weak, also looks to be diaphoretic, but does not have frank syncope. He has had no specific palpitations or chest pain with these episodes. This has occurred after events such as working in his yard in the heat, after an episode of abdominal cramping, one episode occurred in the early morning hours that woke him up, and another occurred when he had been seated for over an hour in the car. Interestingly, he has always felt as if he needs to eat something when this happens, when he has had candy or other source of glucose he feels better fairly quickly.  Echocardiogram revealed LVEF 65-70% with mild diastolic dysfunction and no major valvular abnormalities. I went over the results with him today. He did wear a cardiac monitor, targeting last week, results are still pending for my review. While wearing the monitor he did have 2 episodes, one occurred on September 1 around 12:30 in the afternoon, and another occurred on September 2 around 5:45 in the morning.  He has also been wearing a glucose monitor per PCP to assess for potentially associated hypoglycemia which would require further endocrine workup. He is not on glucose lowering agents.  He remains fairly active. 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 With activity he does not report any exertional  chest discomfort or limiting shortness of breath.  Past Medical History:  Diagnosis Date  . Adrenal adenoma    Left  . Arthritis   . BPH (benign prostatic hyperplasia)   . Cataract 2007, 2009   Bilateral  . Essential hypertension   . Malignant hyperthermia   . Prostate cancer ([place]) 08/08/2007   Seed Implant and XRT  . Spondylolisthesis    L5-S1    Past Surgical History:  Procedure Laterality Date  . APPENDECTOMY  1940  . BACK SURGERY  1979  . CATARACT EXTRACTION  2007  . CORNEAL TRANSPLANT  2007  . KNEE SURGERY  1996   left knee arthoscopic  . ROTATOR CUFF REPAIR  2015  . Torn meniscus left knee  1996  . TRANSURETHRAL RESECTION OF PROSTATE  1997    Current Outpatient Prescriptions  Medication Sig Dispense Refill  . furosemide (LASIX) 40 MG tablet Take 40 mg by mouth daily as needed for fluid.     Marland Kitchen HYDROcodone-acetaminophen (NORCO) 7.5-325 MG tablet TAKE ONE TABLET BY MOUTH EVERY 4 HOURS AS NEEDED FOR MODERATE PAIN. MUST LAST 30 DAYS. 120 tablet 0  . lisinopril-hydrochlorothiazide (PRINZIDE,ZESTORETIC) 10-12.5 MG tablet Take 1 tablet by mouth daily.    Marland Kitchen loteprednol (LOTEMAX) 0.5 % ophthalmic suspension Place 1 drop into both eyes at bedtime.     . Vitamin D, Ergocalciferol, (DRISDOL) 50000 units CAPS capsule Take 50,000 Units by mouth every 7 (seven) days.    . methocarbamol (ROBAXIN) 500 MG tablet TAKE (1) TABLET  BY MOUTH (3) TIMES DAILY. (Patient not taking: Reported on 07/16/2017) 60 tablet 0   No current facility-administered medications for this visit.    Allergies:  Diclofenac sodium; Nabumetone; Nsaids; Bee venom; Desflurane; Enflurane; Halothane; Isoflurane; Sevoflurane; and Succinylcholine   Social History: The patient  reports that he has quit smoking. His smoking use included Cigarettes. He has a 15.00 pack-year smoking history. He has never used smokeless tobacco. He reports that he does not drink alcohol or use drugs.   Family History: The patient's family  history includes Lung disease in his brother; Prostate cancer in his brother.   ROS:  Please see the history of present illness. Otherwise, complete review of systems is positive for back and leg pain with recently diagnosed spinal stenosis.  All other systems are reviewed and negative.   Physical Exam: VS:  BP 136/68   Pulse 72   Ht 5\' 9"  (1.753 m)   Wt 208 lb (94.3 kg)   SpO2 96%   BMI 30.72 kg/m , BMI Body mass index is 30.72 kg/m.  Wt Readings from Last 3 Encounters:  07/16/17 208 lb (94.3 kg)  07/02/17 207 lb (93.9 kg)  06/26/17 212 lb (96.2 kg)    General: Overweight elderly male, appears comfortable at rest. HEENT: Conjunctiva and lids normal, oropharynx clear. Neck: Supple, no elevated JVP or carotid bruits, no thyromegaly. Lungs: Clear to auscultation, nonlabored breathing at rest. Cardiac: Regular rate and rhythm, no S3, soft systolic murmur, no pericardial rub. Abdomen: Soft, nontender, bowel sounds present, no guarding or rebound. Extremities: Varicose veins in the legs. No pitting edema, distal pulses 2+. Skin: Warm and dry. Musculoskeletal: No kyphosis. Neuropsychiatric: Alert and oriented x3, affect grossly appropriate.  ECG: I personally reviewed the tracing from 06/26/2017 which showed sinus rhythm with right bundle branch block and left anterior fascicular block.  Recent Labwork: 06/13/2017: ALT 24; AST 23 06/26/2017: BUN 12; Creatinine, Ser 1.10; Hemoglobin 15.1; Platelets 178; Potassium 3.7; Sodium 137   Other Studies Reviewed Today:  Echocardiogram 06/27/2017: Study Conclusions  - Left ventricle: The cavity size was normal. Wall thickness was at   the upper limits of normal. Systolic function was vigorous. The   estimated ejection fraction was in the range of 65% to 70%. Wall   motion was normal; there were no regional wall motion   abnormalities. Doppler parameters are consistent with abnormal   left ventricular relaxation (grade 1 diastolic  dysfunction). - Aortic valve: Mildly calcified annulus. Trileaflet. There was no   stenosis. Mean gradient (S): 6 mm Hg. Peak gradient (S): 14 mm   Hg. Valve area (VTI): 2.99 cm^2. - Mitral valve: There was trivial regurgitation. - Right atrium: Central venous pressure (est): 3 mm Hg. - Atrial septum: No defect or patent foramen ovale was identified. - Tricuspid valve: There was trivial regurgitation. - Pulmonary arteries: PA peak pressure: 25 mm Hg (S). - Pericardium, extracardiac: There was no pericardial effusion.  Impressions:  - Upper normal LV wall thickness with LVEF 65-70% and grade 1   diastolic dysfunction. Trivial mitral regurgitation. Mildly   sclerotic aortic valve without stenosis. Trivial tricuspid   regurgitation with normal estimated PASP 25 mmHg.  Assessment and Plan:  1. Intermittent spells as outlined above, no frank syncope or palpitations. Reportedly, patient had PVCs documented during ER encounter subsequent to one of these events, but was not symptomatic at the time. LVEF is normal by echocardiogram and he has no major valvular abnormalities that would be expected to cause symptomatology.  He does not specifically endorse palpitations. There may be some correlation to relative hypoglycemia with workup ongoing per PCP. He did wear a cardiac monitor, we are in the process of retrieving the results for review, mainly to exclude any associated bradyarrhythmias. His baseline ECG does suggest conduction system disease.  2. Essential hypertension, on Prinzide. Blood pressure control is adequate today.  3. History of heart murmur. Mild aortic valve sclerosis noted by recent echocardiogram, but no stenotic valvular abnormality.  Current medicines were reviewed with the patient today.   Disposition: Call with monitor results.  Signed, Satira Sark, MD, [facility] 07/16/2017 1:34 PM    [facility] [address], [place], [place]  [address] Phone: [phone]; Fax: [phone]

## 2017-07-16 ENCOUNTER — Ambulatory Visit (INDEPENDENT_AMBULATORY_CARE_PROVIDER_SITE_OTHER): Payer: Medicare Other | Admitting: Cardiology

## 2017-07-16 ENCOUNTER — Encounter: Payer: Self-pay | Admitting: Cardiology

## 2017-07-16 VITALS — BP 136/68 | HR 72 | Ht 69.0 in | Wt 208.0 lb

## 2017-07-16 DIAGNOSIS — I358 Other nonrheumatic aortic valve disorders: Secondary | ICD-10-CM

## 2017-07-16 DIAGNOSIS — R55 Syncope and collapse: Secondary | ICD-10-CM | POA: Diagnosis not present

## 2017-07-16 DIAGNOSIS — I6529 Occlusion and stenosis of unspecified carotid artery: Secondary | ICD-10-CM | POA: Diagnosis not present

## 2017-07-16 DIAGNOSIS — I10 Essential (primary) hypertension: Secondary | ICD-10-CM | POA: Diagnosis not present

## 2017-07-16 NOTE — Patient Instructions (Signed)
We will call you with monitor results    Your physician recommends that you continue on your current medications as directed. Please refer to the Current Medication list given to you today.       Thank you for choosing [facility] !

## 2017-07-22 DIAGNOSIS — E162 Hypoglycemia, unspecified: Secondary | ICD-10-CM | POA: Diagnosis not present

## 2017-07-22 DIAGNOSIS — R946 Abnormal results of thyroid function studies: Secondary | ICD-10-CM | POA: Diagnosis not present

## 2017-07-22 DIAGNOSIS — E6609 Other obesity due to excess calories: Secondary | ICD-10-CM | POA: Diagnosis not present

## 2017-07-22 DIAGNOSIS — Z1389 Encounter for screening for other disorder: Secondary | ICD-10-CM | POA: Diagnosis not present

## 2017-07-22 DIAGNOSIS — I10 Essential (primary) hypertension: Secondary | ICD-10-CM | POA: Diagnosis not present

## 2017-07-22 DIAGNOSIS — Z6830 Body mass index (BMI) 30.0-30.9, adult: Secondary | ICD-10-CM | POA: Diagnosis not present

## 2017-07-23 DIAGNOSIS — M4727 Other spondylosis with radiculopathy, lumbosacral region: Secondary | ICD-10-CM | POA: Diagnosis not present

## 2017-07-23 DIAGNOSIS — M5117 Intervertebral disc disorders with radiculopathy, lumbosacral region: Secondary | ICD-10-CM | POA: Diagnosis not present

## 2017-07-29 DIAGNOSIS — G5701 Lesion of sciatic nerve, right lower limb: Secondary | ICD-10-CM | POA: Insufficient documentation

## 2017-07-29 DIAGNOSIS — Z23 Encounter for immunization: Secondary | ICD-10-CM | POA: Diagnosis not present

## 2017-07-31 ENCOUNTER — Encounter: Payer: Self-pay | Admitting: "Endocrinology

## 2017-08-02 ENCOUNTER — Telehealth (HOSPITAL_COMMUNITY): Payer: Self-pay | Admitting: Physical Therapy

## 2017-08-02 NOTE — Telephone Encounter (Signed)
Tracy Pratt from [facility] called to ask what date Tracy Pratt will be seen. Pt will be seen 08/07/17.

## 2017-08-07 ENCOUNTER — Encounter (HOSPITAL_COMMUNITY): Payer: Self-pay | Admitting: Physical Therapy

## 2017-08-07 ENCOUNTER — Ambulatory Visit (HOSPITAL_COMMUNITY): Payer: Medicare Other | Attending: Physician Assistant | Admitting: Physical Therapy

## 2017-08-07 DIAGNOSIS — R29898 Other symptoms and signs involving the musculoskeletal system: Secondary | ICD-10-CM | POA: Diagnosis not present

## 2017-08-07 DIAGNOSIS — R293 Abnormal posture: Secondary | ICD-10-CM | POA: Diagnosis not present

## 2017-08-07 DIAGNOSIS — G8929 Other chronic pain: Secondary | ICD-10-CM | POA: Insufficient documentation

## 2017-08-07 DIAGNOSIS — M5441 Lumbago with sciatica, right side: Secondary | ICD-10-CM | POA: Diagnosis not present

## 2017-08-07 DIAGNOSIS — M5442 Lumbago with sciatica, left side: Secondary | ICD-10-CM | POA: Insufficient documentation

## 2017-08-07 NOTE — Therapy (Signed)
[place], [place], [address] Phone: [phone]   Fax:  [phone]  Physical Therapy Evaluation  Patient Details  Name: Tracy Pratt MRN: [id number] Date of Birth: Aug 19, 1931 Referring Provider: Martie Round   Encounter Date: 08/07/2017      PT End of Session - 08/07/17 1040    Visit Number 1   Number of Visits 13   Date for PT Re-Evaluation 08/28/17   Authorization Type Medicare/Aetna Senior Supplement    Authorization Time Period 08/07/17 to 09/18/17   Authorization - Visit Number 1   Authorization - Number of Visits 10   PT Start Time 0900   PT Stop Time 0945   PT Time Calculation (min) 45 min   Activity Tolerance Patient tolerated treatment well   Behavior During Therapy [facility] for tasks assessed/performed      Past Medical History:  Diagnosis Date  . Adrenal adenoma    Left  . Arthritis   . BPH (benign prostatic hyperplasia)   . Cataract 2007, 2009   Bilateral  . Essential hypertension   . Malignant hyperthermia   . Prostate cancer ([place]) 08/08/2007   Seed Implant and XRT  . Spondylolisthesis    L5-S1    Past Surgical History:  Procedure Laterality Date  . APPENDECTOMY  1940  . BACK SURGERY  1979  . CATARACT EXTRACTION  2007  . CORNEAL TRANSPLANT  2007  . KNEE SURGERY  1996   left knee arthoscopic  . ROTATOR CUFF REPAIR  2015  . Torn meniscus left knee  1996  . TRANSURETHRAL RESECTION OF PROSTATE  1997    There were no vitals filed for this visit.       Subjective Assessment - 08/07/17 0903    Subjective Patient reports that his back has been bothering him for years, his R hip started hurting about 4 months ago. 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  He had back surgery almost 40 years ago for a pinched nerve. His pain historically has been intermittent but recently his pain has been more constant. He  always has a dull feeling in his back. No falls or close calls.    How long can you sit comfortably? can have some numbness in his hamstrings, otherwise unlimited    How long can you stand comfortably? only at its worst, has to bend over, unlimited    How long can you walk comfortably? unlimited    Patient Stated Goals feel better    Currently in Pain? No/denies  at worst 3/10             [facility] PT Assessment - 08/07/17 0001      Assessment   Medical Diagnosis LBP with scitica    Referring Provider Elta Guadeloupe Pane    Onset Date/Surgical Date --  chronic LBP, R hip pain about 4 months ago    Next MD Visit referring provider in 3 months   Prior Therapy over 10 years ago      Precautions   Precautions None     Restrictions   Weight Bearing Restrictions No     Balance Screen   Has the patient fallen in the past 6 months No   Has the patient had a decrease in activity level because of a fear of falling?  No   Is the patient reluctant to leave their home because of  a fear of falling?  No     Prior Function   Level of Independence Independent;Independent with basic ADLs;Independent with gait;Independent with transfers   Vocation Retired   Leisure helps with [employer], helps with grand kids      Observation/Other Assessments   Observations scour, FABER, SLR tests all negaive B      Posture/Postural Control   Posture/Postural Control Postural limitations   Postural Limitations Rounded Shoulders;Forward head     AROM   Lumbar Flexion full ROM; RFIS no change    Lumbar Extension moderate limitation; REIS no change    Lumbar - Right Side Bend WFL    Lumbar - Left Side Bend [facility]      Strength   Right Hip Flexion 4+/5   Right Hip Extension 4+/5   Right Hip ABduction 5/5   Left Hip Flexion 4+/5   Left Hip Extension 4+/5   Left Hip ABduction 5/5   Right Knee Flexion 4+/5   Right Knee Extension 4+/5   Left Knee Flexion 4+/5   Left Knee Extension 4+/5   Right Ankle Dorsiflexion  5/5   Left Ankle Dorsiflexion 5/5     Flexibility   Hamstrings moderate limitation B    Piriformis moderate limitation L, mild limitation R             Objective measurements completed on examination: See above findings.          [facility] Adult PT Treatment/Exercise - 08/07/17 0001      Exercises   Exercises Lumbar     Lumbar Exercises: Stretches   Active Hamstring Stretch 1 rep;30 seconds   Active Hamstring Stretch Limitations seated, B    Single Knee to Chest Stretch 2 reps;10 seconds   Single Knee to Chest Stretch Limitations B   Lower Trunk Rotation 10 seconds;3 reps   Lower Trunk Rotation Limitations B   Piriformis Stretch 1 rep;30 seconds   Piriformis Stretch Limitations B                PT Education - 08/07/17 1040    Education provided Yes   Education Details prognosis, exam findings, POC, HEP    Person(s) Educated Patient   Methods Explanation;Demonstration;Handout   Comprehension Verbalized understanding;Returned demonstration;Need further instruction          PT Short Term Goals - 08/07/17 1048      PT SHORT TERM GOAL #1   Title Patient to demonstrate bilateral hamstrings and piriformis muscle groups as being no more than 20% limited in order to improve mechanics during functional task performance    Time 3   Period Weeks   Status New   Target Date 08/28/17     PT SHORT TERM GOAL #2   Title Patient to be compliant with correct performance of appropriate HEP, to be updated PRN    Time 1   Period Weeks   Status New   Target Date 08/14/17           PT Long Term Goals - 08/07/17 1049      PT LONG TERM GOAL #1   Title Patient to demonstrate MMT as being 5/5 in all tested groups in order to assist in improving functional lifting and mechanics    Time 6   Period Weeks   Status New   Target Date 09/18/17     PT LONG TERM GOAL #2   Title Patient to experience low back and R hip pain as being no more than  1/10 with repetitive  functional lifting with 20# in order to show improved functional status    Time 6   Period Weeks   Status New     PT LONG TERM GOAL #3   Title Patient to be able to perform 30 repetitions of functional floor to shoulder height lifting with load of 20# and correct functional mechanics, low back and R hip pain being no more than 1/10, in order to show improved functional performance    Time 6   Period Weeks   Status New                Plan - 08/07/17 1042    Clinical Impression Statement Patient arrives with complaints of chronic LBP as well as recent onset of R hip pain as well; 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 Examination reveals impaired posture, lumbar and bilateral hip stiffness, impaired muscle flexibility, very mild functional weakness, and reduced tolerance to PLOF based tasks. Recommend skilled PT services to address functional deficits, reduce pain, and assist in return to optimal level of function.    History and Personal Factors relevant to plan of care: repetitive weighted lifting, chronicity of back pain, history of back surgery    Clinical Presentation Stable   Clinical Presentation due to: poor mechanics, repetitive motions regularly, biomechanical imbalance    Clinical Decision Making Low   Rehab Potential Good   Clinical Impairments Affecting Rehab Potential (+) motivated, active lifestyle; (-) regular repetitive lifting, history back surgery, chronicity of pain    PT Frequency 2x / week   PT Duration 6 weeks   PT Treatment/Interventions ADLs/Self Care Home Management;Cryotherapy;Moist Heat;DME Instruction;Gait training;Stair training;Functional mobility training;Therapeutic activities;Therapeutic exercise;Balance training;Neuromuscular re-education;Patient/family education;Manual techniques;Passive range of motion;Dry needling;Energy conservation;Taping   PT Next Visit Plan review initial eval/goals,  HEP; lumbar and hip mobility, functional lifting mechanics, postural training    PT Home Exercise Plan Eval: SKTC, lumbar rotations, HS stretch, piriformis stretch, lumbar towel roll    Consulted and Agree with Plan of Care Patient      Patient will benefit from skilled therapeutic intervention in order to improve the following deficits and impairments:  Improper body mechanics, Pain, Decreased mobility, Postural dysfunction, Decreased range of motion, Decreased strength, Hypomobility, Impaired flexibility  Visit Diagnosis: Chronic bilateral low back pain with bilateral sciatica - Plan: PT plan of care cert/re-cert  Abnormal posture - Plan: PT plan of care cert/re-cert  Other symptoms and signs involving the musculoskeletal system - Plan: PT plan of care cert/re-cert      G-Codes - 95/28/41 1057    Functional Assessment Tool Used (Outpatient Only) Based on skilled clinical assessment of posture, ROM, biomechanics, flexibility    Functional Limitation Mobility: Walking and moving around   Mobility: Walking and Moving Around Current Status (L2440) At least 20 percent but less than 40 percent impaired, limited or restricted   Mobility: Walking and Moving Around Goal Status (N0272) At least 1 percent but less than 20 percent impaired, limited or restricted       Problem List Patient Active Problem List   Diagnosis Date Noted  . Spondylolisthesis at L5-S1 level 12/13/2015  . Midline low back pain without sciatica 12/13/2015  . Pain in joint, shoulder region 03/23/2014  . Muscle weakness (generalized) 03/23/2014  . Decreased range of motion of left shoulder 03/23/2014  . Prostate cancer ([place]) 10/31/2011  . Spondylolisthesis   . BPH (benign prostatic hyperplasia)   .  Malignant hyperthermia   . Adrenal adenoma     Deniece Ree PT, DPT [facility] [address] [place], [place], [address] Phone: [phone]   Fax:   [phone]  Name: Laurier Jasperson MRN: [id number] Date of Birth: [birth year]

## 2017-08-07 NOTE — Patient Instructions (Signed)
   SINGLE KNEE TO CHEST STRETCH - SKTC  While lying on your back, use your hands and gently draw up a knee towards your chest.   Keep your other knee straight and lying on the ground.  Hold for at least 10 times, repeat 5 times each leg, 1-2 times per day.    Lumbar Rotations   Lying on your back with your knees bent, slowly drop your legs to one side and hold the stretch. Come back to the middle and switch sides. You should feel the stretch in your back on the opposite side that your legs are leaning.  Hold for at least 10 seconds, repeat 5 times each side, 1-2 times a day.     Seated Piriformis Stretch  1. While sitting on a chair with your back unsupported, cross the left leg over the right and hinge slightly at the waist.  2. Use your left hand to push down onto the left knee until you feel a deep stretch. This should not be painful.  3. Hold for at least 30 seconds, then relax.  4. Repeat 3 times each leg, 1-2 times per day.     SEATED HAMSTRING STRETCH  While seated, rest your heel on the floor with your knee straight and gently lean forward until a stretch is felt behind your knee/thigh.  Hold for at least 30 seconds, then switch legs.   Repeat 3 times each leg, 1-2 times per day.      Posture Correction with Lumbar Roll  Sit in firm chair with hips as far back as possible and feet flat on floor. Place roll or rolled towel behind lower back and sit back so back is supported by roll.

## 2017-08-12 ENCOUNTER — Ambulatory Visit (HOSPITAL_COMMUNITY): Payer: Medicare Other | Admitting: Physical Therapy

## 2017-08-12 DIAGNOSIS — R293 Abnormal posture: Secondary | ICD-10-CM

## 2017-08-12 DIAGNOSIS — M5442 Lumbago with sciatica, left side: Principal | ICD-10-CM

## 2017-08-12 DIAGNOSIS — G8929 Other chronic pain: Secondary | ICD-10-CM | POA: Diagnosis not present

## 2017-08-12 DIAGNOSIS — M5441 Lumbago with sciatica, right side: Secondary | ICD-10-CM | POA: Diagnosis not present

## 2017-08-12 DIAGNOSIS — R29898 Other symptoms and signs involving the musculoskeletal system: Secondary | ICD-10-CM | POA: Diagnosis not present

## 2017-08-12 NOTE — Therapy (Signed)
[place], [place], [address] Phone: [phone]   Fax:  [phone]  Physical Therapy Treatment  Patient Details  Name: Tracy Pratt MRN: [id number] Date of Birth: Oct 17, 1931 Referring Provider: Martie Round   Encounter Date: 08/12/2017      PT End of Session - 08/12/17 1420    Visit Number 2   Number of Visits 13   Date for PT Re-Evaluation 08/28/17   Authorization Type Medicare/Aetna Senior Supplement    Authorization Time Period 08/07/17 to 09/18/17   Authorization - Visit Number 2   Authorization - Number of Visits 10   PT Start Time 4580   PT Stop Time 1430   PT Time Calculation (min) 45 min   Activity Tolerance Patient tolerated treatment well   Behavior During Therapy [facility] for tasks assessed/performed      Past Medical History:  Diagnosis Date  . Adrenal adenoma    Left  . Arthritis   . BPH (benign prostatic hyperplasia)   . Cataract 2007, 2009   Bilateral  . Essential hypertension   . Malignant hyperthermia   . Prostate cancer ([place]) 08/08/2007   Seed Implant and XRT  . Spondylolisthesis    L5-S1    Past Surgical History:  Procedure Laterality Date  . APPENDECTOMY  1940  . BACK SURGERY  1979  . CATARACT EXTRACTION  2007  . CORNEAL TRANSPLANT  2007  . KNEE SURGERY  1996   left knee arthoscopic  . ROTATOR CUFF REPAIR  2015  . Torn meniscus left knee  1996  . TRANSURETHRAL RESECTION OF PROSTATE  1997    There were no vitals filed for this visit.      Subjective Assessment - 08/12/17 1358    Subjective Pt states his hip pain went away a days ago. STates his back is not hurting currently.  States he's always practiced safe lifting using his legs.   Currently in Pain? No/denies                         [facility] Adult PT Treatment/Exercise - 08/12/17 0001      Lumbar Exercises: Stretches   Active Hamstring Stretch 3 reps;30 seconds   Active Hamstring Stretch  Limitations standing 12" step   Single Knee to Chest Stretch 2 reps;30 seconds   Single Knee to Chest Stretch Limitations bil   Lower Trunk Rotation 10 seconds;5 reps   Lower Trunk Rotation Limitations bil   Piriformis Stretch 2 reps;30 seconds   Piriformis Stretch Limitations Bil (supine Rt/sitting Lt)     Lumbar Exercises: Standing   Other Standing Lumbar Exercises 3D hip excursions 10 reps     Lumbar Exercises: Supine   Bent Knee Raise 10 reps   Bridge 10 reps   Straight Leg Raise 10 reps                PT Education - 08/12/17 1420    Education provided Yes   Education Details Reviewed HEP and goals. Given copy of initial evaluation.   Person(s) Educated Patient   Methods Explanation;Demonstration;Tactile cues;Handout;Verbal cues   Comprehension Verbalized understanding;Returned demonstration;Verbal cues required;Tactile cues required          PT Short Term Goals - 08/07/17 1048      PT SHORT TERM GOAL #1   Title Patient to demonstrate bilateral hamstrings and piriformis muscle groups as being no more than 20% limited in order to improve  mechanics during functional task performance    Time 3   Period Weeks   Status New   Target Date 08/28/17     PT SHORT TERM GOAL #2   Title Patient to be compliant with correct performance of appropriate HEP, to be updated PRN    Time 1   Period Weeks   Status New   Target Date 08/14/17           PT Long Term Goals - 08/07/17 1049      PT LONG TERM GOAL #1   Title Patient to demonstrate MMT as being 5/5 in all tested groups in order to assist in improving functional lifting and mechanics    Time 6   Period Weeks   Status New   Target Date 09/18/17     PT LONG TERM GOAL #2   Title Patient to experience low back and R hip pain as being no more than 1/10 with repetitive functional lifting with 20# in order to show improved functional status    Time 6   Period Weeks   Status New     PT LONG TERM GOAL #3   Title  Patient to be able to perform 30 repetitions of functional floor to shoulder height lifting with load of 20# and correct functional mechanics, low back and R hip pain being no more than 1/10, in order to show improved functional performance    Time 6   Period Weeks   Status New               Plan - 08/12/17 1422    Clinical Impression Statement Reveiwed HEP and goals. PT with difficulty completing seated piriformis stretch with Rt due to Rt knee discomfort.  Instructed with alternate way of completing in supine. Added 3D excursion to improve hip mobiltiy, instructed with standing hamstring stretch and added core stab exercises.  Discussed lifting but did not add this session.     Rehab Potential Good   Clinical Impairments Affecting Rehab Potential (+) motivated, active lifestyle; (-) regular repetitive lifting, history back surgery, chronicity of pain    PT Frequency 2x / week   PT Duration 6 weeks   PT Treatment/Interventions ADLs/Self Care Home Management;Cryotherapy;Moist Heat;DME Instruction;Gait training;Stair training;Functional mobility training;Therapeutic activities;Therapeutic exercise;Balance training;Neuromuscular re-education;Patient/family education;Manual techniques;Passive range of motion;Dry needling;Energy conservation;Taping   PT Next Visit Plan Continue to improve lumbar and hip mobility.  Begin functional lifting mechanics and  postural training next session.     PT Home Exercise Plan Eval: SKTC, lumbar rotations, HS stretch, piriformis stretch, lumbar towel roll    Consulted and Agree with Plan of Care Patient      Patient will benefit from skilled therapeutic intervention in order to improve the following deficits and impairments:  Improper body mechanics, Pain, Decreased mobility, Postural dysfunction, Decreased range of motion, Decreased strength, Hypomobility, Impaired flexibility  Visit Diagnosis: Chronic bilateral low back pain with bilateral  sciatica  Abnormal posture  Other symptoms and signs involving the musculoskeletal system     Problem List Patient Active Problem List   Diagnosis Date Noted  . Spondylolisthesis at L5-S1 level 12/13/2015  . Midline low back pain without sciatica 12/13/2015  . Pain in joint, shoulder region 03/23/2014  . Muscle weakness (generalized) 03/23/2014  . Decreased range of motion of left shoulder 03/23/2014  . Prostate cancer ([place]) 10/31/2011  . Spondylolisthesis   . BPH (benign prostatic hyperplasia)   . Malignant hyperthermia   . Adrenal adenoma  Teena Irani, PTA/CLT [phone]  Teena Irani 08/12/2017, 2:33 PM  [facility] [address] [place], [place], [address] Phone: [phone]   Fax:  [phone]  Name: Tracy Pratt MRN: [id number] Date of Birth: Mar 18, 1931

## 2017-08-14 ENCOUNTER — Encounter: Payer: Self-pay | Admitting: *Deleted

## 2017-08-14 ENCOUNTER — Encounter: Payer: Self-pay | Admitting: Gastroenterology

## 2017-08-14 ENCOUNTER — Ambulatory Visit (INDEPENDENT_AMBULATORY_CARE_PROVIDER_SITE_OTHER): Payer: Medicare Other | Admitting: Gastroenterology

## 2017-08-14 DIAGNOSIS — I6529 Occlusion and stenosis of unspecified carotid artery: Secondary | ICD-10-CM | POA: Diagnosis not present

## 2017-08-14 DIAGNOSIS — E162 Hypoglycemia, unspecified: Secondary | ICD-10-CM | POA: Insufficient documentation

## 2017-08-14 NOTE — Progress Notes (Signed)
Subjective:    Patient ID: Tracy Pratt, male    DOB: 07/28/1931, 83 y.o.   MRN: [id number]  [name], MD  HPI Has been HELPING. TROUBLE WITH EPISODES OF DIAPHORESIS SINCE AUG 2018. FEELS DIFFERENT AND SOMETIMES GETS A STRANGE FEELING AND FEELS VERY NAUSEATED. CAN'T BELCH. DOESN'T GET DIZZY. TINGLING FEELING IN HEAD. FEELS LIKE HE NEEDS A PIECE OF CANDY OR SUGAR. BOWELS GET REAL LOOSE. ONLY HAD 2 X IN ED. HAD ONE ON WAY TO [place]. ATE CANDY AND SOFT DRINK AND UP ON FEET IN 5 MINS. OK BUT DIDN'T FEEL GOOD. BOTH EPISODE AUG 2018: [place]. HAD BEEN WORKING OUTSIDE UNDER A SHELTER BUT IT WAS 64F. WEARS A SAFETY BELT--> STOMACH WAS HURTING SEVERELY AND 5 MINS LATER HAD AN EPISODE. NOW CAN'T STAND ANYTHING TO BE TIGHT AROUND HIS STOMACH. WEIGHT DOWN: 219 LBS JUL 2017 TO 207.8 LBS OCT 2018. WAS TAKING FLUID PILLS AND STOPPED DUE TO POLYPHARMACY. APPETITE POOR WHEN HE HAS THESE FEELINGS. OUTSIDE OF THE EPISODES CEREAL/MIKL AND SAUSAGE/EGG BISCUIT UNTIL ABOUT 2 PM. MAY HAVE LEFT CHEST TWINGE. HAS A KNOWN R GROIN HERNIA. AFTER APPENDECTOMY HAD PHANTOM PAIN FOR 20 YRS AND LIKELY DUE TO ADHESIONS. HAS CHILLS ONCE IN AWHILE/SWEATS HAS BEEN BAD x2 AUG 2018. ONLY 3 EPISODES BUT NOT BACK TO GOING A MILE A MINUTE.  MAY USE PROTEIN SHAKE IF NEEDED. WORKUP: HEART MONITOR(NOTHING), MONITOR FOR GLUCOSE(MULTIPLE EPISODES OF HYPOGLYCEMIA)   PT DENIES FEVER, CHILLS, HEMATOCHEZIA, vomiting, melena, diarrhea,  SHORTNESS OF BREATH,  CHANGE IN BOWEL IN HABITS, constipation, problems swallowing, problems with sedation, OR heartburn or indigestion.  Past Medical History:  Diagnosis Date  . Adrenal adenoma    Left  . Arthritis   . BPH (benign prostatic hyperplasia)   . Cataract 2007, 2009   Bilateral  . Essential hypertension   . Malignant hyperthermia   . Prostate cancer ([place]) 08/08/2007   Seed Implant and XRT  . Spondylolisthesis    L5-S1   Past Surgical History:  Procedure Laterality  Date  . APPENDECTOMY  1940  . BACK SURGERY  1979  . CATARACT EXTRACTION  2007  . CORNEAL TRANSPLANT  2007  . KNEE SURGERY  1996   left knee arthoscopic  . ROTATOR CUFF REPAIR  2015  . Torn meniscus left knee  1996  . TRANSURETHRAL RESECTION OF PROSTATE  1997   Allergies  Allergen Reactions  . Diclofenac Sodium Anaphylaxis  . Nabumetone Anaphylaxis  . Nsaids Anaphylaxis  . Bee Venom Swelling  . Desflurane     Malignant hyperthermia  . Enflurane     Malignant hyperthermia  . Halothane     Malignant hyperthermia  . Isoflurane     Malignant hyperthermia  . Sevoflurane     Malignant hyperthermia  . Succinylcholine     Malignant hyperthermia   Current Outpatient Prescriptions  Medication Sig Dispense Refill  . furosemide (LASIX) 40 MG tablet Take 40 mg by mouth daily as needed for fluid.     Marland Kitchen HYDROcodone-acetaminophen (NORCO) 7.5-325 MG tablet TAKE ONE TABLET BY MOUTH EVERY 4 HOURS AS NEEDED FOR MODERATE PAIN. MUST LAST 30 DAYS.    Marland Kitchen lisinopril-hydrochlorothiazide (PRINZIDE,ZESTORETIC) 10-12.5 MG tablet Take 1 tablet by mouth daily.    Marland Kitchen loteprednol (LOTEMAX) 0.5 % ophthalmic suspension Place 1 drop into both eyes at bedtime.     . methocarbamol (ROBAXIN) 500 MG tablet TAKE (1) TABLET BY MOUTH (3) TIMES DAILY. (Patient taking differently: TAKE (1) TABLET BY MOUTH (  3) TIMES DAILY AS NEEDED)    . Vitamin D, Ergocalciferol, (DRISDOL) 50000 units CAPS capsule Take 50,000 Units by mouth every 7 (seven) days.     Family History  Problem Relation Age of Onset  . Prostate cancer Brother   . Lung disease Brother        lobectomy   Review of Systems PER HPI OTHERWISE ALL SYSTEMS ARE NEGATIVE.    Objective:   Physical Exam  Constitutional: He is oriented to person, place, and time. He appears well-developed and well-nourished. No distress.  Appears younger than his stated age.  HENT:  Head: Normocephalic and atraumatic.  Mouth/Throat: Oropharynx is clear and moist. No  oropharyngeal exudate.  Eyes: Pupils are equal, round, and reactive to light. No scleral icterus.  Neck: Normal range of motion. Neck supple.  Cardiovascular: Normal rate, regular rhythm and normal heart sounds.   Pulmonary/Chest: Effort normal and breath sounds normal. No respiratory distress.  Abdominal: Soft. Bowel sounds are normal. He exhibits no distension. There is no tenderness.  Musculoskeletal: He exhibits edema (TRACE BILATERAL LOWER EXTREMITITES).  Lymphadenopathy:    He has no cervical adenopathy.  Neurological: He is alert and oriented to person, place, and time.  NO FOCAL DEFICITS  Psychiatric: He has a normal mood and affect.  Vitals reviewed.     Assessment & Plan:

## 2017-08-14 NOTE — Progress Notes (Signed)
CC'ED TO PCP 

## 2017-08-14 NOTE — Patient Instructions (Addendum)
COMPLETE  MRI ON FRI. I WILL CALL YOU WITH YOUR REPORT WITHIN IN 3 DAYS.  CARRY CANDY AT ALL TIMES.  YOUR FOLLOW UP WILL BE SCHEDULED AFTER RESULTS ARE KNOWN.

## 2017-08-14 NOTE — Assessment & Plan Note (Signed)
ETIOLOGY UNCLEAR.  DIFFERENTIAL DIAGNOSIS INCLUDES: INSULINOMA, NONISLET CELL TUMOR.  DISCUSSED WITH RADIOLOGY. PT NEEDS MRI/MRCP T O EVALUATE ABDOMEN. COMPLETE  MRI ON FRI. WILL CALL WITH YOUR REPORT WITHIN IN 3 DAYS. CARRY CANDY AT ALL TIMES. FOLLOW UP WILL BE SCHEDULED AFTER RESULTS ARE KNOWN.

## 2017-08-15 ENCOUNTER — Ambulatory Visit (HOSPITAL_COMMUNITY): Payer: Medicare Other | Admitting: Physical Therapy

## 2017-08-15 DIAGNOSIS — R29898 Other symptoms and signs involving the musculoskeletal system: Secondary | ICD-10-CM

## 2017-08-15 DIAGNOSIS — R293 Abnormal posture: Secondary | ICD-10-CM

## 2017-08-15 DIAGNOSIS — G8929 Other chronic pain: Secondary | ICD-10-CM | POA: Diagnosis not present

## 2017-08-15 DIAGNOSIS — M5442 Lumbago with sciatica, left side: Secondary | ICD-10-CM | POA: Diagnosis not present

## 2017-08-15 DIAGNOSIS — M5441 Lumbago with sciatica, right side: Principal | ICD-10-CM
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
## 2017-08-16 ENCOUNTER — Ambulatory Visit (HOSPITAL_COMMUNITY)
Admission: RE | Admit: 2017-08-16 | Discharge: 2017-08-16 | Disposition: A | Discharge status: Home / Self Care | Payer: Medicare Other | Source: Ambulatory Visit | Attending: Gastroenterology | Admitting: Gastroenterology

## 2017-08-16 ENCOUNTER — Other Ambulatory Visit: Payer: Self-pay | Admitting: Gastroenterology

## 2017-08-16 DIAGNOSIS — E162 Hypoglycemia, unspecified: Secondary | ICD-10-CM

## 2017-08-16 DIAGNOSIS — K689 Other disorders of retroperitoneum: Secondary | ICD-10-CM | POA: Diagnosis not present

## 2017-08-16 DIAGNOSIS — R932 Abnormal findings on diagnostic imaging of liver and biliary tract: Secondary | ICD-10-CM | POA: Diagnosis not present

## 2017-08-16 DIAGNOSIS — E279 Disorder of adrenal gland, unspecified: Secondary | ICD-10-CM | POA: Diagnosis not present

## 2017-08-16 DIAGNOSIS — K862 Cyst of pancreas: Secondary | ICD-10-CM | POA: Diagnosis not present

## 2017-08-16 DIAGNOSIS — N281 Cyst of kidney, acquired: Secondary | ICD-10-CM | POA: Diagnosis not present

## 2017-08-16 LAB — POCT I-STAT CREATININE: CREATININE: 1.1 mg/dL (ref 0.61–1.24)

## 2017-08-16 MED ORDER — GADOBENATE DIMEGLUMINE 529 MG/ML IV SOLN
19.0000 mL | Freq: Once | INTRAVENOUS | Status: AC | PRN
  Administered 2017-08-16: 19 mL via INTRAVENOUS

## 2017-08-19 ENCOUNTER — Encounter: Payer: Self-pay | Admitting: "Endocrinology

## 2017-08-19 ENCOUNTER — Ambulatory Visit (INDEPENDENT_AMBULATORY_CARE_PROVIDER_SITE_OTHER): Payer: Medicare Other | Admitting: "Endocrinology

## 2017-08-19 VITALS — BP 123/76 | HR 80 | Ht 69.5 in | Wt 223.0 lb

## 2017-08-19 DIAGNOSIS — I6529 Occlusion and stenosis of unspecified carotid artery: Secondary | ICD-10-CM

## 2017-08-19 DIAGNOSIS — E039 Hypothyroidism, unspecified: Secondary | ICD-10-CM | POA: Diagnosis not present

## 2017-08-19 DIAGNOSIS — R7309 Other abnormal glucose: Secondary | ICD-10-CM | POA: Diagnosis not present

## 2017-08-19 DIAGNOSIS — E279 Disorder of adrenal gland, unspecified: Secondary | ICD-10-CM

## 2017-08-19 DIAGNOSIS — R7303 Prediabetes: Secondary | ICD-10-CM

## 2017-08-19 DIAGNOSIS — E278 Other specified disorders of adrenal gland: Secondary | ICD-10-CM | POA: Insufficient documentation

## 2017-08-19 DIAGNOSIS — E038 Other specified hypothyroidism: Secondary | ICD-10-CM | POA: Insufficient documentation

## 2017-08-19 NOTE — Progress Notes (Signed)
Subjective:    Patient ID: Tracy Pratt, male    DOB: 1930/11/28, PCP [name], MD   Past Medical History:  Diagnosis Date  . Adrenal adenoma    Left  . Arthritis   . BPH (benign prostatic hyperplasia)   . Cataract 2007, 2009   Bilateral  . Essential hypertension   . Malignant hyperthermia   . Prostate cancer ([place]) 08/08/2007   Seed Implant and XRT  . Spondylolisthesis    L5-S1   Past Surgical History:  Procedure Laterality Date  . APPENDECTOMY  1940  . BACK SURGERY  1979  . CATARACT EXTRACTION  2007  . CORNEAL TRANSPLANT  2007  . KNEE SURGERY  1996   left knee arthoscopic  . ROTATOR CUFF REPAIR  2015  . Torn meniscus left knee  1996  . TRANSURETHRAL RESECTION OF PROSTATE  1997   Social History   Social History  . Marital status: Widowed    Spouse name: N/A  . Number of children: N/A  . Years of education: N/A   Social History Main Topics  . Smoking status: Former Smoker    Packs/day: 1.00    Years: 15.00    Types: Cigarettes  . Smokeless tobacco: Never Used     Comment: quit 28 years ago  . Alcohol use No  . Drug use: No  . Sexual activity: Not Asked   Other Topics Concern  . None   Social History Narrative  . None   Outpatient Encounter Prescriptions as of 08/19/2017  Medication Sig  . furosemide (LASIX) 40 MG tablet Take 40 mg by mouth daily as needed for fluid.   Marland Kitchen HYDROcodone-acetaminophen (NORCO) 7.5-325 MG tablet TAKE ONE TABLET BY MOUTH EVERY 4 HOURS AS NEEDED FOR MODERATE PAIN. MUST LAST 30 DAYS.  Marland Kitchen lisinopril-hydrochlorothiazide (PRINZIDE,ZESTORETIC) 10-12.5 MG tablet Take 1 tablet by mouth daily.  Marland Kitchen loteprednol (LOTEMAX) 0.5 % ophthalmic suspension Place 1 drop into both eyes at bedtime.   . methocarbamol (ROBAXIN) 500 MG tablet TAKE (1) TABLET BY MOUTH (3) TIMES DAILY. (Patient taking differently: TAKE (1) TABLET BY MOUTH (3) TIMES DAILY AS NEEDED)  . Vitamin D, Ergocalciferol, (DRISDOL) 50000 units CAPS capsule Take 50,000  Units by mouth every 7 (seven) days.   No facility-administered encounter medications on file as of 08/19/2017.    ALLERGIES: Allergies  Allergen Reactions  . Diclofenac Sodium Anaphylaxis  . Nabumetone Anaphylaxis  . Nsaids Anaphylaxis  . Bee Venom Swelling  . Desflurane     Malignant hyperthermia  . Enflurane     Malignant hyperthermia  . Halothane     Malignant hyperthermia  . Isoflurane     Malignant hyperthermia  . Sevoflurane     Malignant hyperthermia  . Succinylcholine     Malignant hyperthermia    VACCINATION STATUS:  There is no immunization history on file for this patient.  HPI Tracy Pratt is 83 y.o. male who presents today with a medical history as above. he is being seen in consultation for Hypoglycemia , hypothyroidism requested by [name], MD.  -Patient denies any prior history of diabetes nor any history of being treated with diabetes medications , however, on 2 prior occasions his A1c was found to be above 5.7% consistent with prediabetes. -He is accompanied by his grown daughter.  They deny any documented hypoglycemia below 70 mg/dL. -Due to symptoms  Including  palpitations, tremors, and fatigue he underwent continuous glucose monitoring from September 4-12, 2018.  He  did not have any hypoglycemic episodes documented.  His most recent A1c was 5.8% from July 02, 2017. - His dietary habit is not regular.  He consumes large amounts of processed carbohydrates including soda. -He denies any prior history of thyroid dysfunction.  His TSH was found to be 4.7 on July 04, 2017 (normal 0.4 5-4.5). -He is not currently on any antithyroid medications nor thyroid hormone. -He is also known to have stable left adrenal nodule measuring 1.0 she had suggested to have the patient insurance provider for his medical provider would be in the recent different with advanced diabetes of 89 years these are different vendors to so Medicare is a different Medicare  plan plans some of those and that list she sent Frederico Hamman said that there authorized provider for some of those plans so that I would go through and see which would make it easier for [place] for you rather plan the fingers treatment committee a couple of Medicare [employer] plan deals with his authorized 1 of these for him since healthcare the use of the only 3 providers is one that did they are out of network for his company his company was at work accompanies advanced medical looks like it is pretty common for 1-3 and a [employer] which she glucose initially, time cm on imagings performed in 2009, 2012, and recently 2018. -Initial basic hormone analysis was also negative including normal a.m. cortisol, p.m. cortisol, 24-hour urine VMA, as well as 5-HIAA. -  Review of Systems  Constitutional: + weight gain, + fatigue, no subjective hyperthermia, no subjective hypothermia Eyes: no blurry vision, no xerophthalmia ENT: no sore throat, no nodules palpated in throat, no dysphagia/odynophagia, no hoarseness Cardiovascular: no Chest Pain, no Shortness of Breath, no palpitations, no leg swelling Respiratory: no cough, no SOB Gastrointestinal: no Nausea/Vomiting/Diarhhea Musculoskeletal: no muscle/joint aches Skin: no rashes Neurological: no tremors, no numbness, no tingling, no dizziness Psychiatric: no depression, no anxiety  Objective:    BP 123/76   Pulse 80   Ht 5' 9.5" (1.765 m)   Wt 223 lb (101.2 kg)   BMI 32.46 kg/m   Wt Readings from Last 3 Encounters:  08/19/17 223 lb (101.2 kg)  08/14/17 207 lb 12.8 oz (94.3 kg)  07/16/17 208 lb (94.3 kg)    Physical Exam  Constitutional: + obese for height, not in acute distress, normal state of mind Eyes: PERRLA, EOMI, no exophthalmos ENT: moist mucous membranes, no thyromegaly, no cervical lymphadenopathy Cardiovascular: normal precordial activity, Regular Rate and Rhythm, no Murmur/Rubs/Gallops Respiratory:  adequate breathing efforts, no gross chest  deformity, Clear to auscultation bilaterally Gastrointestinal: abdomen soft, Non -tender, No distension, Bowel Sounds present Musculoskeletal: no gross deformities, strength intact in all four extremities Skin: moist, warm, no rashes Neurological: no tremor with outstretched hands, Deep tendon reflexes normal in all four extremities.  CMP ( most recent) CMP     Component Value Date/Time   NA 137 06/26/2017 1359   K 3.7 06/26/2017 1359   CL 102 06/26/2017 1359   CO2 26 06/26/2017 1359   GLUCOSE 99 06/26/2017 1359   BUN 12 06/26/2017 1359   CREATININE 1.10 08/16/2017 1607   CALCIUM 8.8 (L) 06/26/2017 1359   PROT 7.0 06/13/2017 1730   ALBUMIN 4.0 06/13/2017 1730   AST 23 06/13/2017 1730   ALT 24 06/13/2017 1730   ALKPHOS 49 06/13/2017 1730   BILITOT 0.9 06/13/2017 1730   GFRNONAA 60 (L) 06/26/2017 1359   GFRAA >60 06/26/2017 1359   July 04, 2017 24-hour urine 5-HIAA 4.5 (normal 0-0-14 0.9), 8 AM cortisol 12.9, p.m. cortisol 15.7 TSH 4.7 2 24-hour urine VMA 5.4 (normal 0-0-7005), hemoglobin A1c 5.8%  CT scan of abdomen with contrast performed at an outside facility in 2018 showed stable small right retroperitoneal soft tissue nodule and left adrenal nodule measuring 1 cm.  These both lesions appear to be stable compared to older chest CTA performed in 2009.  Assessment & Plan:   1. Prediabetes -Based on the history provided by the patient and his grown daughter, there has not been a documented hypoglycemia.  Given his history of prediabetes with A1c of 5.8% with possible hyperinsulinemia causing reactive hypoglycemia, he will benefit from consumption of less processed carbs. -  Suggestion is made for him to avoid simple carbohydrates  from his diet including Cakes, Sweet Desserts / Pastries, Ice Cream, Soda (diet and regular), Sweet Tea, Candies, Chips, Cookies, Store Bought Juices, Alcohol in Excess of  1-2 drinks a day, Artificial Sweeteners, and "Sugar-free" Products. This  will help patient to have stable blood glucose profile and potentially avoid unintended weight gain.  -He is provided with a meter to measure blood glucose when he is symptomatic and report if readings are below 70 mg/dL.  2. Subclinical hypothyroidism -With absence of any specific clinical symptoms TSH marginally elevated at 4.7 will not require therapeutic intervention at this time.  He will have more complete thyroid function tests including TSH, free T4, free T3 in 9 weeks with office visit.  3. Adrenal nodule ([place]) -This left adrenal nodule measuring 1 cm was observed to be stable over a period of 9 years from 2009 until 2018.  Basic endocrine workup including 24-hour urine 5 -HIAA, cortisol, VMA are all within normal limits.  This is indicative of benign, nonfunctioning left adrenal nodule.  this will not require any intervention at this time.   he will have a.m. cortisol before his next visit.    - I did not initiate any new prescriptions today.   - I advised patient to maintain close follow up with [name], MD for primary care needs. Follow up plan: Return in about 3 months (around 11/19/2017) for follow up with pre-visit labs.  Glade Lloyd, MD [facility] Phone: [phone]  Fax: [phone]   08/19/2017, 4:29 PM This note was partially dictated with voice recognition software. Similar sounding words can be transcribed inadequately or may not  be corrected upon review.

## 2017-08-20 ENCOUNTER — Ambulatory Visit (HOSPITAL_COMMUNITY): Payer: Medicare Other

## 2017-08-20 ENCOUNTER — Encounter (HOSPITAL_COMMUNITY): Payer: Self-pay

## 2017-08-20 DIAGNOSIS — G8929 Other chronic pain: Secondary | ICD-10-CM | POA: Diagnosis not present

## 2017-08-20 DIAGNOSIS — R293 Abnormal posture: Secondary | ICD-10-CM

## 2017-08-20 DIAGNOSIS — M5441 Lumbago with sciatica, right side: Secondary | ICD-10-CM | POA: Diagnosis not present

## 2017-08-20 DIAGNOSIS — M5442 Lumbago with sciatica, left side: Secondary | ICD-10-CM | POA: Diagnosis not present

## 2017-08-20 DIAGNOSIS — R29898 Other symptoms and signs involving the musculoskeletal system: Secondary | ICD-10-CM | POA: Diagnosis not present

## 2017-08-20 NOTE — Therapy (Signed)
[place], [place], [address] Phone: [phone]   Fax:  [phone]  Physical Therapy Treatment  Patient Details  Name: Tracy Pratt MRN: [id number] Date of Birth: [birth year] Referring Provider: Martie Round   Encounter Date: 08/20/2017      PT End of Session - 08/20/17 1307    Visit Number 4   Number of Visits 13   Date for PT Re-Evaluation 08/28/17   Authorization Type Medicare/Aetna Senior Supplement    Authorization Time Period 08/07/17 to 09/18/17   Authorization - Visit Number 4   Authorization - Number of Visits 10   PT Start Time 1301   PT Stop Time 1340   PT Time Calculation (min) 39 min   Activity Tolerance Patient tolerated treatment well   Behavior During Therapy [facility] for tasks assessed/performed      Past Medical History:  Diagnosis Date  . Adrenal adenoma    Left  . Arthritis   . BPH (benign prostatic hyperplasia)   . Cataract 2007, 2009   Bilateral  . Essential hypertension   . Malignant hyperthermia   . Prostate cancer ([place]) 08/08/2007   Seed Implant and XRT  . Spondylolisthesis    L5-S1    Past Surgical History:  Procedure Laterality Date  . APPENDECTOMY  1940  . BACK SURGERY  1979  . CATARACT EXTRACTION  2007  . CORNEAL TRANSPLANT  2007  . KNEE SURGERY  1996   left knee arthoscopic  . ROTATOR CUFF REPAIR  2015  . Torn meniscus left knee  1996  . TRANSURETHRAL RESECTION OF PROSTATE  1997    There were no vitals filed for this visit.      Subjective Assessment - 08/20/17 1304    Subjective Pt stated no real pain today, minimal ache at bottom of back.  Reports Rt hip pain has been resolved since beginning therapy.   Patient Stated Goals feel better    Currently in Pain? No/denies                         [facility] Adult PT Treatment/Exercise - 08/20/17 0001      Lumbar Exercises: Stretches   Active Hamstring Stretch 3 reps;30 seconds   Active  Hamstring Stretch Limitations supine with rope   Single Knee to Chest Stretch Limitations HEP   Lower Trunk Rotation 10 seconds  10 repsx 10" holds   Standing Extension Limitations 10x 5" holds     Lumbar Exercises: Standing   Lifting 10 reps;From floor   Lifting Limitations cueing for mechanics   Scapular Retraction Both;10 reps;Theraband   Theraband Level (Scapular Retraction) Level 2 (Red)   Row 10 reps   Theraband Level (Row) Level 2 (Red)   Shoulder Extension 10 reps;Theraband   Theraband Level (Shoulder Extension) Level 2 (Red)     Lumbar Exercises: Supine   Bent Knee Raise 10 reps;3 seconds   Bent Knee Raise Limitations Dead bug alternating with cueing    Bridge 15 reps                  PT Short Term Goals - 08/07/17 1048      PT SHORT TERM GOAL #1   Title Patient to demonstrate bilateral hamstrings and piriformis muscle groups as being no more than 20% limited in order to improve mechanics during functional task performance    Time 3   Period Weeks   Status New  Target Date 08/28/17     PT SHORT TERM GOAL #2   Title Patient to be compliant with correct performance of appropriate HEP, to be updated PRN    Time 1   Period Weeks   Status New   Target Date 08/14/17           PT Long Term Goals - 08/07/17 1049      PT LONG TERM GOAL #1   Title Patient to demonstrate MMT as being 5/5 in all tested groups in order to assist in improving functional lifting and mechanics    Time 6   Period Weeks   Status New   Target Date 09/18/17     PT LONG TERM GOAL #2   Title Patient to experience low back and R hip pain as being no more than 1/10 with repetitive functional lifting with 20# in order to show improved functional status    Time 6   Period Weeks   Status New     PT LONG TERM GOAL #3   Title Patient to be able to perform 30 repetitions of functional floor to shoulder height lifting with load of 20# and correct functional mechanics, low back and R  hip pain being no more than 1/10, in order to show improved functional performance    Time 6   Period Weeks   Status New               Plan - 08/20/17 1314    Clinical Impression Statement Continued session focus with lumbar mobility and proximal strengthening.  Began postural strengthening and lumbar extension.  Pt able to demonstrate good form with new exercises with minimal cueing required for form.     Rehab Potential Good   Clinical Impairments Affecting Rehab Potential (+) motivated, active lifestyle; (-) regular repetitive lifting, history back surgery, chronicity of pain    PT Frequency 2x / week   PT Duration 6 weeks   PT Treatment/Interventions ADLs/Self Care Home Management;Cryotherapy;Moist Heat;DME Instruction;Gait training;Stair training;Functional mobility training;Therapeutic activities;Therapeutic exercise;Balance training;Neuromuscular re-education;Patient/family education;Manual techniques;Passive range of motion;Dry needling;Energy conservation;Taping   PT Next Visit Plan Continue to improve lumbar and hip mobility.   Next session begin 3D hip excursion to improve mobility and gluteal strengthneing.     PT Home Exercise Plan Eval: SKTC, lumbar rotations, HS stretch, piriformis stretch, lumbar towel roll       Patient will benefit from skilled therapeutic intervention in order to improve the following deficits and impairments:  Improper body mechanics, Pain, Decreased mobility, Postural dysfunction, Decreased range of motion, Decreased strength, Hypomobility, Impaired flexibility  Visit Diagnosis: Chronic bilateral low back pain with bilateral sciatica  Abnormal posture  Other symptoms and signs involving the musculoskeletal system     Problem List Patient Active Problem List   Diagnosis Date Noted  . Prediabetes 08/19/2017  . Subclinical hypothyroidism 08/19/2017  . Adrenal nodule ([place]) 08/19/2017  . Hypoglycemia 08/14/2017  . Spondylolisthesis at  L5-S1 level 12/13/2015  . Midline low back pain without sciatica 12/13/2015  . Pain in joint, shoulder region 03/23/2014  . Muscle weakness (generalized) 03/23/2014  . Decreased range of motion of left shoulder 03/23/2014  . Prostate cancer ([place]) 10/31/2011  . Spondylolisthesis   . BPH (benign prostatic hyperplasia)   . Malignant hyperthermia   . Adrenal adenoma    Ihor Austin, LPTA; [place]  Aldona Lento 08/20/2017, 1:42 PM  [facility] [address] [place], [place], [address] Phone:  [phone]   Fax:  [phone]  Name: Tracy Pratt MRN: [id number] Date of Birth: [birth year]

## 2017-08-21 ENCOUNTER — Telehealth: Payer: Self-pay | Admitting: Gastroenterology

## 2017-08-21 DIAGNOSIS — R1013 Epigastric pain: Secondary | ICD-10-CM

## 2017-08-21 NOTE — Progress Notes (Signed)
ON RECALL  °

## 2017-08-21 NOTE — Telephone Encounter (Signed)
Called patient TO DISCUSS RESULTS. LVM-CALL [phone] TO DISCUSS. PT HAS NO SOURCE FOR ANOREXIA AND EPISODES OF DIAPHORESIS ON MRI. WOULD CHECK H PYLORI BREATH TEST AND CONSIDER EGD AT [facility] FOR DYSPEPSIA/ANOREXIA/WEIGHT LOSS.

## 2017-08-22 ENCOUNTER — Ambulatory Visit (HOSPITAL_COMMUNITY): Payer: Medicare Other

## 2017-08-22 ENCOUNTER — Encounter (HOSPITAL_COMMUNITY): Payer: Self-pay

## 2017-08-22 DIAGNOSIS — M5442 Lumbago with sciatica, left side: Principal | ICD-10-CM

## 2017-08-22 DIAGNOSIS — M5441 Lumbago with sciatica, right side: Principal | ICD-10-CM

## 2017-08-22 DIAGNOSIS — R293 Abnormal posture: Secondary | ICD-10-CM | POA: Diagnosis not present

## 2017-08-22 DIAGNOSIS — G8929 Other chronic pain: Secondary | ICD-10-CM

## 2017-08-22 DIAGNOSIS — R29898 Other symptoms and signs involving the musculoskeletal system: Secondary | ICD-10-CM | POA: Diagnosis not present

## 2017-08-22 NOTE — Patient Instructions (Signed)
On Elbows (Prone)    Rise up on elbows as high as possible, keeping hips on floor. Hold 30 seconds. Repeat 5 times per set. Do 1-2 sets per day.  http://orth.exer.us/92   Copyright  VHI. All rights reserved.   Back Hyperextension: Using Arms    Lying face down with arms bent, inhale. Then while exhaling, straighten arms. Hold 10 seconds. Slowly return to starting position. Repeat 10 times per set.  Copyright  VHI. All rights reserved.

## 2017-08-22 NOTE — Telephone Encounter (Signed)
Patient returned your call and he can be reached at [phone].  If you can't reach him he would like for you to speak with his daughter, Magda Paganini about results and recommendations.

## 2017-08-22 NOTE — Telephone Encounter (Signed)
Pt called. Discussed resulTSs. Next step H pylori breath test. WILL COORDINATE WITH DAUGHTER TO COMPLETE. PATIENT VOICED HIS UNDERSTANDING.

## 2017-08-22 NOTE — Therapy (Signed)
[place], [place], [address] Phone: [phone]   Fax:  [phone]  Physical Therapy Treatment  Patient Details  Name: Tracy Pratt MRN: [id number] Date of Birth: [birth year] Referring Provider: Martie Round   Encounter Date: 08/22/2017      PT End of Session - 08/22/17 1304    Visit Number 5   Number of Visits 13   Date for PT Re-Evaluation 08/28/17   Authorization Type Medicare/Aetna Senior Supplement    Authorization Time Period 08/07/17 to 09/18/17   Authorization - Visit Number 5   Authorization - Number of Visits 10   PT Start Time 1300   PT Stop Time 1343   PT Time Calculation (min) 43 min   Activity Tolerance Patient tolerated treatment well   Behavior During Therapy [facility] for tasks assessed/performed      Past Medical History:  Diagnosis Date  . Adrenal adenoma    Left  . Arthritis   . BPH (benign prostatic hyperplasia)   . Cataract 2007, 2009   Bilateral  . Essential hypertension   . Malignant hyperthermia   . Prostate cancer ([place]) 08/08/2007   Seed Implant and XRT  . Spondylolisthesis    L5-S1    Past Surgical History:  Procedure Laterality Date  . APPENDECTOMY  1940  . BACK SURGERY  1979  . CATARACT EXTRACTION  2007  . CORNEAL TRANSPLANT  2007  . KNEE SURGERY  1996   left knee arthoscopic  . ROTATOR CUFF REPAIR  2015  . Torn meniscus left knee  1996  . TRANSURETHRAL RESECTION OF PROSTATE  1997    There were no vitals filed for this visit.      Subjective Assessment - 08/22/17 1301    Subjective Pt reports some soreness, no real pain.     Patient Stated Goals feel better    Currently in Pain? No/denies                         [facility] Adult PT Treatment/Exercise - 08/22/17 0001      Lumbar Exercises: Stretches   Active Hamstring Stretch 2 reps;30 seconds   Active Hamstring Stretch Limitations supine with rope   Standing Extension Limitations 10x 5"  holds   Prone on Elbows Stretch --  2 minutes   Prone on Elbows Stretch Limitations 2 min   Press Ups 5 reps;10 seconds     Lumbar Exercises: Standing   Functional Squats 10 reps   Functional Squats Limitations 3D hip excursion   Lifting 10 reps;From floor   Lifting Limitations cueing for mechanics   Scapular Retraction 15 reps   Theraband Level (Scapular Retraction) Level 2 (Red)   Row 15 reps   Theraband Level (Row) Level 2 (Red)   Shoulder Extension 15 reps   Theraband Level (Shoulder Extension) Level 2 (Red)   Other Standing Lumbar Exercises 3D hip excursions 10 reps                  PT Short Term Goals - 08/07/17 1048      PT SHORT TERM GOAL #1   Title Patient to demonstrate bilateral hamstrings and piriformis muscle groups as being no more than 20% limited in order to improve mechanics during functional task performance    Time 3   Period Weeks   Status New   Target Date 08/28/17     PT SHORT TERM GOAL #2  Title Patient to be compliant with correct performance of appropriate HEP, to be updated PRN    Time 1   Period Weeks   Status New   Target Date 08/14/17           PT Long Term Goals - 08/07/17 1049      PT LONG TERM GOAL #1   Title Patient to demonstrate MMT as being 5/5 in all tested groups in order to assist in improving functional lifting and mechanics    Time 6   Period Weeks   Status New   Target Date 09/18/17     PT LONG TERM GOAL #2   Title Patient to experience low back and R hip pain as being no more than 1/10 with repetitive functional lifting with 20# in order to show improved functional status    Time 6   Period Weeks   Status New     PT LONG TERM GOAL #3   Title Patient to be able to perform 30 repetitions of functional floor to shoulder height lifting with load of 20# and correct functional mechanics, low back and R hip pain being no more than 1/10, in order to show improved functional performance    Time 6   Period Weeks    Status New               Plan - 08/22/17 1311    Clinical Impression Statement Resumed 3D hip excursion for gluteal strengthening and hip mobility.  Added POE/pressup to address lumbar extension, pt able to demonstrate with good form and technqiues, given additional print out to add to HEP.  Pt tolerated well with new exercises and good form with all exercises  with minimal cueing for form.  No reports of pain through session.     Rehab Potential Good   Clinical Impairments Affecting Rehab Potential (+) motivated, active lifestyle; (-) regular repetitive lifting, history back surgery, chronicity of pain    PT Frequency 2x / week   PT Duration 6 weeks   PT Treatment/Interventions ADLs/Self Care Home Management;Cryotherapy;Moist Heat;DME Instruction;Gait training;Stair training;Functional mobility training;Therapeutic activities;Therapeutic exercise;Balance training;Neuromuscular re-education;Patient/family education;Manual techniques;Passive range of motion;Dry needling;Energy conservation;Taping   PT Next Visit Plan Continue to improve lumbar and hip mobility.  Next session begin lunges and SLS for stability.   PT Home Exercise Plan Eval: SKTC, lumbar rotations, HS stretch, piriformis stretch, lumbar towel roll; poe, press up      Patient will benefit from skilled therapeutic intervention in order to improve the following deficits and impairments:  Improper body mechanics, Pain, Decreased mobility, Postural dysfunction, Decreased range of motion, Decreased strength, Hypomobility, Impaired flexibility  Visit Diagnosis: Chronic bilateral low back pain with bilateral sciatica  Abnormal posture  Other symptoms and signs involving the musculoskeletal system     Problem List Patient Active Problem List   Diagnosis Date Noted  . Prediabetes 08/19/2017  . Subclinical hypothyroidism 08/19/2017  . Adrenal nodule ([place]) 08/19/2017  . Hypoglycemia 08/14/2017  . Spondylolisthesis at  L5-S1 level 12/13/2015  . Midline low back pain without sciatica 12/13/2015  . Pain in joint, shoulder region 03/23/2014  . Muscle weakness (generalized) 03/23/2014  . Decreased range of motion of left shoulder 03/23/2014  . Prostate cancer ([place]) 10/31/2011  . Spondylolisthesis   . BPH (benign prostatic hyperplasia)   . Malignant hyperthermia   . Adrenal adenoma    Ihor Austin, LPTA; [place]  Aldona Lento 08/22/2017, 1:45 PM  [facility] [place], [place], [address] Phone: [phone]   Fax:  [phone]  Name: Tracy Pratt MRN: [id number] Date of Birth: 1931-10-16

## 2017-08-23 NOTE — Telephone Encounter (Signed)
CC'ED TO PCP 

## 2017-08-27 ENCOUNTER — Ambulatory Visit (HOSPITAL_COMMUNITY): Payer: Medicare Other | Admitting: Physical Therapy

## 2017-08-27 ENCOUNTER — Encounter (HOSPITAL_COMMUNITY): Payer: Self-pay | Admitting: Physical Therapy

## 2017-08-27 DIAGNOSIS — R29898 Other symptoms and signs involving the musculoskeletal system: Secondary | ICD-10-CM | POA: Diagnosis not present

## 2017-08-27 DIAGNOSIS — M5442 Lumbago with sciatica, left side: Principal | ICD-10-CM

## 2017-08-27 DIAGNOSIS — G8929 Other chronic pain: Secondary | ICD-10-CM | POA: Diagnosis not present

## 2017-08-27 DIAGNOSIS — R293 Abnormal posture: Secondary | ICD-10-CM

## 2017-08-27 DIAGNOSIS — M5441 Lumbago with sciatica, right side: Secondary | ICD-10-CM | POA: Diagnosis not present

## 2017-08-27 NOTE — Therapy (Signed)
[place], [place], [address] Phone: [phone]   Fax:  [phone]  Physical Therapy Treatment (Re-Assessment)  Patient Details  Name: Tracy Pratt MRN: [id number] Date of Birth: [birth year] Referring Provider: Martie Round   Encounter Date: 08/27/2017      PT End of Session - 08/27/17 1542    Visit Number 6   Number of Visits 13   Date for PT Re-Evaluation 09/17/17   Authorization Type Medicare/Aetna Senior Supplement    Authorization Time Period 08/07/17 to 09/18/17   Authorization - Visit Number 6   Authorization - Number of Visits 16   PT Start Time 1346   PT Stop Time 1428   PT Time Calculation (min) 42 min   Activity Tolerance Patient tolerated treatment well   Behavior During Therapy [facility] for tasks assessed/performed      Past Medical History:  Diagnosis Date  . Adrenal adenoma    Left  . Arthritis   . BPH (benign prostatic hyperplasia)   . Cataract 2007, 2009   Bilateral  . Essential hypertension   . Malignant hyperthermia   . Prostate cancer ([place]) 08/08/2007   Seed Implant and XRT  . Spondylolisthesis    L5-S1    Past Surgical History:  Procedure Laterality Date  . APPENDECTOMY  1940  . BACK SURGERY  1979  . CATARACT EXTRACTION  2007  . CORNEAL TRANSPLANT  2007  . KNEE SURGERY  1996   left knee arthoscopic  . ROTATOR CUFF REPAIR  2015  . Torn meniscus left knee  1996  . TRANSURETHRAL RESECTION OF PROSTATE  1997    There were no vitals filed for this visit.      Subjective Assessment - 08/27/17 1348    Subjective Patient arrives stating he is sore and has been fighting some issues with low blood sugars recently. He has been sore from PT, he no longer has pain in his back however, his R hip pain has also improved. No falls or close calls recently.    How long can you sit comfortably? 10/30- unlimited but standing is more tolerable, his HS muscles go to sleep when seated    How long can you stand comfortably? 10/30- unsure    How long can you walk comfortably? 10/30- unsure    Patient Stated Goals feel better    Currently in Pain? Yes   Pain Score 3    Pain Location Back   Pain Orientation Right;Left   Pain Descriptors / Indicators Sore   Pain Type Chronic pain   Pain Radiating Towards none    Pain Onset More than a month ago   Pain Frequency Constant   Aggravating Factors  nothing    Pain Relieving Factors nothing    Effect of Pain on Daily Activities has stopped doing things he usually does             [facility] PT Assessment - 08/27/17 0001      Observation/Other Assessments   Observations prone press ups- no change; correct functional mechanics      AROM   Lumbar Flexion WNL    Lumbar Extension mild limitation    Lumbar - Right Side Bend WNL    Lumbar - Left Side Bend WNL      Strength   Right Hip Flexion 4+/5   Right Hip Extension 5/5   Right Hip ABduction 5/5   Left Hip Flexion 4+/5   Left Hip Extension  5/5   Left Hip ABduction 5/5   Right Knee Flexion 5/5   Right Knee Extension 5/5   Left Knee Flexion 5/5   Left Knee Extension 5/5   Right Ankle Dorsiflexion 5/5   Left Ankle Dorsiflexion 5/5     Flexibility   Hamstrings mild limitation    Piriformis moderate limitation B                      [facility] Adult PT Treatment/Exercise - 08/27/17 0001      Lumbar Exercises: Supine   Other Supine Lumbar Exercises lumbar traction on chair x2 minutes    Other Supine Lumbar Exercises frog stretch 3x30 seconds                 PT Education - 08/27/17 1541    Education provided Yes   Education Details progress with skilled PT services, POC moving forward, lumbar disc structure and potential involvement/non-involvement in pain    Person(s) Educated Patient   Methods Explanation   Comprehension Verbalized understanding          PT Short Term Goals - 08/27/17 1409      PT SHORT TERM GOAL #1   Title Patient to  demonstrate bilateral hamstrings and piriformis muscle groups as being no more than 20% limited in order to improve mechanics during functional task performance    Baseline 10/30- HS have met goal, hips remain tight    Time 3   Period Weeks   Status Partially Met     PT SHORT TERM GOAL #2   Title Patient to be compliant with correct performance of appropriate HEP, to be updated PRN    Baseline 10/30- mostly compliant    Time 1   Period Weeks   Status Partially Met           PT Long Term Goals - 08/27/17 1410      PT LONG TERM GOAL #1   Title Patient to demonstrate MMT as being 5/5 in all tested groups in order to assist in improving functional lifting and mechanics    Baseline 10/30- fairly strong    Time 6   Period Weeks   Status Partially Met     PT LONG TERM GOAL #2   Title Patient to experience low back and R hip pain as being no more than 1/10 with repetitive functional lifting with 20# in order to show improved functional status    Baseline 10/30- reports this would prorbably be ok    Time 6   Period Weeks   Status On-going     PT LONG TERM GOAL #3   Title Patient to be able to perform 30 repetitions of functional floor to shoulder height lifting with load of 20# and correct functional mechanics, low back and R hip pain being no more than 1/10, in order to show improved functional performance    Baseline 10/30- correct mechanics    Time 6   Period Weeks   Status Partially Met               Plan - 08/27/17 1542    Clinical Impression Statement Re-assessment performed today. Patient shows some mild objective improvements in objective measures, and also demonstrates some improvements in pain quality and QOL. He may benefit from a continuation of skilled PT services moving forward to further reduce pain and improve tolerance to activity moving forward.    Rehab Potential Good   Clinical Impairments Affecting Rehab  Potential (+) motivated, active lifestyle; (-)  regular repetitive lifting, history back surgery, chronicity of pain    PT Frequency 2x / week   PT Duration 3 weeks   PT Treatment/Interventions ADLs/Self Care Home Management;Cryotherapy;Moist Heat;DME Instruction;Gait training;Stair training;Functional mobility training;Therapeutic activities;Therapeutic exercise;Balance training;Neuromuscular re-education;Patient/family education;Manual techniques;Passive range of motion;Dry needling;Energy conservation;Taping   PT Next Visit Plan Caution with excessive lumbar extension due to spondylolisthesis!! Continue to improve lumbar and hip mobility.  Next session begin lunges and SLS for stability. Update HEP. Trial hip flexor stretch and hip IR box walks.    PT Home Exercise Plan Eval: SKTC, lumbar rotations, HS stretch, piriformis stretch, lumbar towel roll; poe, press up   Consulted and Agree with Plan of Care Patient      Patient will benefit from skilled therapeutic intervention in order to improve the following deficits and impairments:  Improper body mechanics, Pain, Decreased mobility, Postural dysfunction, Decreased range of motion, Decreased strength, Hypomobility, Impaired flexibility  Visit Diagnosis: Chronic bilateral low back pain with bilateral sciatica  Abnormal posture  Other symptoms and signs involving the musculoskeletal system       G-Codes - 09-02-2017 1543    Functional Assessment Tool Used (Outpatient Only) Based on skilled clinical assessment of posture, ROM, biomechanics, flexibility    Functional Limitation Mobility: Walking and moving around   Mobility: Walking and Moving Around Current Status ([id number]) At least 20 percent but less than 40 percent impaired, limited or restricted   Mobility: Walking and Moving Around Goal Status [phone]) At least 20 percent but less than 40 percent impaired, limited or restricted      Problem List Patient Active Problem List   Diagnosis Date Noted  . Prediabetes 08/19/2017  .  Subclinical hypothyroidism 08/19/2017  . Adrenal nodule ([place]) 08/19/2017  . Hypoglycemia 08/14/2017  . Spondylolisthesis at L5-S1 level 12/13/2015  . Midline low back pain without sciatica 12/13/2015  . Pain in joint, shoulder region 03/23/2014  . Muscle weakness (generalized) 03/23/2014  . Decreased range of motion of left shoulder 03/23/2014  . Prostate cancer ([place]) 10/31/2011  . Spondylolisthesis   . BPH (benign prostatic hyperplasia)   . Malignant hyperthermia   . Adrenal adenoma     Deniece Ree PT, DPT [place] [address] [place], [place], [address] Phone: [phone]   Fax:  [phone]  Name: Kyser Wandel MRN: [id number] Date of Birth: Aug 31, 1931

## 2017-08-29 ENCOUNTER — Encounter (HOSPITAL_COMMUNITY): Payer: Self-pay

## 2017-08-29 ENCOUNTER — Ambulatory Visit (HOSPITAL_COMMUNITY): Payer: Medicare Other | Attending: Physician Assistant

## 2017-08-29 DIAGNOSIS — M5441 Lumbago with sciatica, right side: Secondary | ICD-10-CM | POA: Insufficient documentation

## 2017-08-29 DIAGNOSIS — G8929 Other chronic pain: Secondary | ICD-10-CM | POA: Insufficient documentation

## 2017-08-29 DIAGNOSIS — M5442 Lumbago with sciatica, left side: Secondary | ICD-10-CM | POA: Diagnosis not present

## 2017-08-29 DIAGNOSIS — R293 Abnormal posture: Secondary | ICD-10-CM | POA: Diagnosis not present

## 2017-08-29 DIAGNOSIS — R29898 Other symptoms and signs involving the musculoskeletal system: Secondary | ICD-10-CM

## 2017-08-29 NOTE — Patient Instructions (Signed)
SINGLE LIMB STANCE    Stance: single leg on floor. Raise leg. Hold 30 seconds. Repeat with other leg. 5 reps per set, 1-2 sets per everyday.  Copyright  VHI. All rights reserved.   FUNCTIONAL MOBILITY: Squat    Stance: shoulder-width on floor. Bend hips and knees. Keep back straight. Do not allow knees to bend past toes. Squeeze glutes and quads to stand. 10 reps per set, 2 sets per day.  Copyright  VHI. All rights reserved.

## 2017-08-29 NOTE — Therapy (Signed)
[place], [place], [address] Phone: [phone]   Fax:  [phone]  Physical Therapy Treatment  Patient Details  Name: Tracy Pratt MRN: [id number] Date of Birth: 1931-09-27 Referring Provider: Martie Round   Encounter Date: 08/29/2017      PT End of Session - 08/29/17 1352    Visit Number 7   Number of Visits 13   Date for PT Re-Evaluation 09/17/17   Authorization Type Medicare/Aetna Senior Supplement    Authorization Time Period 08/07/17 to 09/18/17   Authorization - Visit Number 7   Authorization - Number of Visits 16   PT Start Time 6333   PT Stop Time 1428   PT Time Calculation (min) 42 min   Activity Tolerance Patient tolerated treatment well;No increased pain   Behavior During Therapy [facility] for tasks assessed/performed      Past Medical History:  Diagnosis Date  . Adrenal adenoma    Left  . Arthritis   . BPH (benign prostatic hyperplasia)   . Cataract 2007, 2009   Bilateral  . Essential hypertension   . Malignant hyperthermia   . Prostate cancer ([place]) 08/08/2007   Seed Implant and XRT  . Spondylolisthesis    L5-S1    Past Surgical History:  Procedure Laterality Date  . APPENDECTOMY  1940  . BACK SURGERY  1979  . CATARACT EXTRACTION  2007  . CORNEAL TRANSPLANT  2007  . KNEE SURGERY  1996   left knee arthoscopic  . ROTATOR CUFF REPAIR  2015  . Torn meniscus left knee  1996  . TRANSURETHRAL RESECTION OF PROSTATE  1997    There were no vitals filed for this visit.      Subjective Assessment - 08/29/17 1349    Subjective Pt reports increased pain following prone press-ups last session, reports increased soreness lower back/Rt hip pain pain scale 1/10.   Patient Stated Goals feel better    Currently in Pain? Yes   Pain Score 1    Pain Location Back   Pain Orientation Lower;Right   Pain Descriptors / Indicators Sore   Pain Type Chronic pain   Pain Onset More than a month ago   Pain Frequency Constant   Aggravating Factors  nothing   Pain Relieving Factors nothing   Effect of Pain on Daily Activities has stoopped doing things he usually does                         [facility] Adult PT Treatment/Exercise - 08/29/17 0001      Lumbar Exercises: Stretches   Quad Stretch 1 rep;30 seconds   Quad Stretch Limitations standing hip flexor stretch on 2nd step, with ab set and spine in neutral     Lumbar Exercises: Standing   Functional Squats 10 reps   Functional Squats Limitations 3D hip excursion (avoided extension)   Lifting 10 reps;From floor   Lifting Limitations improved mechanics, able to verbalize appropriate form, min cueing to reduce lumbar flexion   Forward Lunge 15 reps   Forward Lunge Limitations with ab set and spine in neutral, 6in step   Scapular Retraction 15 reps   Theraband Level (Scapular Retraction) Level 3 (Green)   Row 15 reps   Theraband Level (Row) Level 3 (Green)   Shoulder Extension 15 reps;Theraband   Theraband Level (Shoulder Extension) Level 3 (Green)   Other Standing Lumbar Exercises 3D hip excursions 10 reps  PT Short Term Goals - 08/27/17 1409      PT SHORT TERM GOAL #1   Title Patient to demonstrate bilateral hamstrings and piriformis muscle groups as being no more than 20% limited in order to improve mechanics during functional task performance    Baseline 10/30- HS have met goal, hips remain tight    Time 3   Period Weeks   Status Partially Met     PT SHORT TERM GOAL #2   Title Patient to be compliant with correct performance of appropriate HEP, to be updated PRN    Baseline 10/30- mostly compliant    Time 1   Period Weeks   Status Partially Met           PT Long Term Goals - 08/27/17 1410      PT LONG TERM GOAL #1   Title Patient to demonstrate MMT as being 5/5 in all tested groups in order to assist in improving functional lifting and mechanics    Baseline 10/30- fairly  strong    Time 6   Period Weeks   Status Partially Met     PT LONG TERM GOAL #2   Title Patient to experience low back and R hip pain as being no more than 1/10 with repetitive functional lifting with 20# in order to show improved functional status    Baseline 10/30- reports this would prorbably be ok    Time 6   Period Weeks   Status On-going     PT LONG TERM GOAL #3   Title Patient to be able to perform 30 repetitions of functional floor to shoulder height lifting with load of 20# and correct functional mechanics, low back and R hip pain being no more than 1/10, in order to show improved functional performance    Baseline 10/30- correct mechanics    Time 6   Period Weeks   Status Partially Met               Plan - 08/29/17 1422    Clinical Impression Statement Session focus on functional strengthening for pain control.  Added lunges and SLS for lumbar stability, noted increased instability with Rt LE SLS.Marland Kitchen  Reviewed HEP compliance with additional SLS and squats, removed prone exercises from HEP.  No reoprts of pain at EOS.     Rehab Potential Good   Clinical Impairments Affecting Rehab Potential (+) motivated, active lifestyle; (-) regular repetitive lifting, history back surgery, chronicity of pain    PT Frequency 2x / week   PT Duration 3 weeks   PT Treatment/Interventions ADLs/Self Care Home Management;Cryotherapy;Moist Heat;DME Instruction;Gait training;Stair training;Functional mobility training;Therapeutic activities;Therapeutic exercise;Balance training;Neuromuscular re-education;Patient/family education;Manual techniques;Passive range of motion;Dry needling;Energy conservation;Taping   PT Next Visit Plan Continue to improve lumbar and hip mobility.  Next session begin trial hip IR box walks.    PT Home Exercise Plan Eval: SKTC, lumbar rotations, HS stretch, piriformis stretch, lumbar towel roll; SLS and squat      Patient will benefit from skilled therapeutic  intervention in order to improve the following deficits and impairments:  Improper body mechanics, Pain, Decreased mobility, Postural dysfunction, Decreased range of motion, Decreased strength, Hypomobility, Impaired flexibility  Visit Diagnosis: Chronic bilateral low back pain with bilateral sciatica  Abnormal posture  Other symptoms and signs involving the musculoskeletal system     Problem List Patient Active Problem List   Diagnosis Date Noted  . Prediabetes 08/19/2017  . Subclinical hypothyroidism 08/19/2017  . Adrenal nodule ([place]) 08/19/2017  .  Hypoglycemia 08/14/2017  . Spondylolisthesis at L5-S1 level 12/13/2015  . Midline low back pain without sciatica 12/13/2015  . Pain in joint, shoulder region 03/23/2014  . Muscle weakness (generalized) 03/23/2014  . Decreased range of motion of left shoulder 03/23/2014  . Prostate cancer ([place]) 10/31/2011  . Spondylolisthesis   . BPH (benign prostatic hyperplasia)   . Malignant hyperthermia   . Adrenal adenoma    Ihor Austin, LPTA; [place]  Aldona Lento 08/29/2017, 2:33 PM  [facility] [address] [place], [place], [address] Phone: [phone]   Fax:  [phone]  Name: Tracy Pratt MRN: [id number] Date of Birth: 20-Jun-1931

## 2017-09-03 ENCOUNTER — Encounter (HOSPITAL_COMMUNITY): Payer: Self-pay | Admitting: Physical Therapy

## 2017-09-03 ENCOUNTER — Ambulatory Visit (HOSPITAL_COMMUNITY): Payer: Medicare Other | Admitting: Physical Therapy

## 2017-09-03 DIAGNOSIS — M5441 Lumbago with sciatica, right side: Secondary | ICD-10-CM | POA: Diagnosis not present

## 2017-09-03 DIAGNOSIS — R29898 Other symptoms and signs involving the musculoskeletal system: Secondary | ICD-10-CM | POA: Diagnosis not present

## 2017-09-03 DIAGNOSIS — M5442 Lumbago with sciatica, left side: Secondary | ICD-10-CM | POA: Diagnosis not present

## 2017-09-03 DIAGNOSIS — G8929 Other chronic pain: Secondary | ICD-10-CM

## 2017-09-03 DIAGNOSIS — R293 Abnormal posture: Secondary | ICD-10-CM | POA: Diagnosis not present

## 2017-09-03 NOTE — Therapy (Signed)
[place], [place], [address] Phone: [phone]   Fax:  [phone]  Physical Therapy Treatment  Patient Details  Name: Tracy Pratt MRN: [id number] Date of Birth: July 26, 1931 Referring Provider: Martie Round    Encounter Date: 09/03/2017  PT End of Session - 09/03/17 1339    Visit Number  8    Number of Visits  13    Date for PT Re-Evaluation  09/17/17    Authorization Type  Medicare/[employer] Senior Supplement     Authorization Time Period  08/07/17 to 09/18/17    Authorization - Visit Number  8    Authorization - Number of Visits  16    PT Start Time  1301    PT Stop Time  1339    PT Time Calculation (min)  38 min    Activity Tolerance  Patient tolerated treatment well;No increased pain    Behavior During Therapy  WFL for tasks assessed/performed       Past Medical History:  Diagnosis Date  . Adrenal adenoma    Left  . Arthritis   . BPH (benign prostatic hyperplasia)   . Cataract 2007, 2009   Bilateral  . Essential hypertension   . Malignant hyperthermia   . Prostate cancer ([place]) 08/08/2007   Seed Implant and XRT  . Spondylolisthesis    L5-S1    Past Surgical History:  Procedure Laterality Date  . APPENDECTOMY  1940  . BACK SURGERY  1979  . CATARACT EXTRACTION  2007  . CORNEAL TRANSPLANT  2007  . KNEE SURGERY  1996   left knee arthoscopic  . ROTATOR CUFF REPAIR  2015  . Torn meniscus left knee  1996  . TRANSURETHRAL RESECTION OF PROSTATE  1997    There were no vitals filed for this visit.  Subjective Assessment - 09/03/17 1306    Subjective  Patietn reports being up for 24 hours with his brother who went to the hospital this weekend. Patient feels the exercise have helped, however he doesn't like standing on 1 leg becuase it makes his lateral leg ache.    Currently in Pain?  Yes    Pain Score  3     Pain Location  Back    Pain Orientation  Right;Lower    Pain Type  Chronic pain    Pain Onset  More than a month ago                      [facility] Adult PT Treatment/Exercise - 09/03/17 0001      Lumbar Exercises: Stretches   Active Hamstring Stretch  2 reps;30 seconds bilateral   bilateral   Active Hamstring Stretch Limitations  supine with rope    Double Knee to Chest Stretch  4 reps;30 seconds hands behind knees   hands behind knees   Piriformis Stretch Limitations  Bil (supine) 2 reps, 30 seconds      Lumbar Exercises: Standing   Forward Lunge  10 reps RLE forward/LLE forward   RLE forward/LLE forward   Forward Lunge Limitations  with lateral hip excursions    Other Standing Lumbar Exercises  3D hip excursions 10 reps    Other Standing Lumbar Exercises  hip internal rotation box walk, 6 reps BLE      Lumbar Exercises: Supine   Other Supine Lumbar Exercises  frog stretch 3x30 seconds              PT  Education - 09/03/17 1339    Education provided  No       PT Short Term Goals - 08/27/17 1409      PT SHORT TERM GOAL #1   Title  Patient to demonstrate bilateral hamstrings and piriformis muscle groups as being no more than 20% limited in order to improve mechanics during functional task performance     Baseline  10/30- HS have met goal, hips remain tight     Time  3    Period  Weeks    Status  Partially Met      PT SHORT TERM GOAL #2   Title  Patient to be compliant with correct performance of appropriate HEP, to be updated PRN     Baseline  10/30- mostly compliant     Time  1    Period  Weeks    Status  Partially Met        PT Long Term Goals - 08/27/17 1410      PT LONG TERM GOAL #1   Title  Patient to demonstrate MMT as being 5/5 in all tested groups in order to assist in improving functional lifting and mechanics     Baseline  10/30- fairly strong     Time  6    Period  Weeks    Status  Partially Met      PT LONG TERM GOAL #2   Title  Patient to experience low back and R hip pain as being no more than 1/10 with  repetitive functional lifting with 20# in order to show improved functional status     Baseline  10/30- reports this would prorbably be ok     Time  6    Period  Weeks    Status  On-going      PT LONG TERM GOAL #3   Title  Patient to be able to perform 30 repetitions of functional floor to shoulder height lifting with load of 20# and correct functional mechanics, low back and R hip pain being no more than 1/10, in order to show improved functional performance     Baseline  10/30- correct mechanics     Time  6    Period  Weeks    Status  Partially Met            Plan - 09/03/17 1340    Clinical Impression Statement  Continued focus on lumbar and hip mobility this session, attempting to progress and add new activities as tolerated this session. Avoided prone press-ups today due to patient complaints of increased pain. Patient fatigued in general today due to being up most of the night secondary to family matter.     Rehab Potential  Good    Clinical Impairments Affecting Rehab Potential  (+) motivated, active lifestyle; (-) regular repetitive lifting, history back surgery, chronicity of pain     PT Frequency  2x / week    PT Duration  3 weeks    PT Treatment/Interventions  ADLs/Self Care Home Management;Cryotherapy;Moist Heat;DME Instruction;Gait training;Stair training;Functional mobility training;Therapeutic activities;Therapeutic exercise;Balance training;Neuromuscular re-education;Patient/family education;Manual techniques;Passive range of motion;Dry needling;Energy conservation;Taping    PT Next Visit Plan  Continue to improve lumbar and hip mobility.  Next session begin trial hip IR box walks. Update HEP next session    PT Home Exercise Plan  Eval: SKTC, lumbar rotations, HS stretch, piriformis stretch, lumbar towel roll; SLS and squat    Consulted and Agree with Plan of Care  Patient  Patient will benefit from skilled therapeutic intervention in order to improve the  following deficits and impairments:  Improper body mechanics, Pain, Decreased mobility, Postural dysfunction, Decreased range of motion, Decreased strength, Hypomobility, Impaired flexibility  Visit Diagnosis: Chronic bilateral low back pain with bilateral sciatica  Abnormal posture  Other symptoms and signs involving the musculoskeletal system     Problem List Patient Active Problem List   Diagnosis Date Noted  . Prediabetes 08/19/2017  . Subclinical hypothyroidism 08/19/2017  . Adrenal nodule ([place]) 08/19/2017  . Hypoglycemia 08/14/2017  . Spondylolisthesis at L5-S1 level 12/13/2015  . Midline low back pain without sciatica 12/13/2015  . Pain in joint, shoulder region 03/23/2014  . Muscle weakness (generalized) 03/23/2014  . Decreased range of motion of left shoulder 03/23/2014  . Prostate cancer ([place]) 10/31/2011  . Spondylolisthesis   . BPH (benign prostatic hyperplasia)   . Malignant hyperthermia   . Adrenal adenoma     Deniece Ree PT, DPT [place] [address] [place], [place], [address] Phone: [phone]   Fax:  [phone]  Name: Tracy Pratt MRN: [id number] Date of Birth: [birth year]

## 2017-09-05 ENCOUNTER — Ambulatory Visit (HOSPITAL_COMMUNITY): Payer: Medicare Other

## 2017-09-05 ENCOUNTER — Encounter (HOSPITAL_COMMUNITY): Payer: Self-pay

## 2017-09-05 DIAGNOSIS — R29898 Other symptoms and signs involving the musculoskeletal system: Secondary | ICD-10-CM

## 2017-09-05 DIAGNOSIS — M5442 Lumbago with sciatica, left side: Secondary | ICD-10-CM | POA: Diagnosis not present

## 2017-09-05 DIAGNOSIS — M5441 Lumbago with sciatica, right side: Secondary | ICD-10-CM | POA: Diagnosis not present

## 2017-09-05 DIAGNOSIS — G8929 Other chronic pain: Secondary | ICD-10-CM

## 2017-09-05 DIAGNOSIS — R1013 Epigastric pain: Secondary | ICD-10-CM | POA: Diagnosis not present

## 2017-09-05 DIAGNOSIS — R293 Abnormal posture: Secondary | ICD-10-CM

## 2017-09-05 NOTE — Therapy (Signed)
[place], [place], [address] Phone: [phone]   Fax:  [phone]  Physical Therapy Treatment  Patient Details  Name: Tracy Pratt MRN: [id number] Date of Birth: 1931-03-11 Referring Provider: Martie Round    Encounter Date: 09/05/2017  PT End of Session - 09/05/17 1315    Visit Number  9    Number of Visits  13    Date for PT Re-Evaluation  09/17/17    Authorization Type  Medicare/[employer]na Senior Supplement     Authorization Time Period  08/07/17 to 09/18/17    Authorization - Visit Number  9    Authorization - Number of Visits  16    PT Start Time  6789    PT Stop Time  1346    PT Time Calculation (min)  38 min    Activity Tolerance  Patient tolerated treatment well;No increased pain    Behavior During Therapy  WFL for tasks assessed/performed       Past Medical History:  Diagnosis Date  . Adrenal adenoma    Left  . Arthritis   . BPH (benign prostatic hyperplasia)   . Cataract 2007, 2009   Bilateral  . Essential hypertension   . Malignant hyperthermia   . Prostate cancer ([place]) 08/08/2007   Seed Implant and XRT  . Spondylolisthesis    L5-S1    Past Surgical History:  Procedure Laterality Date  . APPENDECTOMY  1940  . BACK SURGERY  1979  . CATARACT EXTRACTION  2007  . CORNEAL TRANSPLANT  2007  . KNEE SURGERY  1996   left knee arthoscopic  . ROTATOR CUFF REPAIR  2015  . Torn meniscus left knee  1996  . TRANSURETHRAL RESECTION OF PROSTATE  1997    There were no vitals filed for this visit.  Subjective Assessment - 09/05/17 1311    Subjective  Pt reports increased pain Bil Lt >Rt knee following last session feels related to the IR exericses.  Reports LBP minimal pain, Lt knee pain 4/10,      Patient Stated Goals  feel better     Currently in Pain?  Yes    Pain Score  1     Pain Location  Back    Pain Orientation  Right;Lower    Pain Descriptors / Indicators  Dull;Aching    Pain Type   Chronic pain    Pain Onset  More than a month ago    Pain Frequency  Constant    Aggravating Factors   nothing    Pain Relieving Factors  nothing    Effect of Pain on Daily Activities  has stopped doing things he usually does    Multiple Pain Sites  Yes    Pain Score  4    Pain Location  Knee    Pain Orientation  Left    Pain Descriptors / Indicators  Aching    Pain Type  Acute pain    Pain Onset  1 to 4 weeks ago    Pain Frequency  Intermittent    Aggravating Factors   Hip IR rotation                      [facility] Adult PT Treatment/Exercise - 09/05/17 0001      Lumbar Exercises: Stretches   Active Hamstring Stretch  2 reps;30 seconds    Active Hamstring Stretch Limitations  supine with rope    Lower Trunk Rotation  10 seconds    Lower Trunk Rotation Limitations  10x 10" holds    Piriformis Stretch  2 reps;30 seconds    Piriformis Stretch Limitations  Bil (supine) 2 reps, 30 seconds      Lumbar Exercises: Standing   Functional Squats  10 reps    Functional Squats Limitations  3D hip excursion (avoided extension)    Lifting  10 reps;From floor    Lifting Limitations  improved mechanics, able to verbalize appropriate form    Other Standing Lumbar Exercises  3D hip excursions 10 reps               PT Short Term Goals - 08/27/17 1409      PT SHORT TERM GOAL #1   Title  Patient to demonstrate bilateral hamstrings and piriformis muscle groups as being no more than 20% limited in order to improve mechanics during functional task performance     Baseline  10/30- HS have met goal, hips remain tight     Time  3    Period  Weeks    Status  Partially Met      PT SHORT TERM GOAL #2   Title  Patient to be compliant with correct performance of appropriate HEP, to be updated PRN     Baseline  10/30- mostly compliant     Time  1    Period  Weeks    Status  Partially Met        PT Long Term Goals - 08/27/17 1410      PT LONG TERM GOAL #1   Title  Patient  to demonstrate MMT as being 5/5 in all tested groups in order to assist in improving functional lifting and mechanics     Baseline  10/30- fairly strong     Time  6    Period  Weeks    Status  Partially Met      PT LONG TERM GOAL #2   Title  Patient to experience low back and R hip pain as being no more than 1/10 with repetitive functional lifting with 20# in order to show improved functional status     Baseline  10/30- reports this would prorbably be ok     Time  6    Period  Weeks    Status  On-going      PT LONG TERM GOAL #3   Title  Patient to be able to perform 30 repetitions of functional floor to shoulder height lifting with load of 20# and correct functional mechanics, low back and R hip pain being no more than 1/10, in order to show improved functional performance     Baseline  10/30- correct mechanics     Time  6    Period  Weeks    Status  Partially Met            Plan - 09/05/17 1319    Clinical Impression Statement  Pt had meeting wtih supervisor initially this session discussing increased knee pain with new exercise addressing hip IR last session.  Held IR walk around this session.  Session focus on functional strenghtening and mobility all in pain free range.  Pt able to demonstrate appropriate mechanics with proper lifting.  No reports of LBP at EOS, rated medial knee pain scale reduced to 2/10.    Rehab Potential  Good    Clinical Impairments Affecting Rehab Potential  (+) motivated, active lifestyle; (-) regular repetitive lifting, history back surgery, chronicity of  pain     PT Frequency  2x / week    PT Duration  3 weeks    PT Treatment/Interventions  ADLs/Self Care Home Management;Cryotherapy;Moist Heat;DME Instruction;Gait training;Stair training;Functional mobility training;Therapeutic activities;Therapeutic exercise;Balance training;Neuromuscular re-education;Patient/family education;Manual techniques;Passive range of motion;Dry needling;Energy  conservation;Taping    PT Next Visit Plan  Continue to improve lumbar and hip mobility.  Next session resume postural strengthening and update HEP with theraband/postural strengthening if good form.    PT Home Exercise Plan  Eval: SKTC, lumbar rotations, HS stretch, piriformis stretch, lumbar towel roll; SLS and squat       Patient will benefit from skilled therapeutic intervention in order to improve the following deficits and impairments:  Improper body mechanics, Pain, Decreased mobility, Postural dysfunction, Decreased range of motion, Decreased strength, Hypomobility, Impaired flexibility  Visit Diagnosis: Chronic bilateral low back pain with bilateral sciatica  Abnormal posture  Other symptoms and signs involving the musculoskeletal system     Problem List Patient Active Problem List   Diagnosis Date Noted  . Prediabetes 08/19/2017  . Subclinical hypothyroidism 08/19/2017  . Adrenal nodule ([place]) 08/19/2017  . Hypoglycemia 08/14/2017  . Spondylolisthesis at L5-S1 level 12/13/2015  . Midline low back pain without sciatica 12/13/2015  . Pain in joint, shoulder region 03/23/2014  . Muscle weakness (generalized) 03/23/2014  . Decreased range of motion of left shoulder 03/23/2014  . Prostate cancer ([place]) 10/31/2011  . Spondylolisthesis   . BPH (benign prostatic hyperplasia)   . Malignant hyperthermia   . Adrenal adenoma    Ihor Austin, LPTA; CBIS [phone]  Aldona Lento 09/05/2017, 1:50 PM  [facility] [address] [place], [place], [address] Phone: [phone]   Fax:  [phone]  Name: Tracy Pratt MRN: [id number] Date of Birth: Mar 25, 1931

## 2017-09-06 ENCOUNTER — Telehealth: Payer: Self-pay | Admitting: Gastroenterology

## 2017-09-06 LAB — H. PYLORI BREATH TEST: H. pylori Breath Test: DETECTED — AB

## 2017-09-06 MED ORDER — AMOXICILLIN 500 MG PO TABS
ORAL_TABLET | ORAL | 0 refills | Status: DC
Start: 2017-09-06 — End: 2018-07-29

## 2017-09-06 MED ORDER — CLARITHROMYCIN 500 MG PO TABS: ORAL_TABLET | ORAL | 0 refills | Status: DC

## 2017-09-06 MED ORDER — PANTOPRAZOLE SODIUM 40 MG PO TBEC: DELAYED_RELEASE_TABLET | ORAL | 2 refills | Status: DC

## 2017-09-06 NOTE — Telephone Encounter (Signed)
Called patient TO DISCUSS RESULTS. HIS BREATH TEST. HE needs AMOXICILLIN 500 mg 2 po BID for 10 days and Biaxin 500 mg po bid for 10 days. She needs PROTONIX 40 MG mg BID for 3 mos. Med side effects include NAUSEA, VOMITING, DIARRHEA, ABDOMINAL pain, and metallic taste.  ALSO CALLED PT'S DAUGHTER TO DISCUSS.

## 2017-09-09 NOTE — Telephone Encounter (Signed)
CC'ED TO PCP 

## 2017-09-10 ENCOUNTER — Ambulatory Visit (HOSPITAL_COMMUNITY): Payer: Medicare Other

## 2017-09-10 ENCOUNTER — Encounter (HOSPITAL_COMMUNITY): Payer: Self-pay

## 2017-09-10 DIAGNOSIS — R293 Abnormal posture: Secondary | ICD-10-CM | POA: Diagnosis not present

## 2017-09-10 DIAGNOSIS — R29898 Other symptoms and signs involving the musculoskeletal system: Secondary | ICD-10-CM | POA: Diagnosis not present

## 2017-09-10 DIAGNOSIS — M5441 Lumbago with sciatica, right side: Principal | ICD-10-CM

## 2017-09-10 DIAGNOSIS — M5442 Lumbago with sciatica, left side: Principal | ICD-10-CM

## 2017-09-10 DIAGNOSIS — G8929 Other chronic pain: Secondary | ICD-10-CM | POA: Diagnosis not present

## 2017-09-10 NOTE — Therapy (Signed)
[place], [place], [address] Phone: [phone]   Fax:  [phone]  Physical Therapy Treatment  Patient Details  Name: Tracy Pratt MRN: [id number] Date of Birth: Apr 09, 1931 Referring Provider: Martie Round    Encounter Date: 09/10/2017  PT End of Session - 09/10/17 1354    Visit Number  10    Number of Visits  13    Date for PT Re-Evaluation  09/17/17    Authorization Type  Medicare/Aetna Senior Supplement     Authorization Time Period  08/07/17 to 09/18/17    Authorization - Visit Number  10    Authorization - Number of Visits  16    PT Start Time  1344    PT Stop Time  1432    PT Time Calculation (min)  48 min    Activity Tolerance  Patient tolerated treatment well;No increased pain    Behavior During Therapy  WFL for tasks assessed/performed       Past Medical History:  Diagnosis Date  . Adrenal adenoma    Left  . Arthritis   . BPH (benign prostatic hyperplasia)   . Cataract 2007, 2009   Bilateral  . Essential hypertension   . Malignant hyperthermia   . Prostate cancer ([place]) 08/08/2007   Seed Implant and XRT  . Spondylolisthesis    L5-S1    Past Surgical History:  Procedure Laterality Date  . APPENDECTOMY  1940  . BACK SURGERY  1979  . CATARACT EXTRACTION  2007  . CORNEAL TRANSPLANT  2007  . KNEE SURGERY  1996   left knee arthoscopic  . ROTATOR CUFF REPAIR  2015  . Torn meniscus left knee  1996  . TRANSURETHRAL RESECTION OF PROSTATE  1997    There were no vitals filed for this visit.  Subjective Assessment - 09/10/17 1346    Subjective  Pt stated he has Bil knee and Lt hip pain scale 3/10, LBP feels stiff today, no reports of back pain today.      Patient Stated Goals  feel better     Currently in Pain?  Yes    Pain Score  3     Pain Location  Leg Lt hip and Bil knee    Pain Orientation  Right;Left    Pain Descriptors / Indicators  Aching;Sore    Pain Type  Chronic pain     Pain Onset  More than a month ago    Pain Frequency  Constant    Aggravating Factors   nothing    Pain Relieving Factors  nothing    Effect of Pain on Daily Activities  has stopped doing things he usually does                      [facility] Adult PT Treatment/Exercise - 09/10/17 0001      Lumbar Exercises: Stretches   Active Hamstring Stretch  2 reps;30 seconds    Active Hamstring Stretch Limitations  supine with rope    Single Knee to Chest Stretch  2 reps;30 seconds    Lower Trunk Rotation  10 seconds    Lower Trunk Rotation Limitations  10x 10" holds    Piriformis Stretch  2 reps;30 seconds    Piriformis Stretch Limitations  Bil (supine) 2 reps, 30 seconds      Lumbar Exercises: Standing   Functional Squats  10 reps    Functional Squats Limitations  3D hip excursion (  avoided extension)    Lifting  10 reps;From floor    Other Standing Lumbar Exercises  3D hip excursions 10 reps      Lumbar Exercises: Quadruped   Madcat/Old Horse  5 reps mad cat to neutral    Single Arm Raise  5 reps    Straight Leg Raise  5 reps    Other Quadruped Lumbar Exercises  child's pose x 30"      Manual Therapy   Manual Therapy  Soft tissue mobilization    Manual therapy comments  Manual therapy completed prior to exercises.    Soft tissue mobilization  Lt piriformis in Rt sidelying x 5 min               PT Short Term Goals - 08/27/17 1409      PT SHORT TERM GOAL #1   Title  Patient to demonstrate bilateral hamstrings and piriformis muscle groups as being no more than 20% limited in order to improve mechanics during functional task performance     Baseline  10/30- HS have met goal, hips remain tight     Time  3    Period  Weeks    Status  Partially Met      PT SHORT TERM GOAL #2   Title  Patient to be compliant with correct performance of appropriate HEP, to be updated PRN     Baseline  10/30- mostly compliant     Time  1    Period  Weeks    Status  Partially Met         PT Long Term Goals - 08/27/17 1410      PT LONG TERM GOAL #1   Title  Patient to demonstrate MMT as being 5/5 in all tested groups in order to assist in improving functional lifting and mechanics     Baseline  10/30- fairly strong     Time  6    Period  Weeks    Status  Partially Met      PT LONG TERM GOAL #2   Title  Patient to experience low back and R hip pain as being no more than 1/10 with repetitive functional lifting with 20# in order to show improved functional status     Baseline  10/30- reports this would prorbably be ok     Time  6    Period  Weeks    Status  On-going      PT LONG TERM GOAL #3   Title  Patient to be able to perform 30 repetitions of functional floor to shoulder height lifting with load of 20# and correct functional mechanics, low back and R hip pain being no more than 1/10, in order to show improved functional performance     Baseline  10/30- correct mechanics     Time  6    Period  Weeks    Status  Partially Met            Plan - 09/10/17 1400    Clinical Impression Statement  Added stretches to improve spinal mobiltiy to address stiffness.  Therex focus on proximal and postural strengtheing.  Visual, verbal and tactile cueing to improve mechanics with squats and new exercises for proper form.  No reports of increased pain through session, did c/o Rt knee pain wtih some exercises (SKTC, LTR  to Lt side).  Did encourage pt to speak if any movement hurts.      Rehab Potential  Good  Clinical Impairments Affecting Rehab Potential  (+) motivated, active lifestyle; (-) regular repetitive lifting, history back surgery, chronicity of pain     PT Frequency  2x / week    PT Duration  3 weeks    PT Treatment/Interventions  ADLs/Self Care Home Management;Cryotherapy;Moist Heat;DME Instruction;Gait training;Stair training;Functional mobility training;Therapeutic activities;Therapeutic exercise;Balance training;Neuromuscular re-education;Patient/family  education;Manual techniques;Passive range of motion;Dry needling;Energy conservation;Taping    PT Next Visit Plan  Continue to improve lumbar and hip mobility.   Next session progress core strenghtening with tandem stance palvo with theraband.    PT Home Exercise Plan  Eval: SKTC, lumbar rotations, HS stretch, piriformis stretch, lumbar towel roll; SLS and squat; theraband postural strenghtening.       Patient will benefit from skilled therapeutic intervention in order to improve the following deficits and impairments:  Improper body mechanics, Pain, Decreased mobility, Postural dysfunction, Decreased range of motion, Decreased strength, Hypomobility, Impaired flexibility  Visit Diagnosis: Chronic bilateral low back pain with bilateral sciatica  Abnormal posture  Other symptoms and signs involving the musculoskeletal system     Problem List Patient Active Problem List   Diagnosis Date Noted  . Prediabetes 08/19/2017  . Subclinical hypothyroidism 08/19/2017  . Adrenal nodule ([place]) 08/19/2017  . Hypoglycemia 08/14/2017  . Spondylolisthesis at L5-S1 level 12/13/2015  . Midline low back pain without sciatica 12/13/2015  . Pain in joint, shoulder region 03/23/2014  . Muscle weakness (generalized) 03/23/2014  . Decreased range of motion of left shoulder 03/23/2014  . Prostate cancer ([place]) 10/31/2011  . Spondylolisthesis   . BPH (benign prostatic hyperplasia)   . Malignant hyperthermia   . Adrenal adenoma    Ihor Austin, LPTA; [place]  Aldona Lento 09/10/2017, 2:44 PM  [facility], [place], [address] Phone: [phone]   Fax:  [phone]  Name: Tracy Pratt MRN: [id number] Date of Birth: 1931/09/17

## 2017-09-12 ENCOUNTER — Other Ambulatory Visit: Payer: Self-pay

## 2017-09-12 ENCOUNTER — Encounter (HOSPITAL_COMMUNITY): Payer: Self-pay | Admitting: Physical Therapy

## 2017-09-12 ENCOUNTER — Ambulatory Visit (HOSPITAL_COMMUNITY): Payer: Medicare Other | Admitting: Physical Therapy

## 2017-09-12 DIAGNOSIS — G8929 Other chronic pain: Secondary | ICD-10-CM

## 2017-09-12 DIAGNOSIS — M5441 Lumbago with sciatica, right side: Secondary | ICD-10-CM | POA: Diagnosis not present

## 2017-09-12 DIAGNOSIS — R29898 Other symptoms and signs involving the musculoskeletal system: Secondary | ICD-10-CM

## 2017-09-12 DIAGNOSIS — R293 Abnormal posture: Secondary | ICD-10-CM | POA: Diagnosis not present

## 2017-09-12 DIAGNOSIS — M5442 Lumbago with sciatica, left side: Principal | ICD-10-CM

## 2017-09-12 NOTE — Patient Instructions (Signed)
   BRIDGING  While lying on your back with knees bent, tighten your lower abdominals, squeeze your buttocks and then raise your buttocks off the floor/bed as creating a "Bridge" with your body. Hold and then lower yourself and repeat.     LOWER TRUNK ROTATIONS - LTR  Lying on your back with your knees bent, gently move your knees side-to-side.     SINGLE KNEE TO CHEST STRETCH - [place]  While Lying on your back, hold your knee and gently pull it up towards your chest.      WALL SQUATS  Leaning up against a wall or closed door on your back, slide your body downward and then return back to upright position.  A door was used here because it was smoother and had less friction than the wall.   Knees should bend in line with the 2nd toe and not pass the front of the foot.

## 2017-09-12 NOTE — Therapy (Signed)
[place], [place], [address] Phone: [phone]   Fax:  [phone]  Physical Therapy Treatment  Patient Details  Name: Tracy Pratt MRN: [id number] Date of Birth: [birth year] Referring Provider: Martie Round    Encounter Date: 09/12/2017  PT End of Session - 09/12/17 1820    Visit Number  11    Number of Visits  13    Date for PT Re-Evaluation  09/17/17    Authorization Type  Medicare/[employer] Senior Supplement     Authorization Time Period  08/07/17 to 09/18/17    Authorization - Visit Number  11    Authorization - Number of Visits  16    PT Start Time  5625    PT Stop Time  1430    PT Time Calculation (min)  44 min    Activity Tolerance  Patient tolerated treatment well;No increased pain    Behavior During Therapy  WFL for tasks assessed/performed       Past Medical History:  Diagnosis Date  . Adrenal adenoma    Left  . Arthritis   . BPH (benign prostatic hyperplasia)   . Cataract 2007, 2009   Bilateral  . Essential hypertension   . Malignant hyperthermia   . Prostate cancer ([place]) 08/08/2007   Seed Implant and XRT  . Spondylolisthesis    L5-S1    Past Surgical History:  Procedure Laterality Date  . APPENDECTOMY  1940  . BACK SURGERY  1979  . CATARACT EXTRACTION  2007  . CORNEAL TRANSPLANT  2007  . KNEE SURGERY  1996   left knee arthoscopic  . ROTATOR CUFF REPAIR  2015  . Torn meniscus left knee  1996  . TRANSURETHRAL RESECTION OF PROSTATE  1997    There were no vitals filed for this visit.  Subjective Assessment - 09/12/17 1352    Subjective  Patient has been feeling good and his back has not been bothering him for last few days. His left hip and both knees have been bothering him recently. He states he has been taking vicoden for last 3 days; it was prescribed for his back and he is taking it for his knees currently. Therapists recommended he speaks with his MD reguarding knee pain.    How long can you sit comfortably?  10/30- unlimited but standing is more tolerable, his HS muscles go to sleep when seated     How long can you stand comfortably?  10/30- unsure     How long can you walk comfortably?  10/30- unsure     Patient Stated Goals  feel better     Currently in Pain?  Yes    Pain Score  -- patient would not rate with number and stated "it's bad"    Pain Location  Knee    Pain Orientation  Right;Left    Pain Descriptors / Indicators  Aching;Dull;Sore    Pain Type  Other (Comment) started after doing exercises    Pain Onset  More than a month ago    Pain Frequency  Constant    Aggravating Factors   some of my exercises    Pain Relieving Factors  pain medicine    Pain Onset  --        [facility] Adult PT Treatment/Exercise - 09/12/17 0001      Lumbar Exercises: Stretches   Single Knee to Chest Stretch  10 seconds;Limitations    Single Knee to Chest Stretch Limitations  10 reps BLE    Lower Trunk Rotation  10 seconds    Lower Trunk Rotation Limitations  10x 10" holds      Lumbar Exercises: Standing   Wall Slides  10 reps;Limitations    Wall Slides Limitations  mini-squat       Lumbar Exercises: Supine   Bridge  10 reps;3 seconds    Other Supine Lumbar Exercises  windshield wipers for lumbar spine opener, 10 seconds holds, 10x each side; SKTC 10 x each side 10 second holds      Lumbar Exercises: Quadruped   Straight Leg Raise  10 reps;Limitations    Straight Leg Raises Limitations  BLE        PT Education - 09/12/17 1819    Education provided  Yes    Education Details  Educated on new HEP exercises and on benefits of therapy, educated on trial of exercises for patient in order to tailor therapy to him sepcifically. Educated on safe and proper form for exercises.    Person(s) Educated  Patient    Methods  Explanation    Comprehension  Verbalized understanding       PT Short Term Goals - 08/27/17 1409      PT SHORT TERM GOAL #1   Title  Patient to  demonstrate bilateral hamstrings and piriformis muscle groups as being no more than 20% limited in order to improve mechanics during functional task performance     Baseline  10/30- HS have met goal, hips remain tight     Time  3    Period  Weeks    Status  Partially Met      PT SHORT TERM GOAL #2   Title  Patient to be compliant with correct performance of appropriate HEP, to be updated PRN     Baseline  10/30- mostly compliant     Time  1    Period  Weeks    Status  Partially Met        PT Long Term Goals - 08/27/17 1410      PT LONG TERM GOAL #1   Title  Patient to demonstrate MMT as being 5/5 in all tested groups in order to assist in improving functional lifting and mechanics     Baseline  10/30- fairly strong     Time  6    Period  Weeks    Status  Partially Met      PT LONG TERM GOAL #2   Title  Patient to experience low back and R hip pain as being no more than 1/10 with repetitive functional lifting with 20# in order to show improved functional status     Baseline  10/30- reports this would prorbably be ok     Time  6    Period  Weeks    Status  On-going      PT LONG TERM GOAL #3   Title  Patient to be able to perform 30 repetitions of functional floor to shoulder height lifting with load of 20# and correct functional mechanics, low back and R hip pain being no more than 1/10, in order to show improved functional performance     Baseline  10/30- correct mechanics     Time  6    Period  Weeks    Status  Partially Met         Plan - 09/12/17 1822    Clinical Impression Statement  Patient has made good progress with low back  pain and has had no pain for the last several days. He is limited primarily by a recent onset of bilateral knee pain. Therapy focused on postrual strengthening for low back as well as lumbar spine opening to alleviated pain. PT discussed potential to discharge next week upon re-assessment next week as he has made great progress with goals.     Rehab Potential  Good    Clinical Impairments Affecting Rehab Potential  (+) motivated, active lifestyle; (-) regular repetitive lifting, history back surgery, chronicity of pain     PT Frequency  2x / week    PT Duration  3 weeks    PT Treatment/Interventions  ADLs/Self Care Home Management;Cryotherapy;Moist Heat;DME Instruction;Gait training;Stair training;Functional mobility training;Therapeutic activities;Therapeutic exercise;Balance training;Neuromuscular re-education;Patient/family education;Manual techniques;Passive range of motion;Dry needling;Energy conservation;Taping    PT Next Visit Plan  Re-assess next session. Continue to improve lumbar and hip mobility.   Next session progress core strenghtening with tandem stance palvo with theraband.    PT Home Exercise Plan  Review all exercises and provided handout per patient request: SKTC, lumbar rotations, HS stretch, piriformis stretch, lumbar towel roll; SLS and squat; theraband postural strenghtenin, bridges, wall squat.    Consulted and Agree with Plan of Care  Patient       Patient will benefit from skilled therapeutic intervention in order to improve the following deficits and impairments:  Improper body mechanics, Pain, Decreased mobility, Postural dysfunction, Decreased range of motion, Decreased strength, Hypomobility, Impaired flexibility  Visit Diagnosis: Chronic bilateral low back pain with bilateral sciatica  Abnormal posture  Other symptoms and signs involving the musculoskeletal system     Problem List Patient Active Problem List   Diagnosis Date Noted  . Prediabetes 08/19/2017  . Subclinical hypothyroidism 08/19/2017  . Adrenal nodule ([place]) 08/19/2017  . Hypoglycemia 08/14/2017  . Spondylolisthesis at L5-S1 level 12/13/2015  . Midline low back pain without sciatica 12/13/2015  . Pain in joint, shoulder region 03/23/2014  . Muscle weakness (generalized) 03/23/2014  . Decreased range of motion of left shoulder  03/23/2014  . Prostate cancer ([place]) 10/31/2011  . Spondylolisthesis   . BPH (benign prostatic hyperplasia)   . Malignant hyperthermia   . Adrenal adenoma     Debara Pickett, PT, DPT Physical Therapist with [facility]  09/12/2017 6:30 PM    [place], [place], [address] Phone: [phone]   Fax:  [phone]  Name: Tracy Pratt MRN: [id number] Date of Birth: [birth year]

## 2017-09-17 ENCOUNTER — Ambulatory Visit (HOSPITAL_COMMUNITY): Payer: Medicare Other | Admitting: Physical Therapy

## 2017-09-17 ENCOUNTER — Telehealth (HOSPITAL_COMMUNITY): Payer: Self-pay | Admitting: Physical Therapy

## 2017-09-17 NOTE — Telephone Encounter (Signed)
No-show; called and left message on voicemail detailing today's no-show. Also stated that since potential DC had been discussed last session, we will plan to discharge from PT if we do not hear from him by the end of day tomorrow.   Deniece Ree PT, DPT, CBIS  Supplemental Physical Therapist [facility]

## 2017-09-23 ENCOUNTER — Encounter (HOSPITAL_COMMUNITY): Payer: Self-pay | Admitting: Physical Therapy

## 2017-09-23 ENCOUNTER — Ambulatory Visit (HOSPITAL_COMMUNITY): Payer: Medicare Other | Admitting: Physical Therapy

## 2017-09-23 DIAGNOSIS — M5442 Lumbago with sciatica, left side: Principal | ICD-10-CM

## 2017-09-23 DIAGNOSIS — M5441 Lumbago with sciatica, right side: Secondary | ICD-10-CM | POA: Diagnosis not present

## 2017-09-23 DIAGNOSIS — G8929 Other chronic pain: Secondary | ICD-10-CM

## 2017-09-23 DIAGNOSIS — R293 Abnormal posture: Secondary | ICD-10-CM

## 2017-09-23 DIAGNOSIS — R29898 Other symptoms and signs involving the musculoskeletal system: Secondary | ICD-10-CM | POA: Diagnosis not present

## 2017-09-23 NOTE — Therapy (Signed)
[facility] [address] [place], [place], [address] Phone: [phone]   Fax:  [phone]  Physical Therapy Treatment (Discharge)  Patient Details  Name: Tracy Pratt MRN: [id number] Date of Birth: June 19, 1931 Referring Provider: Martie Round   PHYSICAL THERAPY DISCHARGE SUMMARY  Visits from Start of Care: 12  Current functional level related to goals / functional outcomes: See below    Remaining deficits: See below    Education / Equipment: See below  Plan: Patient agrees to discharge.  Patient goals were partially met. Patient is being discharged due to lack of progress.  ?????       Encounter Date: 09/23/2017  PT End of Session - 09/23/17 0848    Visit Number  12    Number of Visits  12    Date for PT Re-Evaluation  09/17/17    Authorization Type  Medicare/[employer]na Senior Supplement     Authorization Time Period  08/07/17 to 28/36/62; recert done 94/76    Authorization - Visit Number  12    Authorization - Number of Visits  16    PT Start Time  0817    PT Stop Time  0842    PT Time Calculation (min)  25 min    Activity Tolerance  Patient tolerated treatment well    Behavior During Therapy  [facility] for tasks assessed/performed       Past Medical History:  Diagnosis Date  . Adrenal adenoma    Left  . Arthritis   . BPH (benign prostatic hyperplasia)   . Cataract 2007, 2009   Bilateral  . Essential hypertension   . Malignant hyperthermia   . Prostate cancer ([place]) 08/08/2007   Seed Implant and XRT  . Spondylolisthesis    L5-S1    Past Surgical History:  Procedure Laterality Date  . APPENDECTOMY  1940  . BACK SURGERY  1979  . CATARACT EXTRACTION  2007  . CORNEAL TRANSPLANT  2007  . KNEE SURGERY  1996   left knee arthoscopic  . ROTATOR CUFF REPAIR  2015  . Torn meniscus left knee  1996  . TRANSURETHRAL RESECTION OF PROSTATE  1997    There were no vitals filed for this visit.  Subjective Assessment -  09/23/17 0818    Subjective  Patient arrives stating that his back is feeling good, his back is not bothering him nay more, it is more his knees and his hip now. He states his big issue is getting jup and down.     How long can you sit comfortably?  11/26- unlimited     How long can you stand comfortably?  11/26- unlimited     How long can you walk comfortably?  11/26- not sure, feels it might be unlimited     Patient Stated Goals  feel better     Currently in Pain?  Yes    Pain Score  5     Pain Location  Other (Comment) B knees, L hip     Pain Orientation  Right;Left    Pain Descriptors / Indicators  Throbbing;Discomfort    Pain Type  Other (Comment) started after doing exercises     Pain Radiating Towards  none     Pain Onset  More than a month ago    Pain Frequency  Constant    Aggravating Factors   some of exercises     Pain Relieving Factors  pain medicine     Effect of Pain  on Daily Activities  unsure          [facility] PT Assessment - 09/23/17 0001      Assessment   Medical Diagnosis  LBP with scitica     Referring Provider  Elta Guadeloupe Pane     Onset Date/Surgical Date  -- 4 months ago     Next MD Visit  Ortho MD tomorrow     Prior Therapy  over 10 years ago       Precautions   Precautions  None      Restrictions   Weight Bearing Restrictions  No      Balance Screen   Has the patient fallen in the past 6 months  No    Has the patient had a decrease in activity level because of a fear of falling?   No    Is the patient reluctant to leave their home because of a fear of falling?   No      Prior Function   Level of Independence  Independent;Independent with basic ADLs;Independent with gait;Independent with transfers    Vocation  Retired      Art therapist   Posture/Postural Control  Postural limitations    Postural Limitations  Rounded Shoulders;Forward head      AROM   Lumbar Flexion  WNL     Lumbar Extension  WNL     Lumbar - Right Side Bend  WNL      Lumbar - Left Side Bend  moderate limitation       Strength   Right Hip Flexion  5/5    Right Hip Extension  5/5    Right Hip ABduction  5/5    Left Hip Flexion  5/5    Left Hip Extension  5/5    Left Hip ABduction  5/5    Right Knee Flexion  5/5    Right Knee Extension  5/5    Left Knee Flexion  5/5    Left Knee Extension  5/5    Right Ankle Dorsiflexion  5/5    Left Ankle Dorsiflexion  5/5      Flexibility   Hamstrings  WNL     Piriformis  mild limitation R, moderate limitation L                           PT Education - 09/23/17 0847    Education provided  Yes    Education Details  outcomes of objective measures, goal review; patient is on right track to get knees/hip checked with MD, can return to PT for hip/knee pain with new MD order; DC from PT for back today     Person(s) Educated  Patient    Methods  Explanation    Comprehension  Verbalized understanding       PT Short Term Goals - 09/23/17 0834      PT SHORT TERM GOAL #1   Title  Patient to demonstrate bilateral hamstrings and piriformis muscle groups as being no more than 20% limited in order to improve mechanics during functional task performance     Baseline  11/26- improved except for L hip     Time  3    Period  Weeks    Status  Partially Met      PT SHORT TERM GOAL #2   Title  Patient to be compliant with correct performance of appropriate HEP, to be updated PRN     Baseline  11/26-  going well, doing them at least 1x/day     Time  1    Period  Weeks    Status  Achieved        PT Long Term Goals - 09/23/17 3953      PT LONG TERM GOAL #1   Title  Patient to demonstrate MMT as being 5/5 in all tested groups in order to assist in improving functional lifting and mechanics     Baseline  11/26- met     Time  6    Period  Weeks    Status  Achieved      PT LONG TERM GOAL #2   Title  Patient to experience low back and R hip pain as being no more than 1/10 with repetitive functional  lifting with 20# in order to show improved functional status     Baseline  11/26- reports no problem but someone else does most of his lifting     Time  6    Period  Weeks    Status  Partially Met      PT LONG TERM GOAL #3   Title  Patient to be able to perform 30 repetitions of functional floor to shoulder height lifting with load of 20# and correct functional mechanics, low back and R hip pain being no more than 1/10, in order to show improved functional performance     Baseline  11/26- "depends on how high I have to lift it" but probably wouldn't be a problem    Time  6    Period  Weeks    Status  Partially Met            Plan - 09/23/17 0849    Clinical Impression Statement  Re-assessment performed today. Patient appears to have achieved maximal benefit from skilled PT services, and does not show significant improvement or functional gains as compared to objective results from last assessment. Patient reports that his knees and hips have been more of an issue than his back; he reports this pain started after some exercises in PT, however note that patient has required repetitive cues and education on importance of open communication with treating therapist regarding tolerability of exercises/openly communicating about pain during sessions. DC from skilled care for his back today; patient to see MD regarding his hip and knee pain tomorrow and was educated that he can return to PT for these areas with new MD order.     Rehab Potential  Good    Clinical Impairments Affecting Rehab Potential  (+) motivated, active lifestyle; (-) regular repetitive lifting, history back surgery, chronicity of pain     PT Next Visit Plan  DC today     PT Home Exercise Plan  Review all exercises and provided handout per patient request: SKTC, lumbar rotations, HS stretch, piriformis stretch, lumbar towel roll; SLS and squat; theraband postural strenghtenin, bridges, wall squat.       Patient will benefit  from skilled therapeutic intervention in order to improve the following deficits and impairments:  Improper body mechanics, Pain, Decreased mobility, Postural dysfunction, Decreased range of motion, Decreased strength, Hypomobility, Impaired flexibility  Visit Diagnosis: Chronic bilateral low back pain with bilateral sciatica - Plan: PT plan of care cert/re-cert  Abnormal posture - Plan: PT plan of care cert/re-cert  Other symptoms and signs involving the musculoskeletal system - Plan: PT plan of care cert/re-cert   G-Codes - 20/23/34 0850    Functional Assessment Tool Used (Outpatient Only)  Based on skilled clinical assessment of posture, ROM, biomechanics, flexibility     Functional Limitation  Mobility: Walking and moving around    Mobility: Walking and Moving Around Goal Status [phone])  At least 20 percent but less than 40 percent impaired, limited or restricted    Mobility: Walking and Moving Around Discharge Status [phone])  At least 20 percent but less than 40 percent impaired, limited or restricted       Problem List Patient Active Problem List   Diagnosis Date Noted  . Prediabetes 08/19/2017  . Subclinical hypothyroidism 08/19/2017  . Adrenal nodule ([place]) 08/19/2017  . Hypoglycemia 08/14/2017  . Spondylolisthesis at L5-S1 level 12/13/2015  . Midline low back pain without sciatica 12/13/2015  . Pain in joint, shoulder region 03/23/2014  . Muscle weakness (generalized) 03/23/2014  . Decreased range of motion of left shoulder 03/23/2014  . Prostate cancer ([place]) 10/31/2011  . Spondylolisthesis   . BPH (benign prostatic hyperplasia)   . Malignant hyperthermia   . Adrenal adenoma     Deniece Ree PT, DPT, CBIS  Supplemental Physical Therapist [place] [place] [address] [place], [place], [address] Phone: [phone]   Fax:  [phone]  Name: Tracy Pratt MRN: [id number] Date of Birth: 05-Jan-1931

## 2017-09-24 ENCOUNTER — Ambulatory Visit (INDEPENDENT_AMBULATORY_CARE_PROVIDER_SITE_OTHER): Payer: Medicare Other | Admitting: Orthopaedic Surgery

## 2017-09-24 ENCOUNTER — Encounter: Payer: Self-pay | Admitting: Orthopaedic Surgery

## 2017-09-24 VITALS — BP 145/79 | HR 60 | Temp 97.6°F | Ht 69.5 in | Wt 213.0 lb

## 2017-09-24 DIAGNOSIS — I6529 Occlusion and stenosis of unspecified carotid artery: Secondary | ICD-10-CM

## 2017-09-24 DIAGNOSIS — M25561 Pain in right knee: Secondary | ICD-10-CM

## 2017-09-24 DIAGNOSIS — M25562 Pain in left knee: Secondary | ICD-10-CM

## 2017-09-24 DIAGNOSIS — G8929 Other chronic pain: Secondary | ICD-10-CM

## 2017-09-24 NOTE — Progress Notes (Signed)
Patient PN:TIRWER [name] Tracy Pratt, male DOB:1931/01/07, 83 y.o. XVQ:[id number]  Chief Complaint  Patient presents with  . New Problem    Bilateral knee pain left worse than right    HPI  Tracy Pratt is a 83 y.o. male who has developed pain of both knees, more on the left. He has no trauma, no giving way.  He went to PT for his back and his knees started hurting after doing exercises.  He has tried ice, heat, rest and rubs with no help.  He has no giving way. HPI  Body mass index is 31 kg/m.  ROS  Review of Systems  Constitutional:       Patient does not have Diabetes Mellitus. Patient has hypertension. Patient does not have COPD or shortness of breath. Patient does not have BMI > 35. Patient does not have current smoking history.  HENT: Negative for congestion.   Eyes:       Post cataract surgery, doing well   Respiratory: Negative for cough and shortness of breath.   Cardiovascular: Negative for chest pain and leg swelling.       Malignant hyperthermia with anesthesia  Endocrine: Negative for cold intolerance.  Genitourinary:       Post prostate cancer with treatment, doing well.  Musculoskeletal: Positive for arthralgias (prior of knees and shoulders, resolved for now, recurrent) and back pain.  Allergic/Immunologic: Negative for environmental allergies.    Past Medical History:  Diagnosis Date  . Adrenal adenoma    Left  . Arthritis   . BPH (benign prostatic hyperplasia)   . Cataract 2007, 2009   Bilateral  . Essential hypertension   . Malignant hyperthermia   . Prostate cancer ([place]) 08/08/2007   Seed Implant and XRT  . Spondylolisthesis    L5-S1    Past Surgical History:  Procedure Laterality Date  . APPENDECTOMY  1940  . BACK SURGERY  1979  . CATARACT EXTRACTION  2007  . CORNEAL TRANSPLANT  2007  . KNEE SURGERY  1996   left knee arthoscopic  . ROTATOR CUFF REPAIR  2015  . Torn meniscus left knee  1996  . TRANSURETHRAL RESECTION OF PROSTATE  1997     Family History  Problem Relation Age of Onset  . Prostate cancer Brother   . Lung disease Brother        lobectomy    Social History Social History   Tobacco Use  . Smoking status: Former Smoker    Packs/day: 1.00    Years: 15.00    Pack years: 15.00    Types: Cigarettes  . Smokeless tobacco: Never Used  . Tobacco comment: quit 28 years ago  Substance Use Topics  . Alcohol use: No  . Drug use: No    Allergies  Allergen Reactions  . Diclofenac Sodium Anaphylaxis  . Nabumetone Anaphylaxis  . Nsaids Anaphylaxis  . Bee Venom Swelling  . Desflurane     Malignant hyperthermia  . Enflurane     Malignant hyperthermia  . Halothane     Malignant hyperthermia  . Isoflurane     Malignant hyperthermia  . Sevoflurane     Malignant hyperthermia  . Succinylcholine     Malignant hyperthermia    Current Outpatient Medications  Medication Sig Dispense Refill  . amoxicillin (AMOXIL) 500 MG tablet 2 PO BID FOR 10 DAYS 40 tablet 0  . clarithromycin (BIAXIN) 500 MG tablet 1 PO BID FOR 10 DAYS. 20 tablet 0  . furosemide (LASIX) 40  MG tablet Take 40 mg by mouth daily as needed for fluid.     Marland Kitchen HYDROcodone-acetaminophen (NORCO) 7.5-325 MG tablet TAKE ONE TABLET BY MOUTH EVERY 4 HOURS AS NEEDED FOR MODERATE PAIN. MUST LAST 30 DAYS. 120 tablet 0  . lisinopril-hydrochlorothiazide (PRINZIDE,ZESTORETIC) 10-12.5 MG tablet Take 1 tablet by mouth daily.    Marland Kitchen loteprednol (LOTEMAX) 0.5 % ophthalmic suspension Place 1 drop into both eyes at bedtime.     . methocarbamol (ROBAXIN) 500 MG tablet TAKE (1) TABLET BY MOUTH (3) TIMES DAILY. (Patient taking differently: TAKE (1) TABLET BY MOUTH (3) TIMES DAILY AS NEEDED) 60 tablet 0  . pantoprazole (PROTONIX) 40 MG tablet 1 PO 30 MINUTES PRIOR TO MEALS BID FOR 3 MOS 60 tablet 2  . Vitamin D, Ergocalciferol, (DRISDOL) 50000 units CAPS capsule Take 50,000 Units by mouth every 7 (seven) days.     No current facility-administered medications for this  visit.      Physical Exam  Blood pressure (!) 145/79, pulse 60, temperature 97.6 F (36.4 C), height 5' 9.5" (1.765 m), weight 213 lb (96.6 kg).  Constitutional: overall normal hygiene, normal nutrition, well developed, normal grooming, normal body habitus. Assistive device:none  Musculoskeletal: gait and station Limp left, muscle tone and strength are normal, no tremors or atrophy is present.  .  Neurological: coordination overall normal.  Deep tendon reflex/nerve stretch intact.  Sensation normal.  Cranial nerves II-XII intact.   Skin:   Normal overall no scars, lesions, ulcers or rashes. No psoriasis.  Psychiatric: Alert and oriented x 3.  Recent memory intact, remote memory unclear.  Normal mood and affect. Well groomed.  Good eye contact.  Cardiovascular: overall no swelling, no varicosities, no edema bilaterally, normal temperatures of the legs and arms, no clubbing, cyanosis and good capillary refill.  Lymphatic: palpation is normal.  All other systems reviewed and are negative   The bilateral lower extremity is examined:  Inspection:  Thigh:  Non-tender and no defects  Knee has swelling 1/2+ effusion.                        Joint tenderness is present                        Patient is tender over the medial joint line  Lower Leg:  Has normal appearance and no tenderness or defects  Ankle:  Non-tender and no defects  Foot:  Non-tender and no defects Range of Motion:  Knee:  Range of motion is: 0-105 left, 0 to 110 right                        Crepitus is  present  Ankle:  Range of motion is normal. Strength and Tone:  The bilateral lower extremity has normal strength and tone. Stability:  Knee:  The knee is stable.  Ankle:  The ankle is stable.   The patient has been educated about the nature of the problem(s) and counseled on treatment options.  The patient appeared to understand what I have discussed and is in agreement with it.  Encounter Diagnoses  Name  Primary?  . Chronic pain of left knee Yes  . Chronic pain of right knee    PROCEDURE NOTE:  The patient requests injections of the left knee , verbal consent was obtained.  The left knee was prepped appropriately after time out was performed.   Sterile technique was observed  and injection of 1 cc of Depo-Medrol 40 mg with several cc's of plain xylocaine. Anesthesia was provided by ethyl chloride and a 20-gauge needle was used to inject the knee area. The injection was tolerated well.  A band aid dressing was applied.  The patient was advised to apply ice later today and tomorrow to the injection sight as needed.  PROCEDURE NOTE:  The patient requests injections of the right knee , verbal consent was obtained.  The right knee was prepped appropriately after time out was performed.   Sterile technique was observed and injection of 1 cc of Depo-Medrol 40 mg with several cc's of plain xylocaine. Anesthesia was provided by ethyl chloride and a 20-gauge needle was used to inject the knee area. The injection was tolerated well.  A band aid dressing was applied.  The patient was advised to apply ice later today and tomorrow to the injection sight as needed.   PLAN Call if any problems.  Precautions discussed.  Continue current medications.   Return to clinic 2 weeks   Electronically Signed Sanjuana Kava, MD 11/27/201810:37 AM

## 2017-10-02 DIAGNOSIS — H1851 Endothelial corneal dystrophy: Secondary | ICD-10-CM | POA: Diagnosis not present

## 2017-10-09 ENCOUNTER — Encounter: Payer: Self-pay | Admitting: Orthopaedic Surgery

## 2017-10-09 ENCOUNTER — Ambulatory Visit (INDEPENDENT_AMBULATORY_CARE_PROVIDER_SITE_OTHER): Payer: Medicare Other | Admitting: Orthopaedic Surgery

## 2017-10-09 VITALS — BP 180/91 | HR 67 | Ht 69.5 in | Wt 214.0 lb

## 2017-10-09 DIAGNOSIS — M48061 Spinal stenosis, lumbar region without neurogenic claudication: Secondary | ICD-10-CM | POA: Diagnosis not present

## 2017-10-09 DIAGNOSIS — M25562 Pain in left knee: Secondary | ICD-10-CM

## 2017-10-09 DIAGNOSIS — M25561 Pain in right knee: Secondary | ICD-10-CM

## 2017-10-09 DIAGNOSIS — G8929 Other chronic pain: Secondary | ICD-10-CM | POA: Diagnosis not present

## 2017-10-09 DIAGNOSIS — I6529 Occlusion and stenosis of unspecified carotid artery: Secondary | ICD-10-CM | POA: Diagnosis not present

## 2017-10-09 MED ORDER — HYDROCODONE-ACETAMINOPHEN 7.5-325 MG PO TABS: ORAL_TABLET | ORAL | 0 refills | Status: DC

## 2017-10-09 NOTE — Progress Notes (Signed)
Patient Tracy Pratt, male DOB:[birth year], 83 y.o. ZDG:[id number]  Chief Complaint  Patient presents with  . Follow-up    LEFT KNEE AND HIP PAIN    HPI  Tracy Pratt is a 83 y.o. male who has chronic knee pain and chronic back pain.  His knees are better after the injections last time.  His back is still hurting and made worse with the cold weather and snow.  He has no new trauma.  He is taking his medicine. HPI  Body mass index is 31.15 kg/m.  ROS  Review of Systems  Constitutional:       Patient does not have Diabetes Mellitus. Patient has hypertension. Patient does not have COPD or shortness of breath. Patient does not have BMI > 35. Patient does not have current smoking history.  HENT: Negative for congestion.   Eyes:       Post cataract surgery, doing well   Respiratory: Negative for cough and shortness of breath.   Cardiovascular: Negative for chest pain and leg swelling.       Malignant hyperthermia with anesthesia  Endocrine: Negative for cold intolerance.  Genitourinary:       Post prostate cancer with treatment, doing well.  Musculoskeletal: Positive for arthralgias (prior of knees and shoulders, resolved for now, recurrent) and back pain.  Allergic/Immunologic: Negative for environmental allergies.  All other systems reviewed and are negative.   Past Medical History:  Diagnosis Date  . Adrenal adenoma    Left  . Arthritis   . BPH (benign prostatic hyperplasia)   . Cataract 2007, 2009   Bilateral  . Essential hypertension   . Malignant hyperthermia   . Prostate cancer ([place]) 08/08/2007   Seed Implant and XRT  . Spondylolisthesis    L5-S1    Past Surgical History:  Procedure Laterality Date  . APPENDECTOMY  1940  . BACK SURGERY  1979  . CATARACT EXTRACTION  2007  . CORNEAL TRANSPLANT  2007  . KNEE SURGERY  1996   left knee arthoscopic  . ROTATOR CUFF REPAIR  2015  . Torn meniscus left knee  1996  . TRANSURETHRAL RESECTION OF  PROSTATE  1997    Family History  Problem Relation Age of Onset  . Prostate cancer Brother   . Lung disease Brother        lobectomy    Social History Social History   Tobacco Use  . Smoking status: Former Smoker    Packs/day: 1.00    Years: 15.00    Pack years: 15.00    Types: Cigarettes  . Smokeless tobacco: Never Used  . Tobacco comment: quit 28 years ago  Substance Use Topics  . Alcohol use: No  . Drug use: No    Allergies  Allergen Reactions  . Diclofenac Sodium Anaphylaxis  . Nabumetone Anaphylaxis  . Nsaids Anaphylaxis  . Bee Venom Swelling  . Desflurane     Malignant hyperthermia  . Enflurane     Malignant hyperthermia  . Halothane     Malignant hyperthermia  . Isoflurane     Malignant hyperthermia  . Sevoflurane     Malignant hyperthermia  . Succinylcholine     Malignant hyperthermia    Current Outpatient Medications  Medication Sig Dispense Refill  . amoxicillin (AMOXIL) 500 MG tablet 2 PO BID FOR 10 DAYS 40 tablet 0  . clarithromycin (BIAXIN) 500 MG tablet 1 PO BID FOR 10 DAYS. 20 tablet 0  . furosemide (LASIX) 40 MG tablet  Take 40 mg by mouth daily as needed for fluid.     Marland Kitchen HYDROcodone-acetaminophen (NORCO) 7.5-325 MG tablet TAKE ONE TABLET BY MOUTH EVERY 4 HOURS AS NEEDED FOR MODERATE PAIN. MUST LAST 30 DAYS. 120 tablet 0  . lisinopril-hydrochlorothiazide (PRINZIDE,ZESTORETIC) 10-12.5 MG tablet Take 1 tablet by mouth daily.    Marland Kitchen loteprednol (LOTEMAX) 0.5 % ophthalmic suspension Place 1 drop into both eyes at bedtime.     . methocarbamol (ROBAXIN) 500 MG tablet TAKE (1) TABLET BY MOUTH (3) TIMES DAILY. (Patient taking differently: TAKE (1) TABLET BY MOUTH (3) TIMES DAILY AS NEEDED) 60 tablet 0  . pantoprazole (PROTONIX) 40 MG tablet 1 PO 30 MINUTES PRIOR TO MEALS BID FOR 3 MOS 60 tablet 2  . Vitamin D, Ergocalciferol, (DRISDOL) 50000 units CAPS capsule Take 50,000 Units by mouth every 7 (seven) days.     No current facility-administered  medications for this visit.      Physical Exam  Blood pressure (!) 180/91, pulse 67, height 5' 9.5" (1.765 m), weight 214 lb (97.1 kg).  Constitutional: overall normal hygiene, normal nutrition, well developed, normal grooming, normal body habitus. Assistive device:none  Musculoskeletal: gait and station Limp none, muscle tone and strength are normal, no tremors or atrophy is present.  .  Neurological: coordination overall normal.  Deep tendon reflex/nerve stretch intact.  Sensation normal.  Cranial nerves II-XII intact.   Skin:   Normal overall no scars, lesions, ulcers or rashes. No psoriasis.  Psychiatric: Alert and oriented x 3.  Recent memory intact, remote memory unclear.  Normal mood and affect. Well groomed.  Good eye contact.  Cardiovascular: overall no swelling, no varicosities, no edema bilaterally, normal temperatures of the legs and arms, no clubbing, cyanosis and good capillary refill.  Lymphatic: palpation is normal.  All other systems reviewed and are negative   The bilateral lower extremity is examined:  Inspection:  Thigh:  Non-tender and no defects  Knee has swelling 1+ effusion.                        Joint tenderness is present                        Patient is tender over the medial joint line  Lower Leg:  Has normal appearance and no tenderness or defects  Ankle:  Non-tender and no defects  Foot:  Non-tender and no defects Range of Motion:  Knee:  Range of motion is: 0-105 right, 0 to 110 left                        Crepitus is  present  Ankle:  Range of motion is normal. Strength and Tone:  The bilateral lower extremity has normal strength and tone. Stability:  Knee:  The knee is stable.  Ankle:  The ankle is stable.   Spine/Pelvis examination:  Inspection:  Overall, sacoiliac joint benign and hips nontender; without crepitus or defects.   Thoracic spine inspection: Alignment normal without kyphosis present   Lumbar spine inspection:   Alignment  with normal lumbar lordosis, without scoliosis apparent.   Thoracic spine palpation:  without tenderness of spinal processes   Lumbar spine palpation: without tenderness of lumbar area; without tightness of lumbar muscles    Range of Motion:   Lumbar flexion, forward flexion is normal without pain or tenderness    Lumbar extension is full without pain or tenderness  Left lateral bend is normal without pain or tenderness   Right lateral bend is normal without pain or tenderness   Straight leg raising is normal  Strength & tone: normal   Stability overall normal stability The patient has been educated about the nature of the problem(s) and counseled on treatment options.  The patient appeared to understand what I have discussed and is in agreement with it.  Encounter Diagnoses  Name Primary?  . Chronic pain of left knee Yes  . Chronic pain of right knee   . Spinal stenosis of lumbar region without neurogenic claudication     PLAN Call if any problems.  Precautions discussed.  Continue current medications.   Return to clinic 1 month   I have reviewed the [facility] [place] web site prior to prescribing narcotic medicine for this patient.  Electronically Signed Sanjuana Kava, MD 12/12/201810:36 AM

## 2017-10-15 DIAGNOSIS — G5701 Lesion of sciatic nerve, right lower limb: Secondary | ICD-10-CM | POA: Diagnosis not present

## 2017-10-15 DIAGNOSIS — M25569 Pain in unspecified knee: Secondary | ICD-10-CM | POA: Diagnosis not present

## 2017-10-15 DIAGNOSIS — M25552 Pain in left hip: Secondary | ICD-10-CM | POA: Diagnosis not present

## 2017-10-31 DIAGNOSIS — C44311 Basal cell carcinoma of skin of nose: Secondary | ICD-10-CM | POA: Diagnosis not present

## 2017-10-31 DIAGNOSIS — L570 Actinic keratosis: Secondary | ICD-10-CM | POA: Diagnosis not present

## 2017-10-31 DIAGNOSIS — L821 Other seborrheic keratosis: Secondary | ICD-10-CM | POA: Diagnosis not present

## 2017-10-31 DIAGNOSIS — D225 Melanocytic nevi of trunk: Secondary | ICD-10-CM | POA: Diagnosis not present

## 2017-10-31 DIAGNOSIS — X32XXXD Exposure to sunlight, subsequent encounter: Secondary | ICD-10-CM | POA: Diagnosis not present

## 2017-11-06 ENCOUNTER — Encounter: Payer: Self-pay | Admitting: Orthopaedic Surgery

## 2017-11-06 ENCOUNTER — Ambulatory Visit (INDEPENDENT_AMBULATORY_CARE_PROVIDER_SITE_OTHER): Payer: Medicare Other | Admitting: Orthopaedic Surgery

## 2017-11-06 VITALS — BP 145/70 | HR 61 | Ht 69.5 in | Wt 215.0 lb

## 2017-11-06 DIAGNOSIS — M48061 Spinal stenosis, lumbar region without neurogenic claudication: Secondary | ICD-10-CM | POA: Diagnosis not present

## 2017-11-06 DIAGNOSIS — M25561 Pain in right knee: Secondary | ICD-10-CM | POA: Diagnosis not present

## 2017-11-06 DIAGNOSIS — G8929 Other chronic pain: Secondary | ICD-10-CM | POA: Diagnosis not present

## 2017-11-06 DIAGNOSIS — M25562 Pain in left knee: Secondary | ICD-10-CM

## 2017-11-06 NOTE — Progress Notes (Signed)
Patient [name], male DOB:[birth year], 84 y.o. DDU:[id number]  Chief Complaint  Patient presents with  . Follow-up    knees and back    HPI  Tracy Pratt is a 84 y.o. male who has pain of the knees and the lower back.  His lower back is a little better.  He is doing his stretching exercises and taking his medicine. His knees have swelling and pain but do not seem to be affected by the weather.  He has more pain with more activity.  He has no giving way.  He has good and bad days. HPI  Body mass index is 31.29 kg/m.  ROS  Review of Systems  Constitutional:       Patient does not have Diabetes Mellitus. Patient has hypertension. Patient does not have COPD or shortness of breath. Patient does not have BMI > 35. Patient does not have current smoking history.  HENT: Negative for congestion.   Eyes:       Post cataract surgery, doing well   Respiratory: Negative for cough and shortness of breath.   Cardiovascular: Negative for chest pain and leg swelling.       Malignant hyperthermia with anesthesia  Endocrine: Negative for cold intolerance.  Genitourinary:       Post prostate cancer with treatment, doing well.  Musculoskeletal: Positive for arthralgias (prior of knees and shoulders, resolved for now, recurrent) and back pain.  Allergic/Immunologic: Negative for environmental allergies.  All other systems reviewed and are negative.   Past Medical History:  Diagnosis Date  . Adrenal adenoma    Left  . Arthritis   . BPH (benign prostatic hyperplasia)   . Cataract 2007, 2009   Bilateral  . Essential hypertension   . Malignant hyperthermia   . Prostate cancer ([place]) 08/08/2007   Seed Implant and XRT  . Spondylolisthesis    L5-S1    Past Surgical History:  Procedure Laterality Date  . APPENDECTOMY  1940  . BACK SURGERY  1979  . CATARACT EXTRACTION  2007  . CORNEAL TRANSPLANT  2007  . KNEE SURGERY  1996   left knee arthoscopic  . ROTATOR CUFF REPAIR   2015  . Torn meniscus left knee  1996  . TRANSURETHRAL RESECTION OF PROSTATE  1997    Family History  Problem Relation Age of Onset  . Prostate cancer Brother   . Lung disease Brother        lobectomy    Social History Social History   Tobacco Use  . Smoking status: Former Smoker    Packs/day: 1.00    Years: 15.00    Pack years: 15.00    Types: Cigarettes  . Smokeless tobacco: Never Used  . Tobacco comment: quit 28 years ago  Substance Use Topics  . Alcohol use: No  . Drug use: No    Allergies  Allergen Reactions  . Diclofenac Sodium Anaphylaxis  . Nabumetone Anaphylaxis  . Nsaids Anaphylaxis  . Bee Venom Swelling  . Desflurane     Malignant hyperthermia  . Enflurane     Malignant hyperthermia  . Halothane     Malignant hyperthermia  . Isoflurane     Malignant hyperthermia  . Sevoflurane     Malignant hyperthermia  . Succinylcholine     Malignant hyperthermia    Current Outpatient Medications  Medication Sig Dispense Refill  . amoxicillin (AMOXIL) 500 MG tablet 2 PO BID FOR 10 DAYS 40 tablet 0  . clarithromycin (BIAXIN) 500  MG tablet 1 PO BID FOR 10 DAYS. 20 tablet 0  . furosemide (LASIX) 40 MG tablet Take 40 mg by mouth daily as needed for fluid.     Marland Kitchen HYDROcodone-acetaminophen (NORCO) 7.5-325 MG tablet TAKE ONE TABLET BY MOUTH EVERY 4 HOURS AS NEEDED FOR MODERATE PAIN. MUST LAST 30 DAYS. 120 tablet 0  . lisinopril-hydrochlorothiazide (PRINZIDE,ZESTORETIC) 10-12.5 MG tablet Take 1 tablet by mouth daily.    Marland Kitchen loteprednol (LOTEMAX) 0.5 % ophthalmic suspension Place 1 drop into both eyes at bedtime.     . methocarbamol (ROBAXIN) 500 MG tablet TAKE (1) TABLET BY MOUTH (3) TIMES DAILY. (Patient taking differently: TAKE (1) TABLET BY MOUTH (3) TIMES DAILY AS NEEDED) 60 tablet 0  . pantoprazole (PROTONIX) 40 MG tablet 1 PO 30 MINUTES PRIOR TO MEALS BID FOR 3 MOS 60 tablet 2  . Vitamin D, Ergocalciferol, (DRISDOL) 50000 units CAPS capsule Take 50,000 Units by mouth  every 7 (seven) days.     No current facility-administered medications for this visit.      Physical Exam  Blood pressure (!) 145/70, pulse 61, height 5' 9.5" (1.765 m), weight 215 lb (97.5 kg).  Constitutional: overall normal hygiene, normal nutrition, well developed, normal grooming, normal body habitus. Assistive device:none  Musculoskeletal: gait and station Limp right, muscle tone and strength are normal, no tremors or atrophy is present.  .  Neurological: coordination overall normal.  Deep tendon reflex/nerve stretch intact.  Sensation normal.  Cranial nerves II-XII intact.   Skin:   Normal overall no scars, lesions, ulcers or rashes. No psoriasis.  Psychiatric: Alert and oriented x 3.  Recent memory intact, remote memory unclear.  Normal mood and affect. Well groomed.  Good eye contact.  Cardiovascular: overall no swelling, no varicosities, no edema bilaterally, normal temperatures of the legs and arms, no clubbing, cyanosis and good capillary refill.  Lymphatic: palpation is normal.  Spine/Pelvis examination:  Inspection:  Overall, sacoiliac joint benign and hips nontender; without crepitus or defects.   Thoracic spine inspection: Alignment normal without kyphosis present   Lumbar spine inspection:  Alignment  with normal lumbar lordosis, without scoliosis apparent.   Thoracic spine palpation:  without tenderness of spinal processes   Lumbar spine palpation: without tenderness of lumbar area; without tightness of lumbar muscles    Range of Motion:   Lumbar flexion, forward flexion is normal without pain or tenderness    Lumbar extension is full without pain or tenderness   Left lateral bend is normal without pain or tenderness   Right lateral bend is normal without pain or tenderness   Straight leg raising is normal  Strength & tone: normal   Stability overall normal stability  The bilateral lower extremity is examined:  Inspection:  Thigh:  Non-tender and no  defects  Knee has swelling 1+ bilaterally effusion.                        Joint tenderness is present                        Patient is tender over the medial joint line  Lower Leg:  Has normal appearance and no tenderness or defects  Ankle:  Non-tender and no defects  Foot:  Non-tender and no defects Range of Motion:  Knee:  Range of motion is: 0-105 right, 0-110 left  Crepitus is  present  Ankle:  Range of motion is normal. Strength and Tone:  The bilateral lower extremity has normal strength and tone. Stability:  Knee:  The knee is stable.  Ankle:  The ankle is stable.   All other systems reviewed and are negative   The patient has been educated about the nature of the problem(s) and counseled on treatment options.  The patient appeared to understand what I have discussed and is in agreement with it.  Encounter Diagnoses  Name Primary?  . Chronic pain of left knee Yes  . Chronic pain of right knee   . Spinal stenosis of lumbar region without neurogenic claudication     PLAN Call if any problems.  Precautions discussed.  Continue current medications.   Return to clinic 6 weeks   Electronically Signed Sanjuana Kava, MD 1/9/201910:36 AM

## 2017-12-12 DIAGNOSIS — L821 Other seborrheic keratosis: Secondary | ICD-10-CM | POA: Diagnosis not present

## 2017-12-12 DIAGNOSIS — Z08 Encounter for follow-up examination after completed treatment for malignant neoplasm: Secondary | ICD-10-CM | POA: Diagnosis not present

## 2017-12-12 DIAGNOSIS — X32XXXD Exposure to sunlight, subsequent encounter: Secondary | ICD-10-CM | POA: Diagnosis not present

## 2017-12-12 DIAGNOSIS — Z85828 Personal history of other malignant neoplasm of skin: Secondary | ICD-10-CM | POA: Diagnosis not present

## 2017-12-12 DIAGNOSIS — L570 Actinic keratosis: Secondary | ICD-10-CM | POA: Diagnosis not present

## 2017-12-18 ENCOUNTER — Ambulatory Visit: Payer: Medicare Other | Admitting: Orthopaedic Surgery

## 2018-02-03 DIAGNOSIS — Z6832 Body mass index (BMI) 32.0-32.9, adult: Secondary | ICD-10-CM | POA: Diagnosis not present

## 2018-02-03 DIAGNOSIS — E279 Disorder of adrenal gland, unspecified: Secondary | ICD-10-CM | POA: Diagnosis not present

## 2018-02-03 DIAGNOSIS — C61 Malignant neoplasm of prostate: Secondary | ICD-10-CM | POA: Diagnosis not present

## 2018-02-03 DIAGNOSIS — J329 Chronic sinusitis, unspecified: Secondary | ICD-10-CM | POA: Diagnosis not present

## 2018-02-03 DIAGNOSIS — E6609 Other obesity due to excess calories: Secondary | ICD-10-CM | POA: Diagnosis not present

## 2018-02-11 DIAGNOSIS — R05 Cough: Secondary | ICD-10-CM | POA: Diagnosis not present

## 2018-02-11 DIAGNOSIS — R0981 Nasal congestion: Secondary | ICD-10-CM | POA: Diagnosis not present

## 2018-02-11 DIAGNOSIS — R0982 Postnasal drip: Secondary | ICD-10-CM | POA: Diagnosis not present

## 2018-02-11 DIAGNOSIS — E6609 Other obesity due to excess calories: Secondary | ICD-10-CM | POA: Diagnosis not present

## 2018-02-11 DIAGNOSIS — J302 Other seasonal allergic rhinitis: Secondary | ICD-10-CM | POA: Diagnosis not present

## 2018-02-11 DIAGNOSIS — Z6832 Body mass index (BMI) 32.0-32.9, adult: Secondary | ICD-10-CM | POA: Diagnosis not present

## 2018-05-14 DIAGNOSIS — C61 Malignant neoplasm of prostate: Secondary | ICD-10-CM | POA: Diagnosis not present

## 2018-05-14 DIAGNOSIS — Z8546 Personal history of malignant neoplasm of prostate: Secondary | ICD-10-CM | POA: Diagnosis not present

## 2018-05-26 DIAGNOSIS — M2041 Other hammer toe(s) (acquired), right foot: Secondary | ICD-10-CM | POA: Diagnosis not present

## 2018-05-26 DIAGNOSIS — L851 Acquired keratosis [keratoderma] palmaris et plantaris: Secondary | ICD-10-CM | POA: Diagnosis not present

## 2018-06-02 DIAGNOSIS — Z1389 Encounter for screening for other disorder: Secondary | ICD-10-CM | POA: Diagnosis not present

## 2018-06-02 DIAGNOSIS — E6609 Other obesity due to excess calories: Secondary | ICD-10-CM | POA: Diagnosis not present

## 2018-06-02 DIAGNOSIS — Z6832 Body mass index (BMI) 32.0-32.9, adult: Secondary | ICD-10-CM | POA: Diagnosis not present

## 2018-06-02 DIAGNOSIS — R221 Localized swelling, mass and lump, neck: Secondary | ICD-10-CM | POA: Diagnosis not present

## 2018-06-23 DIAGNOSIS — M2041 Other hammer toe(s) (acquired), right foot: Secondary | ICD-10-CM | POA: Diagnosis not present

## 2018-06-23 DIAGNOSIS — L851 Acquired keratosis [keratoderma] palmaris et plantaris: Secondary | ICD-10-CM | POA: Diagnosis not present

## 2018-06-24 DIAGNOSIS — Z0000 Encounter for general adult medical examination without abnormal findings: Secondary | ICD-10-CM | POA: Diagnosis not present

## 2018-06-24 DIAGNOSIS — Z6831 Body mass index (BMI) 31.0-31.9, adult: Secondary | ICD-10-CM | POA: Diagnosis not present

## 2018-06-24 DIAGNOSIS — M542 Cervicalgia: Secondary | ICD-10-CM | POA: Diagnosis not present

## 2018-06-24 DIAGNOSIS — E782 Mixed hyperlipidemia: Secondary | ICD-10-CM | POA: Diagnosis not present

## 2018-06-24 DIAGNOSIS — E785 Hyperlipidemia, unspecified: Secondary | ICD-10-CM | POA: Diagnosis not present

## 2018-06-24 DIAGNOSIS — C61 Malignant neoplasm of prostate: Secondary | ICD-10-CM | POA: Diagnosis not present

## 2018-06-24 DIAGNOSIS — Z0001 Encounter for general adult medical examination with abnormal findings: Secondary | ICD-10-CM | POA: Diagnosis not present

## 2018-06-24 DIAGNOSIS — Z1389 Encounter for screening for other disorder: Secondary | ICD-10-CM | POA: Diagnosis not present

## 2018-06-24 DIAGNOSIS — R946 Abnormal results of thyroid function studies: Secondary | ICD-10-CM | POA: Diagnosis not present

## 2018-06-25 ENCOUNTER — Other Ambulatory Visit (HOSPITAL_COMMUNITY): Payer: Self-pay | Admitting: Physician Assistant

## 2018-06-25 DIAGNOSIS — M542 Cervicalgia: Secondary | ICD-10-CM

## 2018-07-10 ENCOUNTER — Ambulatory Visit (HOSPITAL_COMMUNITY)
Admission: RE | Admit: 2018-07-10 | Discharge: 2018-07-10 | Disposition: A | Discharge status: Home / Self Care | Payer: Medicare Other | Source: Ambulatory Visit | Attending: Physician Assistant | Admitting: Physician Assistant

## 2018-07-10 DIAGNOSIS — M542 Cervicalgia: Secondary | ICD-10-CM | POA: Insufficient documentation

## 2018-07-10 DIAGNOSIS — M4802 Spinal stenosis, cervical region: Secondary | ICD-10-CM | POA: Insufficient documentation

## 2018-07-10 DIAGNOSIS — R1310 Dysphagia, unspecified: Secondary | ICD-10-CM | POA: Diagnosis not present

## 2018-07-10 LAB — POCT I-STAT CREATININE: CREATININE: 1.1 mg/dL (ref 0.61–1.24)

## 2018-07-10 MED ORDER — IOHEXOL 300 MG/ML  SOLN
75.0000 mL | Freq: Once | INTRAMUSCULAR | Status: AC | PRN
  Administered 2018-07-10: 75 mL via INTRAVENOUS

## 2018-07-14 ENCOUNTER — Ambulatory Visit (HOSPITAL_COMMUNITY): Payer: Medicare Other

## 2018-07-15 ENCOUNTER — Encounter (HOSPITAL_COMMUNITY): Payer: Self-pay | Admitting: Physical Therapy

## 2018-07-16 ENCOUNTER — Telehealth: Payer: Self-pay | Admitting: Gastroenterology

## 2018-07-16 DIAGNOSIS — K862 Cyst of pancreas: Secondary | ICD-10-CM

## 2018-07-16 NOTE — Telephone Encounter (Signed)
MRI scheduled for 08/15/18 at 10:00am, arrive at 9:45am. NPO 4 hours prior to test.  Tried to call pt, no answer, no answering machine for home number. Appt letter mailed.

## 2018-07-16 NOTE — Telephone Encounter (Signed)
MRI rescheduled to 08/01/18 at 9:00am, arrive at 8:30am. NPO 4 hours before test. Appt letter given to daughter Magda Paganini).

## 2018-07-16 NOTE — Telephone Encounter (Addendum)
Called patient TO DISCUSS CONCERNS. UNABLE TO LVM. MAILBOX IS FULL.  I PERSONALLY REVIEWED THE MRCP OCT 2018 WITH DR. Ardeen Garland. OK TO HAVE IMAGING IN OCT 2018 DUE TO PT'S FAMILY HISTORY AND AGE.  WILL DISCUSS EGD/DIL FOR DYSPEPSIA/DYSPHAGIA AT [facility] WITH [facility] DUE TO MALIGNANT HYPERTHERMIA.

## 2018-07-17 ENCOUNTER — Telehealth: Payer: Self-pay | Admitting: Gastroenterology

## 2018-07-17 DIAGNOSIS — R1310 Dysphagia, unspecified: Secondary | ICD-10-CM

## 2018-07-17 NOTE — Telephone Encounter (Signed)
Referral has been sent.

## 2018-07-17 NOTE — Addendum Note (Signed)
Addended by: Inge Rise on: 07/17/2018 11:48 AM   Modules accepted: Orders

## 2018-07-17 NOTE — Telephone Encounter (Signed)
I SPOKE WITH DR. Carlean Purl. PLEASE PLACE REFERRAL FOR GI TO [facility], DX: SOLID DYSPHAGIA AND SEND PT'S MEDICAL RECORDS TO [facility]. NEED EGD/DIL FOR DYSPHAGIA AND PT HAS MALIGNANT HYPERTHERMIA ASAP.

## 2018-07-18 NOTE — Addendum Note (Signed)
Addended by: Danie Binder on: 07/18/2018 10:15 AM   Modules accepted: Orders

## 2018-07-18 NOTE — Telephone Encounter (Signed)
DR. Carlean Purl REQUEST BPE PRIOR TO [facility], Dx: dysphagia. Schedule next week.

## 2018-07-18 NOTE — Telephone Encounter (Signed)
BPE scheduled for 07/21/18 at 9:30am, arrival time 9:15am, npo 3 hrs prior. I have informed [name] (pt daughter) of appt details.

## 2018-07-21 ENCOUNTER — Ambulatory Visit (HOSPITAL_COMMUNITY)
Admission: RE | Admit: 2018-07-21 | Discharge: 2018-07-21 | Disposition: A | Discharge status: Home / Self Care | Payer: Medicare Other | Source: Ambulatory Visit | Attending: Gastroenterology | Admitting: Gastroenterology

## 2018-07-21 DIAGNOSIS — R1310 Dysphagia, unspecified: Secondary | ICD-10-CM | POA: Diagnosis not present

## 2018-07-21 DIAGNOSIS — K224 Dyskinesia of esophagus: Secondary | ICD-10-CM | POA: Diagnosis not present

## 2018-07-27 ENCOUNTER — Emergency Department (HOSPITAL_COMMUNITY): Payer: Medicare Other

## 2018-07-27 ENCOUNTER — Emergency Department (HOSPITAL_COMMUNITY)
Admission: EM | Admit: 2018-07-27 | Discharge: 2018-07-27 | Disposition: A | Discharge status: Home / Self Care | Payer: Medicare Other | Attending: Emergency Medicine | Admitting: Emergency Medicine

## 2018-07-27 ENCOUNTER — Other Ambulatory Visit: Payer: Self-pay

## 2018-07-27 ENCOUNTER — Encounter (HOSPITAL_COMMUNITY): Payer: Self-pay | Admitting: *Deleted

## 2018-07-27 DIAGNOSIS — Z87891 Personal history of nicotine dependence: Secondary | ICD-10-CM | POA: Insufficient documentation

## 2018-07-27 DIAGNOSIS — R0602 Shortness of breath: Secondary | ICD-10-CM | POA: Insufficient documentation

## 2018-07-27 DIAGNOSIS — Z79899 Other long term (current) drug therapy: Secondary | ICD-10-CM | POA: Diagnosis not present

## 2018-07-27 DIAGNOSIS — Z8546 Personal history of malignant neoplasm of prostate: Secondary | ICD-10-CM | POA: Diagnosis not present

## 2018-07-27 DIAGNOSIS — R7303 Prediabetes: Secondary | ICD-10-CM | POA: Diagnosis not present

## 2018-07-27 DIAGNOSIS — R251 Tremor, unspecified: Secondary | ICD-10-CM | POA: Insufficient documentation

## 2018-07-27 DIAGNOSIS — I10 Essential (primary) hypertension: Secondary | ICD-10-CM | POA: Insufficient documentation

## 2018-07-27 LAB — CBC WITH DIFFERENTIAL/PLATELET
BASOS ABS: 0 10*3/uL (ref 0.0–0.1)
BASOS PCT: 0 %
EOS ABS: 0.1 10*3/uL (ref 0.0–0.7)
EOS PCT: 1 %
HCT: 45 % (ref 39.0–52.0)
HEMOGLOBIN: 15.7 g/dL (ref 13.0–17.0)
Lymphocytes Relative: 14 %
Lymphs Abs: 1.1 10*3/uL (ref 0.7–4.0)
MCH: 32 pg (ref 26.0–34.0)
MCHC: 34.9 g/dL (ref 30.0–36.0)
MCV: 91.8 fL (ref 78.0–100.0)
Monocytes Absolute: 0.6 10*3/uL (ref 0.1–1.0)
Monocytes Relative: 8 %
NEUTROS PCT: 77 %
Neutro Abs: 5.9 10*3/uL (ref 1.7–7.7)
PLATELETS: 173 10*3/uL (ref 150–400)
RBC: 4.9 MIL/uL (ref 4.22–5.81)
RDW: 12.9 % (ref 11.5–15.5)
WBC: 7.7 10*3/uL (ref 4.0–10.5)

## 2018-07-27 LAB — COMPREHENSIVE METABOLIC PANEL
ALT: 27 U/L (ref 0–44)
AST: 23 U/L (ref 15–41)
Albumin: 3.8 g/dL (ref 3.5–5.0)
Alkaline Phosphatase: 42 U/L (ref 38–126)
Anion gap: 6 (ref 5–15)
BILIRUBIN TOTAL: 0.6 mg/dL (ref 0.3–1.2)
BUN: 19 mg/dL (ref 8–23)
CO2: 25 mmol/L (ref 22–32)
CREATININE: 1.04 mg/dL (ref 0.61–1.24)
Calcium: 8.4 mg/dL — ABNORMAL LOW (ref 8.9–10.3)
Chloride: 103 mmol/L (ref 98–111)
Glucose, Bld: 174 mg/dL — ABNORMAL HIGH (ref 70–99)
Potassium: 3.4 mmol/L — ABNORMAL LOW (ref 3.5–5.1)
Sodium: 134 mmol/L — ABNORMAL LOW (ref 135–145)
TOTAL PROTEIN: 6.8 g/dL (ref 6.5–8.1)

## 2018-07-27 LAB — BRAIN NATRIURETIC PEPTIDE: B NATRIURETIC PEPTIDE 5: 19 pg/mL (ref 0.0–100.0)

## 2018-07-27 LAB — CBG MONITORING, ED
GLUCOSE-CAPILLARY: 212 mg/dL — AB (ref 70–99)
GLUCOSE-CAPILLARY: 84 mg/dL (ref 70–99)
GLUCOSE-CAPILLARY: 89 mg/dL (ref 70–99)

## 2018-07-27 LAB — TROPONIN I

## 2018-07-27 NOTE — ED Triage Notes (Signed)
Pt c/o shaking that started around 8pm tonight, pt states that the shaking normally happens when his blood sugar drops, pt did eat two milky ways', drunk a boost in the last few hours.  Pt also sob that has been intermittent for the past few weeks,  Pt also reports that he had problems with the heat yesterday, states that he went to get something to eat and then came home about 45 minutes later and he became sob,  Denies any cough, pt states that he has had problems with swallowing and throat problems recently with a swallow test and ct scan performed this week,

## 2018-07-27 NOTE — Discharge Instructions (Addendum)
Keep your appointments to evaluate your neck discomfort and swallowing problems. Your blood sugar stabilized while you were in the ED. Call Dr Liliane Channel office on Monday, September 30 to let him know you had another episode. Let Dr Hilma Favors know you are having episodes of shortness of breath. Your chest xray was normal tonight and your EKG was unchanged (you have a right bundle branch block that has been present on your EKG's before).

## 2018-07-27 NOTE — ED Provider Notes (Signed)
[facility] EMERGENCY DEPARTMENT Provider Note   CSN: [id number] Arrival date & time: 07/27/18  0000   TIme seen 12:35 AM  History   Chief Complaint Chief Complaint  Patient presents with  . Shaking    HPI Tracy Pratt is a 84 y.o. male.  HPI patient states about 830 or 9 PM tonight he started feeling like maybe his blood sugar was getting low.  He states he felt shaky but denied diaphoresis, nausea, or vomiting.  He states he felt short of breath but denies any chest pain or feeling that his heart was racing.  He states about 1145 he ate 2 pieces of candy and drink a boost.  He states he feels like this shaking lasted till he got to the ED.  He states he actually ate more than usual today.  He last ate about 6 PM when he had 2 pieces of chicken, a biscuit, jelly, butter, and 2 pudding cups.  Patient has been evaluated by Dr. Dorris Fetch, endocrinologist after his primary  care doctor did a continuous glucose monitoring for 3 weeks and found during the night his blood sugar would drop into the 50s.  He has never been diagnosed as having hyperglycemia or hypoglycemia.  He has not had any episodes done several months.  He did not check his blood sugar tonight because everything was outdated on his machine and he does not know how to use it.  Patient does describe dyspnea on exertion.  Daughters are also concerned because he is having discomfort in his neck and swallowing difficulty that is already being evaluated.  He has had a CT of his neck and a barium swallow and is being referred to ENT and GI.  PCP [name], MD   Past Medical History:  Diagnosis Date  . Adrenal adenoma    Left  . Arthritis   . BPH (benign prostatic hyperplasia)   . Cataract 2007, 2009   Bilateral  . Essential hypertension   . Malignant hyperthermia   . Prostate cancer ([place]) 08/08/2007   Seed Implant and XRT  . Spondylolisthesis    L5-S1    Patient Active Problem List   Diagnosis Date Noted  .  Prediabetes 08/19/2017  . Subclinical hypothyroidism 08/19/2017  . Adrenal nodule ([place]) 08/19/2017  . Hypoglycemia 08/14/2017  . Spondylolisthesis at L5-S1 level 12/13/2015  . Midline low back pain without sciatica 12/13/2015  . Pain in joint, shoulder region 03/23/2014  . Muscle weakness (generalized) 03/23/2014  . Decreased range of motion of left shoulder 03/23/2014  . Prostate cancer ([place]) 10/31/2011  . Spondylolisthesis   . BPH (benign prostatic hyperplasia)   . Malignant hyperthermia   . Adrenal adenoma     Past Surgical History:  Procedure Laterality Date  . APPENDECTOMY  1940  . BACK SURGERY  1979  . CATARACT EXTRACTION  2007  . CORNEAL TRANSPLANT  2007  . KNEE SURGERY  1996   left knee arthoscopic  . ROTATOR CUFF REPAIR  2015  . Torn meniscus left knee  1996  . TRANSURETHRAL RESECTION OF PROSTATE  1997        Home Medications     Patient states he only takes lisinopril and possibly HCTZ, hydrocodone as needed, and Lotemax eyedrops  Prior to Admission medications   Medication Sig Start Date End Date Taking? Authorizing Provider  amoxicillin (AMOXIL) 500 MG tablet 2 PO BID FOR 10 DAYS 09/06/17   Fields, Marga Melnick, MD  clarithromycin (BIAXIN) 500 MG  tablet 1 PO BID FOR 10 DAYS. 09/06/17   Fields, Marga Melnick, MD  furosemide (LASIX) 40 MG tablet Take 40 mg by mouth daily as needed for fluid.     [provider]  HYDROcodone-acetaminophen (NORCO) 7.5-325 MG tablet TAKE ONE TABLET BY MOUTH EVERY 4 HOURS AS NEEDED FOR MODERATE PAIN. MUST LAST 30 DAYS. 10/09/17   Sanjuana Kava, MD  lisinopril-hydrochlorothiazide (PRINZIDE,ZESTORETIC) 10-12.5 MG tablet Take 1 tablet by mouth daily.    [provider]  loteprednol (LOTEMAX) 0.5 % ophthalmic suspension Place 1 drop into both eyes at bedtime.     [provider]  methocarbamol (ROBAXIN) 500 MG tablet TAKE (1) TABLET BY MOUTH (3) TIMES DAILY. Patient taking differently: TAKE (1) TABLET BY MOUTH (3) TIMES  DAILY AS NEEDED 07/08/17   Sanjuana Kava, MD  pantoprazole (PROTONIX) 40 MG tablet 1 PO 30 MINUTES PRIOR TO MEALS BID FOR 3 MOS 09/06/17   Fields, Marga Melnick, MD  Vitamin D, Ergocalciferol, (DRISDOL) 50000 units CAPS capsule Take 50,000 Units by mouth every 7 (seven) days.    [provider]    Family History Family History  Problem Relation Age of Onset  . Prostate cancer Brother   . Lung disease Brother        lobectomy    Social History Social History   Tobacco Use  . Smoking status: Former Smoker    Packs/day: 1.00    Years: 15.00    Pack years: 15.00    Types: Cigarettes  . Smokeless tobacco: Never Used  . Tobacco comment: quit 28 years ago  Substance Use Topics  . Alcohol use: No  . Drug use: No  lives at home Lives alone   Allergies   Diclofenac sodium; Nabumetone; Nsaids; Bee venom; Desflurane; Enflurane; Halothane; Isoflurane; Sevoflurane; and Succinylcholine   Review of Systems Review of Systems  All other systems reviewed and are negative.    Physical Exam Updated Vital Signs BP (!) 157/72 (BP Location: Left Arm)   Pulse 77   Temp 98 F (36.7 C) (Oral)   Resp 18   Ht 5' 9.5" (1.765 m)   Wt 95.3 kg   SpO2 97%   BMI 30.57 kg/m   Vital signs normal except hypertension   Physical Exam  Constitutional: He is oriented to person, place, and time. He appears well-developed and well-nourished.  Non-toxic appearance. He does not appear ill. No distress.  HENT:  Head: Normocephalic and atraumatic.  Right Ear: External ear normal.  Left Ear: External ear normal.  Nose: Nose normal. No mucosal edema or rhinorrhea.  Mouth/Throat: Oropharynx is clear and moist and mucous membranes are normal. No dental abscesses or uvula swelling.  Eyes: Pupils are equal, round, and reactive to light. Conjunctivae and EOM are normal.  Neck: Normal range of motion and full passive range of motion without pain. Neck supple.  Cardiovascular: Normal rate, regular rhythm  and normal heart sounds. Exam reveals no gallop and no friction rub.  No murmur heard. Pulmonary/Chest: Effort normal and breath sounds normal. No respiratory distress. He has no wheezes. He has no rhonchi. He has no rales. He exhibits no tenderness and no crepitus.  Abdominal: Soft. Normal appearance and bowel sounds are normal. He exhibits no distension. There is no tenderness. There is no rebound and no guarding.  Musculoskeletal: Normal range of motion. He exhibits no edema or tenderness.  Moves all extremities well.  No shaking or tremor noted.  Neurological: He is alert and oriented to person,  place, and time. He has normal strength. No cranial nerve deficit.  Skin: Skin is warm, dry and intact. No rash noted. No erythema. No pallor.  Psychiatric: He has a normal mood and affect. His speech is normal and behavior is normal. His mood appears not anxious.  Nursing note and vitals reviewed.    ED Treatments / Results  Labs (all labs ordered are listed, but only abnormal results are displayed) Results for orders placed or performed during the hospital encounter of 07/27/18  Comprehensive metabolic panel  Result Value Ref Range   Sodium 134 (L) 135 - 145 mmol/L   Potassium 3.4 (L) 3.5 - 5.1 mmol/L   Chloride 103 98 - 111 mmol/L   CO2 25 22 - 32 mmol/L   Glucose, Bld 174 (H) 70 - 99 mg/dL   BUN 19 8 - 23 mg/dL   Creatinine, Ser 1.04 0.61 - 1.24 mg/dL   Calcium 8.4 (L) 8.9 - 10.3 mg/dL   Total Protein 6.8 6.5 - 8.1 g/dL   Albumin 3.8 3.5 - 5.0 g/dL   AST 23 15 - 41 U/L   ALT 27 0 - 44 U/L   Alkaline Phosphatase 42 38 - 126 U/L   Total Bilirubin 0.6 0.3 - 1.2 mg/dL   GFR calc non Af Amer >60 >60 mL/min   GFR calc Af Amer >60 >60 mL/min   Anion gap 6 5 - 15  CBC with Differential  Result Value Ref Range   WBC 7.7 4.0 - 10.5 K/uL   RBC 4.90 4.22 - 5.81 MIL/uL   Hemoglobin 15.7 13.0 - 17.0 g/dL   HCT 45.0 39.0 - 52.0 %   MCV 91.8 78.0 - 100.0 fL   MCH 32.0 26.0 - 34.0 pg   MCHC  34.9 30.0 - 36.0 g/dL   RDW 12.9 11.5 - 15.5 %   Platelets 173 150 - 400 K/uL   Neutrophils Relative % 77 %   Neutro Abs 5.9 1.7 - 7.7 K/uL   Lymphocytes Relative 14 %   Lymphs Abs 1.1 0.7 - 4.0 K/uL   Monocytes Relative 8 %   Monocytes Absolute 0.6 0.1 - 1.0 K/uL   Eosinophils Relative 1 %   Eosinophils Absolute 0.1 0.0 - 0.7 K/uL   Basophils Relative 0 %   Basophils Absolute 0.0 0.0 - 0.1 K/uL  Troponin I  Result Value Ref Range   Troponin I <0.03 <0.03 ng/mL  Brain natriuretic peptide  Result Value Ref Range   B Natriuretic Peptide 19.0 0.0 - 100.0 pg/mL  CBG monitoring, ED  Result Value Ref Range   Glucose-Capillary 212 (H) 70 - 99 mg/dL  CBG monitoring, ED  Result Value Ref Range   Glucose-Capillary 89 70 - 99 mg/dL  CBG monitoring, ED  Result Value Ref Range   Glucose-Capillary 84 70 - 99 mg/dL   Laboratory interpretation all normal except mild hyponatremia, mild hypokalemia, hyperglycemia     EKG EKG Interpretation  Date/Time:  Sunday July 27 2018 01:16:15 EDT Ventricular Rate:  57 PR Interval:    QRS Duration: 125 QT Interval:  427 QTC Calculation: 416 R Axis:   -57 Text Interpretation:  Sinus rhythm Right bundle branch block No significant change since last tracing  26 Jun 2017 Confirmed by Rolland Porter [phone]) on 07/27/2018 2:06:33 AM   Radiology Dg Chest 2 View  Result Date: 07/27/2018 CLINICAL DATA:  84 year old male with intermittent shortness of breath. EXAM: CHEST - 2 VIEW COMPARISON:  None. FINDINGS: The heart size  and mediastinal contours are within normal limits. Both lungs are clear. The visualized skeletal structures are unremarkable. IMPRESSION: No active cardiopulmonary disease. Electronically Signed   By: Anner Crete M.D.   On: 07/27/2018 01:17    Ct Soft Tissue Neck W Contrast  Result Date: 07/10/2018 CLINICAL DATA:  Difficulty swallowing and left-sided neck pain for 2 months.  IMPRESSION: 1. No neck mass or acute abnormality  identified. 2. Widespread disc and facet degeneration in the cervical spine with severe multilevel neural foraminal stenosis. Electronically Signed   By: Logan Bores M.D.   On: 07/10/2018 14:37   Dg Esophagus  Result Date: 07/21/2018 CLINICAL DATA:  Pain in LEFT side of throat since June and July, trouble swallowing solids and liquids, choking sensation LEFT side of throat, coughing, history hypertension, prostate cancerIMPRESSION: No significant esophageal abnormalities identified. Age appropriate mild esophageal dysmotility. Electronically Signed   By: Lavonia Dana M.D.   On: 07/21/2018 10:28   Has last CBG around 3:15 AM  Procedures Procedures (including critical care time)  Medications Ordered in ED Medications - No data to display   Initial Impression / Assessment and Plan / ED Course  I have reviewed the triage vital signs and the nursing notes.  Pertinent labs & imaging results that were available during my care of the patient were reviewed by me and considered in my medical decision making (see chart for details).     Daughters were very concerned about his complaints of shortness of breath.  He has not had a chest x-ray in several years and one was done.  Laboratory testing was done.  I have explained as far as his swallowing difficulty he would need to follow-up with the specialist that he has already been referred to.  Recheck at 225 patient's repeat CBG is now 89.  He states he does not feel any worse.  We discussed getting another CBG in about an hour however if he feels worse he should let the nurse know.  We discussed his chest x-ray result.  His last CBG around 3:15 AM was 84.  At this point I do not feel concerned that he is become going to become hypoglycemic.  He was discharged home.  He can follow-up with his endocrinologist, Dr. Dorris Fetch, for further evaluation.  He can also continue his evaluation for his swallowing difficulties as previously arranged.  He should let his  primary care doctor know he is having episodes of shortness of breath for further evaluation.  Final Clinical Impressions(s) / ED Diagnoses   Final diagnoses:  Episode of shaking    ED Discharge Orders    None     Plan discharge  Rolland Porter, MD, Barbette Or, MD 07/27/18 [phone]

## 2018-07-29 ENCOUNTER — Ambulatory Visit (INDEPENDENT_AMBULATORY_CARE_PROVIDER_SITE_OTHER): Payer: Medicare Other | Admitting: Internal Medicine

## 2018-07-29 ENCOUNTER — Encounter: Payer: Self-pay | Admitting: Internal Medicine

## 2018-07-29 VITALS — BP 118/70 | HR 68 | Ht 69.5 in | Wt 215.0 lb

## 2018-07-29 DIAGNOSIS — T883XXD Malignant hyperthermia due to anesthesia, subsequent encounter: Secondary | ICD-10-CM

## 2018-07-29 DIAGNOSIS — R1310 Dysphagia, unspecified: Secondary | ICD-10-CM | POA: Diagnosis not present

## 2018-07-29 DIAGNOSIS — E162 Hypoglycemia, unspecified: Secondary | ICD-10-CM

## 2018-07-29 DIAGNOSIS — K862 Cyst of pancreas: Secondary | ICD-10-CM | POA: Diagnosis not present

## 2018-07-29 DIAGNOSIS — Z8619 Personal history of other infectious and parasitic diseases: Secondary | ICD-10-CM

## 2018-07-29 NOTE — H&P (View-Only) (Signed)
Tracy Pratt 84 y.o. [birth year] [id number]  Assessment & Plan:   Encounter Diagnoses  Name Primary?  Marland Kitchen Dysphagia, prominent cricopharyngeus on Ba swallow Yes  . Malignant hyperthermia, subsequent encounter   . Hypoglycemia   . Pancreatic cysts   . History of Helicobacter pylori infection     EGD and dilation makes sense - might dilate the cricopharyngeus and provide relief.  but will need appropriate precautions and to be done at hospital given malignant hyperthermia - and concerns. Note that propofol not a risk for problems with malignant hyperthermia. Not discussed at visit as plans to do at hospital but now ? If we might do in our Endoscopy ASC.  Will discuss. This should be safe.  Will check for H pylori eradication with CLO test at EGD.  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 Interesting and appropriate thoughts about possible islet cell tumors. Not sure he has had right lab tests to see if he has insulinoma - do not see any insulin levels from endocrine eval. Would need fasting insulin levels and other tests from what I see in Up To Date. Not sure we have documented hypoglycemia with spells and BS in 50's at night does not seem severe. MR reassuring but islet cell tumors may not be seen.  I appreciate the opportunity to care for this patient. Cc: Tracy Sites, MD Tracy Drain, MD  Subjective:   Chief Complaint: dysphagia  HPI 78 yo wm, father of Tracy Pratt, Tracy Pratt and referred by Tracy Drain, MD - having dysphagia with hx malignant hyperthermia.  Unable to have EGD at [facility] because of malignant hyperthermia.   He describes a pressure/swelling in left neck area, solid food passes slowly. Takes 30 minutes to eat a sandwich. No clear impact dysphagia. + Sore throat. He had a Ba swallow 9/23that showed mildly prominent cricopharyngeus muscle o/w neg. Tablet passed. Images viewed.Marland Kitchen Hx C spine DJD as seen  on CT of neck 07/10/18 (images viewed) - has G1 1 anterolisthesis C7 on T1, mild-mod spinal stenosis c5-6 + c6-7. Severe foraminal stenosis left C3-4 and C4-5 + right C5-6 and C6-7.  He has been losing weight with theses sxs. Sxs began after Abx Rx Tx H pylori + breath test (08/2017)..  Has an ENT evaluation pending. Decreased saliva?  Also has had spells of shakes, diaphoresis, nauseahypoglycemia with BS into 50's. Has seen an endocrinologist but no problems found. Has had MR looking for insulinoma 07/2017 - but had 2 small cystic lesions in tail and body of pancreas, largest 12 mm ? IPMN's, + tiny adrenal nodule and right RP cyst.  Yesterday had repeat MR with multiple dilated side ducts/mild dorsal pancreatic duct dilation along tail and body suggesting prior pancreatitis, IPMN not excluded.   Daughter asks about lymph node Left neck today Wt Readings from Last 3 Encounters:  07/29/18 215 lb (97.5 kg)  07/27/18 210 lb (95.3 kg)  11/06/17 215 lb (97.5 kg)   05/2017 219 # Allergies  Allergen Reactions  . Diclofenac Sodium Anaphylaxis  . Nabumetone Anaphylaxis  . Nsaids Anaphylaxis  . Bee Venom Swelling  . Desflurane     Malignant hyperthermia  . Enflurane     Malignant hyperthermia  . Halothane     Malignant hyperthermia  . Isoflurane     Malignant hyperthermia  . Sevoflurane     Malignant hyperthermia  . Succinylcholine     Malignant  hyperthermia   Current Meds  Medication Sig  . furosemide (LASIX) 40 MG tablet Take 40 mg by mouth daily as needed for fluid.   Marland Kitchen HYDROcodone-acetaminophen (NORCO) 7.5-325 MG tablet TAKE ONE TABLET BY MOUTH EVERY 4 HOURS AS NEEDED FOR MODERATE PAIN. MUST LAST 30 DAYS.  Marland Kitchen lisinopril-hydrochlorothiazide (PRINZIDE,ZESTORETIC) 10-12.5 MG tablet Take 1 tablet by mouth daily.  Marland Kitchen loteprednol (LOTEMAX) 0.5 % ophthalmic suspension Place 1 drop into both eyes at bedtime.    Past Medical History:  Diagnosis Date  . Adrenal adenoma    Left  . Arthritis    . BPH (benign prostatic hyperplasia)   . Cataract 2007, 2009   Bilateral  . Colon polyps   . Essential hypertension   . Malignant hyperthermia   . Prostate cancer ([place]) 08/08/2007   Seed Implant and XRT  . Spondylolisthesis    L5-S1   Past Surgical History:  Procedure Laterality Date  . APPENDECTOMY  1940  . BACK SURGERY  1979  . CATARACT EXTRACTION  2007  . CORNEAL TRANSPLANT  2007  . KNEE SURGERY  1996   left knee arthoscopic  . ROTATOR CUFF REPAIR  2015  . Torn meniscus left knee  1996  . TRANSURETHRAL RESECTION OF PROSTATE  1997   Social History   Social History Narrative  . Not on file   family history includes Lung disease in his brother; Prostate cancer in his brother.   Review of Systems As per HPI + back pain, dyspnea Objective:   Physical Exam BP 118/70   Pulse 68   Ht 5' 9.5" (1.765 m)   Wt 215 lb (97.5 kg)   BMI 31.29 kg/m  NAD Elderly, vigorous-appearing Eyes anicteric Neck - LN L submandibular area sl;ightly prominent nodule, no masses Pharynx - clear, oral caity NL Lungs CTA Cor S1S2 2/6 SEM Abd soft, NT no HSM/mass BS+ Ext no c/ce Alert and oriented x 3 with approp mood/affect  Data reviewed ED notes GI notes since 2018 See HPI Cortisol tests NL VMA 24 hr urine + 5HIAA NL

## 2018-07-29 NOTE — Patient Instructions (Signed)
We are going to contact you with the date and time of your EGD which will be set up at the hospital.   Today we are giving you blank instructions to take with you today.    I appreciate the opportunity to care for you. Silvano Rusk, MD, [facility]

## 2018-07-29 NOTE — Progress Notes (Signed)
Tracy Pratt 84 y.o. 05/06/1931 [id number]  Assessment & Plan:   Encounter Diagnoses  Name Primary?  Marland Kitchen Dysphagia, prominent cricopharyngeus on Ba swallow Yes  . Malignant hyperthermia, subsequent encounter   . Hypoglycemia   . Pancreatic cysts   . History of Helicobacter pylori infection     EGD and dilation makes sense - might dilate the cricopharyngeus and provide relief.  but will need appropriate precautions and to be done at hospital given malignant hyperthermia - and concerns. Note that propofol not a risk for problems with malignant hyperthermia. Not discussed at visit as plans to do at hospital but now ? If we might do in our Endoscopy ASC.  Will discuss. This should be safe.  Will check for H pylori eradication with CLO test at EGD.  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 Interesting and appropriate thoughts about possible islet cell tumors. Not sure he has had right lab tests to see if he has insulinoma - do not see any insulin levels from endocrine eval. Would need fasting insulin levels and other tests from what I see in Up To Date. Not sure we have documented hypoglycemia with spells and BS in 50's at night does not seem severe. MR reassuring but islet cell tumors may not be seen.  I appreciate the opportunity to care for this patient. Cc: Tracy Sites, MD Tracy Drain, MD  Subjective:   Chief Complaint: dysphagia  HPI 84 yo wm, father of Tracy Pratt, [place] and referred by Tracy Drain, MD - having dysphagia with hx malignant hyperthermia.  Unable to have EGD at [facility] because of malignant hyperthermia.   He describes a pressure/swelling in left neck area, solid food passes slowly. Takes 30 minutes to eat a sandwich. No clear impact dysphagia. + Sore throat. He had a Ba swallow 9/23that showed mildly prominent cricopharyngeus muscle o/w neg. Tablet passed. Images viewed.Marland Kitchen Hx C spine DJD as seen  on CT of neck 07/10/18 (images viewed) - has G1 1 anterolisthesis C7 on T1, mild-mod spinal stenosis c5-6 + c6-7. Severe foraminal stenosis left C3-4 and C4-5 + right C5-6 and C6-7.  He has been losing weight with theses sxs. Sxs began after Abx Rx Tx H pylori + breath test (08/2017)..  Has an ENT evaluation pending. Decreased saliva?  Also has had spells of shakes, diaphoresis, nauseahypoglycemia with BS into 50's. Has seen an endocrinologist but no problems found. Has had MR looking for insulinoma 07/2017 - but had 2 small cystic lesions in tail and body of pancreas, largest 12 mm ? IPMN's, + tiny adrenal nodule and right RP cyst.  Yesterday had repeat MR with multiple dilated side ducts/mild dorsal pancreatic duct dilation along tail and body suggesting prior pancreatitis, IPMN not excluded.   Daughter asks about lymph node Left neck today Wt Readings from Last 3 Encounters:  07/29/18 215 lb (97.5 kg)  07/27/18 210 lb (95.3 kg)  11/06/17 215 lb (97.5 kg)   05/2017 219 # Allergies  Allergen Reactions  . Diclofenac Sodium Anaphylaxis  . Nabumetone Anaphylaxis  . Nsaids Anaphylaxis  . Bee Venom Swelling  . Desflurane     Malignant hyperthermia  . Enflurane     Malignant hyperthermia  . Halothane     Malignant hyperthermia  . Isoflurane     Malignant hyperthermia  . Sevoflurane     Malignant hyperthermia  . Succinylcholine     Malignant  hyperthermia   Current Meds  Medication Sig  . furosemide (LASIX) 40 MG tablet Take 40 mg by mouth daily as needed for fluid.   Marland Kitchen HYDROcodone-acetaminophen (NORCO) 7.5-325 MG tablet TAKE ONE TABLET BY MOUTH EVERY 4 HOURS AS NEEDED FOR MODERATE PAIN. MUST LAST 30 DAYS.  Marland Kitchen lisinopril-hydrochlorothiazide (PRINZIDE,ZESTORETIC) 10-12.5 MG tablet Take 1 tablet by mouth daily.  Marland Kitchen loteprednol (LOTEMAX) 0.5 % ophthalmic suspension Place 1 drop into both eyes at bedtime.    Past Medical History:  Diagnosis Date  . Adrenal adenoma    Left  . Arthritis    . BPH (benign prostatic hyperplasia)   . Cataract 2007, 2009   Bilateral  . Colon polyps   . Essential hypertension   . Malignant hyperthermia   . Prostate cancer ([place]) 08/08/2007   Seed Implant and XRT  . Spondylolisthesis    L5-S1   Past Surgical History:  Procedure Laterality Date  . APPENDECTOMY  1940  . BACK SURGERY  1979  . CATARACT EXTRACTION  2007  . CORNEAL TRANSPLANT  2007  . KNEE SURGERY  1996   left knee arthoscopic  . ROTATOR CUFF REPAIR  2015  . Torn meniscus left knee  1996  . TRANSURETHRAL RESECTION OF PROSTATE  1997   Social History   Social History Narrative  . Not on file   family history includes Lung disease in his brother; Prostate cancer in his brother.   Review of Systems As per HPI + back pain, dyspnea Objective:   Physical Exam BP 118/70   Pulse 68   Ht 5' 9.5" (1.765 m)   Wt 215 lb (97.5 kg)   BMI 31.29 kg/m  NAD Elderly, vigorous-appearing Eyes anicteric Neck - LN L submandibular area sl;ightly prominent nodule, no masses Pharynx - clear, oral caity NL Lungs CTA Cor S1S2 2/6 SEM Abd soft, NT no HSM/mass BS+ Ext no c/ce Alert and oriented x 3 with approp mood/affect  Data reviewed ED notes GI notes since 2018 See HPI Cortisol tests NL VMA 24 hr urine + 5HIAA NL

## 2018-08-01 ENCOUNTER — Ambulatory Visit (HOSPITAL_COMMUNITY): Payer: Medicare Other

## 2018-08-01 ENCOUNTER — Ambulatory Visit (HOSPITAL_COMMUNITY)
Admission: RE | Admit: 2018-08-01 | Discharge: 2018-08-01 | Disposition: A | Discharge status: Home / Self Care | Payer: Medicare Other | Source: Ambulatory Visit | Attending: Gastroenterology | Admitting: Gastroenterology

## 2018-08-01 ENCOUNTER — Other Ambulatory Visit: Payer: Self-pay | Admitting: Gastroenterology

## 2018-08-01 DIAGNOSIS — M5136 Other intervertebral disc degeneration, lumbar region: Secondary | ICD-10-CM | POA: Insufficient documentation

## 2018-08-01 DIAGNOSIS — K862 Cyst of pancreas: Secondary | ICD-10-CM | POA: Diagnosis not present

## 2018-08-01 DIAGNOSIS — M47816 Spondylosis without myelopathy or radiculopathy, lumbar region: Secondary | ICD-10-CM | POA: Insufficient documentation

## 2018-08-01 DIAGNOSIS — I700 Atherosclerosis of aorta: Secondary | ICD-10-CM | POA: Insufficient documentation

## 2018-08-01 DIAGNOSIS — R932 Abnormal findings on diagnostic imaging of liver and biliary tract: Secondary | ICD-10-CM | POA: Diagnosis not present

## 2018-08-01 DIAGNOSIS — K8689 Other specified diseases of pancreas: Secondary | ICD-10-CM | POA: Diagnosis not present

## 2018-08-01 MED ORDER — GADOBUTROL 1 MMOL/ML IV SOLN
9.0000 mL | Freq: Once | INTRAVENOUS | Status: AC | PRN
  Administered 2018-08-01: 9 mL via INTRAVENOUS

## 2018-08-02 ENCOUNTER — Encounter: Payer: Self-pay | Admitting: Internal Medicine

## 2018-08-04 NOTE — Progress Notes (Signed)
CC'D TO PCP AND ON RECALL  °

## 2018-08-06 ENCOUNTER — Encounter: Payer: Self-pay | Admitting: Orthopaedic Surgery

## 2018-08-06 ENCOUNTER — Ambulatory Visit (INDEPENDENT_AMBULATORY_CARE_PROVIDER_SITE_OTHER): Payer: Medicare Other | Admitting: Orthopaedic Surgery

## 2018-08-06 VITALS — BP 129/73 | HR 69 | Ht 69.5 in | Wt 215.0 lb

## 2018-08-06 DIAGNOSIS — M5030 Other cervical disc degeneration, unspecified cervical region: Secondary | ICD-10-CM | POA: Diagnosis not present

## 2018-08-06 DIAGNOSIS — I10 Essential (primary) hypertension: Secondary | ICD-10-CM | POA: Diagnosis not present

## 2018-08-06 NOTE — Patient Instructions (Signed)
An MRI was ordered for you and scheduled at [facility] for Tuesday 08/19/09.  You will need to arrive at 3:30pm for a 4:00pm scan.  Please call central scheduling if you find that you need to change this appointment.  [phone].

## 2018-08-06 NOTE — Progress Notes (Signed)
Patient Tracy Pratt, male DOB:[birth year], 84 y.o. GNF:[id number]  Chief Complaint  Patient presents with  . Torticollis    Neck pain.    HPI  Tracy Pratt is a 84 y.o. male who has developed problems with his neck and swallowing.  He has been seen and evaluated at [facility]  He had a CT scan of the neck.  The patient feels he has a mass of the upper left neck just below his ear and has numbness at times.  His family has noticed some swelling there at times and it comes and goes.  The CT scan done on 07-10-18 showed: IMPRESSION: 1. No neck mass or acute abnormality identified. 2. Widespread disc and facet degeneration in the cervical spine with severe multilevel neural foraminal stenosis.  He has also been seen by GI and had several studies done including a MRI of the abdomen on 08-01-18.  His neck is bothering him more with a feeling there is something inside of the neck near the left sternocleidomastoid muscle.  He has some numbness going down to the left hand index finger at times but he has no weakness.  His swallowing has improved. He has no trauma, no redness.    I have reviewed his notes from [place] and GI.  I have reviewed the CT scans, x-rays and MRI.  I will get a MRI of the cervical spine with and without contrast.  He is agreeable to this.   Body mass index is 31.29 kg/m.  ROS  Review of Systems  Constitutional: Positive for activity change.       Patient does not have Diabetes Mellitus. Patient has hypertension. Patient does not have COPD or shortness of breath. Patient does not have BMI > 35. Patient does not have current smoking history.  HENT: Negative for congestion.   Eyes:       Post cataract surgery, doing well   Respiratory: Negative for cough and shortness of breath.   Cardiovascular: Negative for chest pain and leg swelling.       Malignant hyperthermia with anesthesia  Endocrine: Negative for cold intolerance.  Genitourinary:     Post prostate cancer with treatment, doing well.  Musculoskeletal: Positive for arthralgias (prior of knees and shoulders, resolved for now, recurrent), back pain and neck pain.  Allergic/Immunologic: Negative for environmental allergies.  All other systems reviewed and are negative.   All other systems reviewed and are negative.  The following is a summary of the past history medically, past history surgically, known current medicines, social history and family history.  This information is gathered electronically by the computer from prior information and documentation.  I review this each visit and have found including this information at this point in the chart is beneficial and informative.    Past Medical History:  Diagnosis Date  . Adrenal adenoma    Left  . Arthritis   . BPH (benign prostatic hyperplasia)   . Cataract 2007, 2009   Bilateral  . Colon polyps   . Essential hypertension   . Malignant hyperthermia   . Prostate cancer ([place]) 08/08/2007   Seed Implant and XRT  . Spondylolisthesis    L5-S1    Past Surgical History:  Procedure Laterality Date  . APPENDECTOMY  1940  . BACK SURGERY  1979  . CATARACT EXTRACTION  2007  . CORNEAL TRANSPLANT  2007  . KNEE SURGERY  1996   left knee arthoscopic  . ROTATOR CUFF REPAIR  2015  .  Torn meniscus left knee  1996  . TRANSURETHRAL RESECTION OF PROSTATE  1997    Family History  Problem Relation Age of Onset  . Prostate cancer Brother   . Lung disease Brother        lobectomy    Social History Social History   Tobacco Use  . Smoking status: Former Smoker    Packs/day: 1.00    Years: 15.00    Pack years: 15.00    Types: Cigarettes  . Smokeless tobacco: Never Used  . Tobacco comment: quit 28 years ago  Substance Use Topics  . Alcohol use: No  . Drug use: No    Allergies  Allergen Reactions  . Diclofenac Sodium Anaphylaxis  . Nabumetone Anaphylaxis  . Nsaids Anaphylaxis  . Bee Venom Swelling  .  Desflurane     Malignant hyperthermia  . Enflurane     Malignant hyperthermia  . Halothane     Malignant hyperthermia  . Isoflurane     Malignant hyperthermia  . Sevoflurane     Malignant hyperthermia  . Succinylcholine     Malignant hyperthermia    Current Outpatient Medications  Medication Sig Dispense Refill  . furosemide (LASIX) 40 MG tablet Take 40 mg by mouth daily as needed for fluid.     Marland Kitchen HYDROcodone-acetaminophen (NORCO) 7.5-325 MG tablet TAKE ONE TABLET BY MOUTH EVERY 4 HOURS AS NEEDED FOR MODERATE PAIN. MUST LAST 30 DAYS. 120 tablet 0  . lisinopril-hydrochlorothiazide (PRINZIDE,ZESTORETIC) 10-12.5 MG tablet Take 1 tablet by mouth daily.    Marland Kitchen loteprednol (LOTEMAX) 0.5 % ophthalmic suspension Place 1 drop into both eyes at bedtime.      No current facility-administered medications for this visit.      Physical Exam  Blood pressure 129/73, pulse 69, height 5' 9.5" (1.765 m), weight 215 lb (97.5 kg).  Constitutional: overall normal hygiene, normal nutrition, well developed, normal grooming, normal body habitus. Assistive device:none  Musculoskeletal: gait and station Limp none, muscle tone and strength are normal, no tremors or atrophy is present.  .  Neurological: coordination overall normal.  Deep tendon reflex/nerve stretch intact.  Sensation normal.  Cranial nerves II-XII intact.   Skin:   Normal overall no scars, lesions, ulcers or rashes. No psoriasis.  Psychiatric: Alert and oriented x 3.  Recent memory intact, remote memory unclear.  Normal mood and affect. Well groomed.  Good eye contact.  Cardiovascular: overall no swelling, no varicosities, no edema bilaterally, normal temperatures of the legs and arms, no clubbing, cyanosis and good capillary refill.  Lymphatic: palpation is normal.  Neck has full ROM and tenderness of the area just below the mastoid area of the left ear and no mass felt.  Sternocleidomastoid muscle negative.  ROM of the shoulder is  full.  NV intact.  Grips are normal.  All other systems reviewed and are negative   The patient has been educated about the nature of the problem(s) and counseled on treatment options.  The patient appeared to understand what I have discussed and is in agreement with it.  Encounter Diagnosis  Name Primary?  . Other cervical disc degeneration, unspecified cervical region     PLAN Call if any problems.  Precautions discussed.  Continue current medications.   Return to clinic MRI of cervical spine and soft tissues with and without contrast.  Return after this.   Electronically Signed Sanjuana Kava, MD 10/9/20193:28 PM

## 2018-08-15 ENCOUNTER — Ambulatory Visit (HOSPITAL_COMMUNITY): Payer: Medicare Other

## 2018-08-15 ENCOUNTER — Encounter: Payer: Self-pay | Admitting: Orthopaedic Surgery

## 2018-08-15 DIAGNOSIS — I10 Essential (primary) hypertension: Secondary | ICD-10-CM | POA: Diagnosis not present

## 2018-08-15 DIAGNOSIS — Z23 Encounter for immunization: Secondary | ICD-10-CM | POA: Diagnosis not present

## 2018-08-18 IMAGING — MR MR LUMBAR SPINE W/O CM
4 of 5 series · 15 of 48 positions shown · non-contrast
Comparison: Radiographs dated 06/25/2017 and CT scan of the abdomen
and pelvis dated 03/25/2013

CLINICAL DATA: Chronic right-sided low back pain with right-sided
sciatica. Previous surgery.

EXAM:
MRI LUMBAR SPINE WITHOUT CONTRAST
TECHNIQUE: Multiplanar, multisequence MR imaging of the lumbar spine was
performed. No intravenous contrast was administered.

[Series 3: T2 · sagittal · 4.0mm · 0.71mm/px · 6 of 17 slices shown (1 of 2)]
[im 1/17]
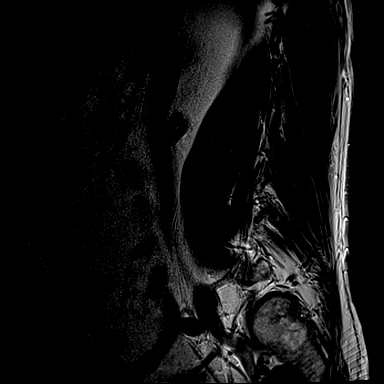
[im 4/17]
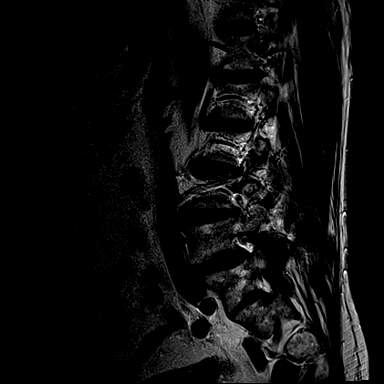
[im 7/17]
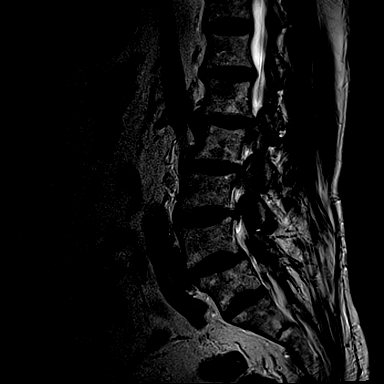
[im 10/17]
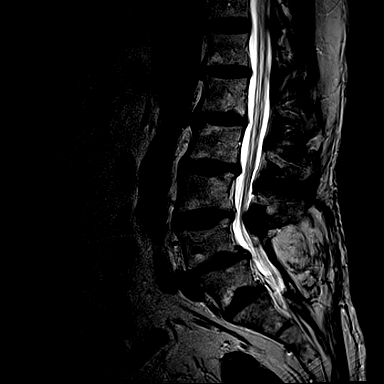
[im 13/17]
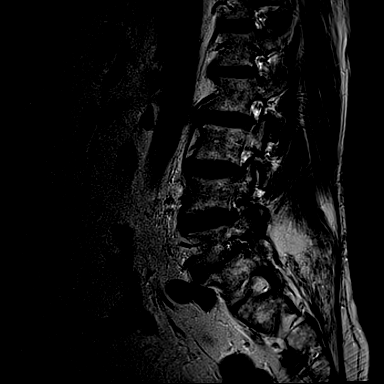
[im 17/17]
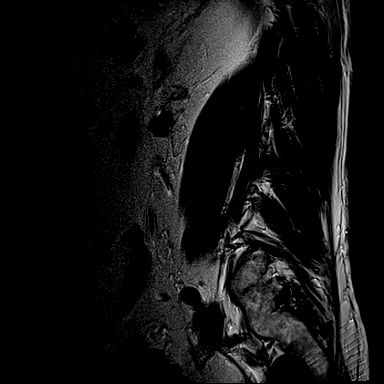

[Series 4: T1 · sagittal · 4.0mm · 0.35mm/px · 3 of 17 slices shown (1 of 2)]
[im 3/17]
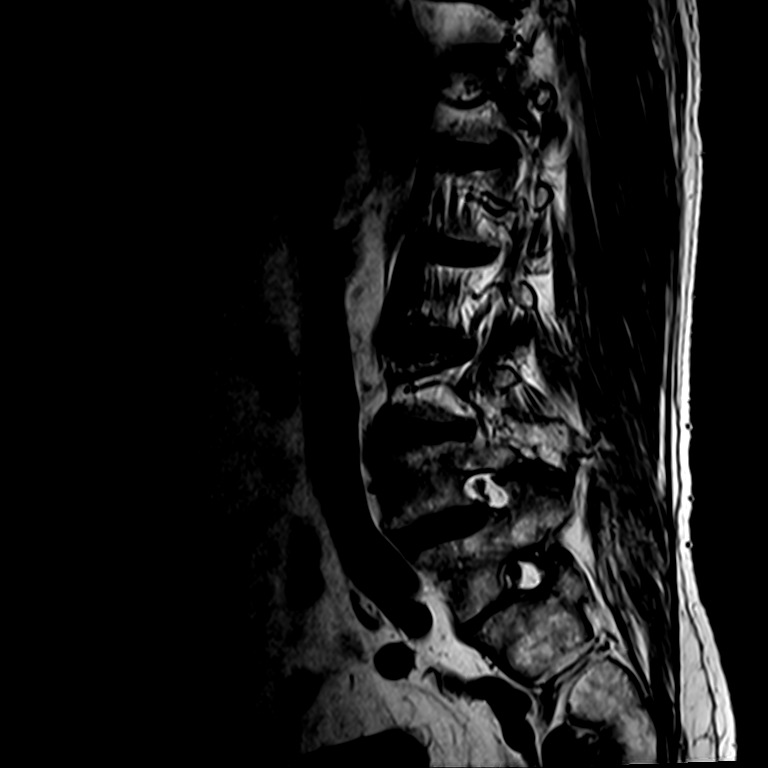
[im 9/17]
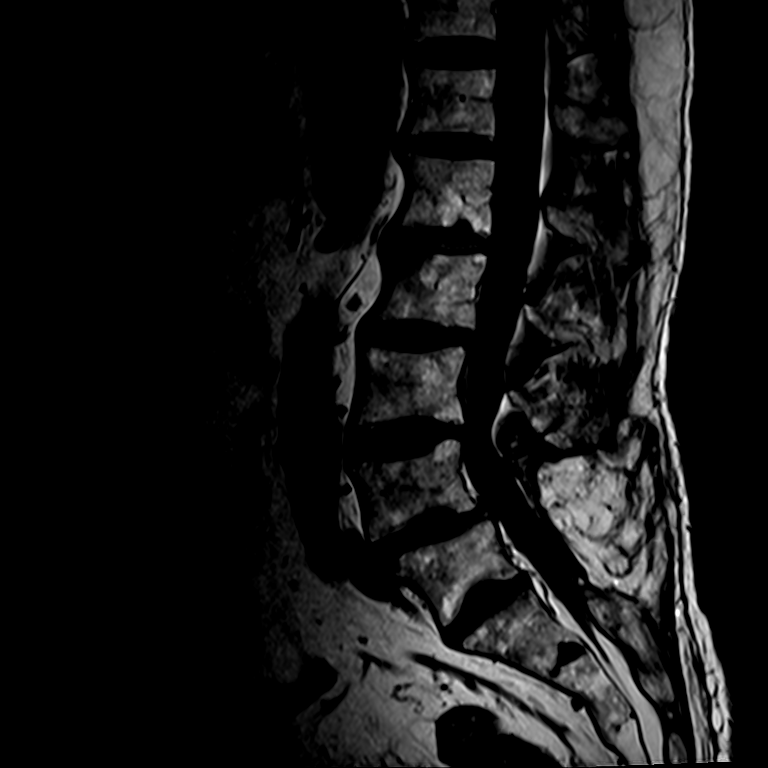
[im 14/17]
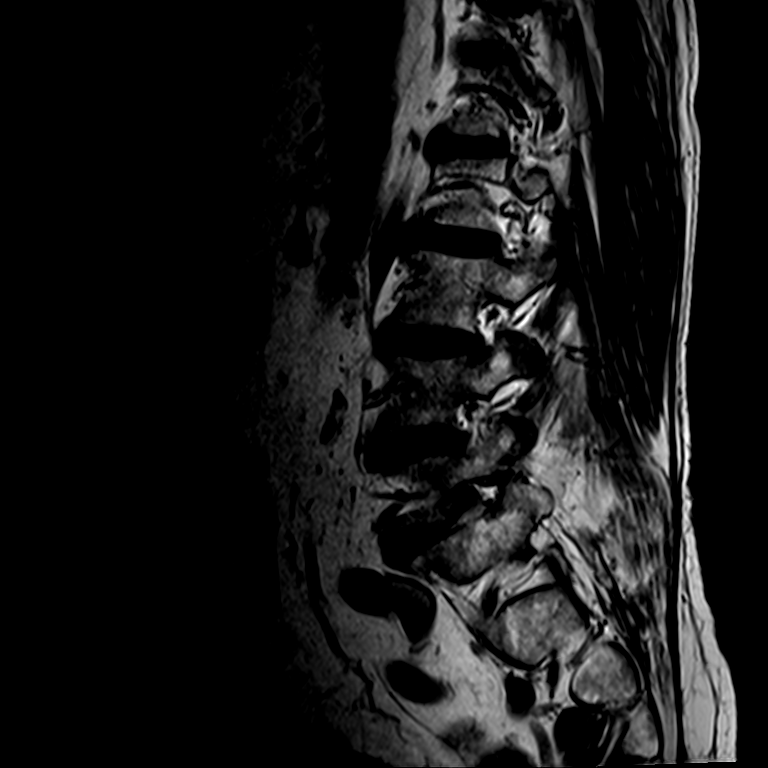

[Series 6: T2 · axial · 4.0mm · 0.25mm/px · z∈[-100,+21]mm · 3 of 37 slices shown (2 of 2)]
[im 6/37]
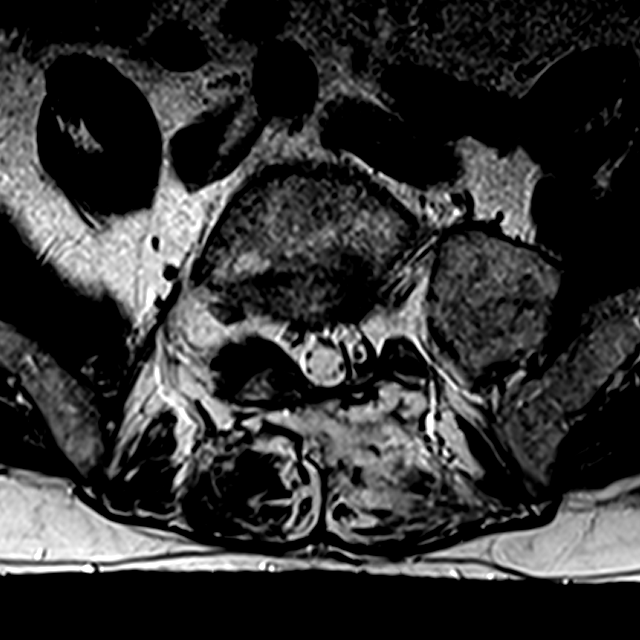
[im 20/37]
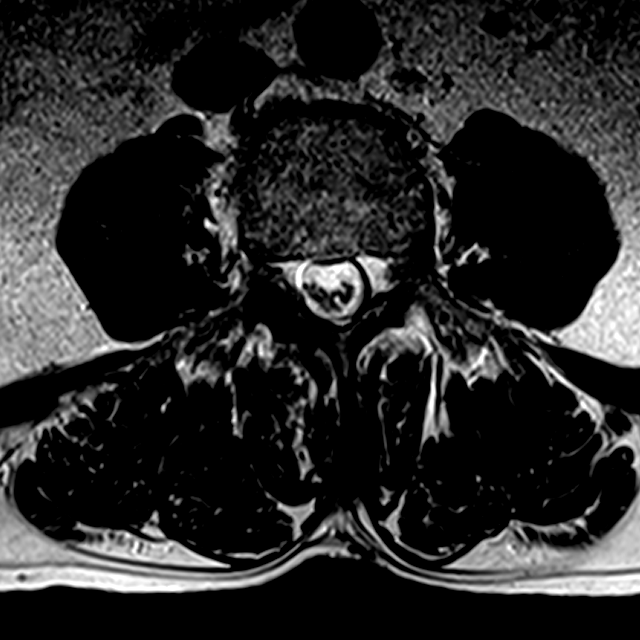
[im 31/37]
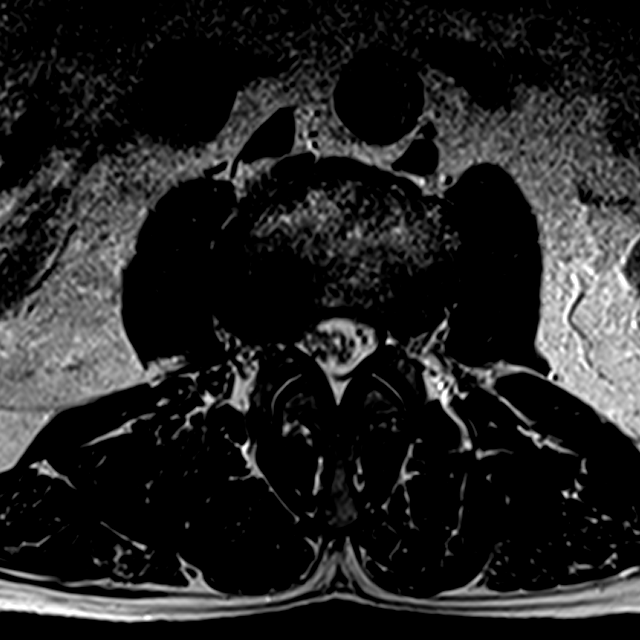

[Series 7: T1 · axial · 4.0mm · 0.25mm/px · z∈[-100,+21]mm · 3 of 37 slices shown (2 of 2)]
[im 6/37]
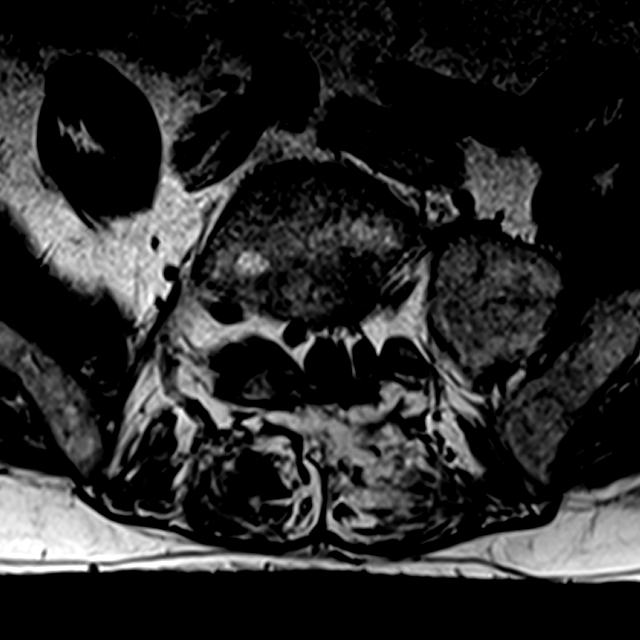
[im 20/37]
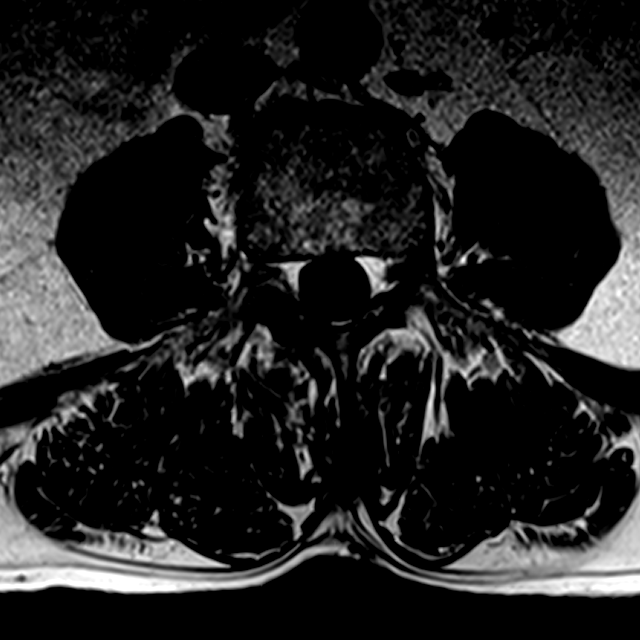
[im 31/37]
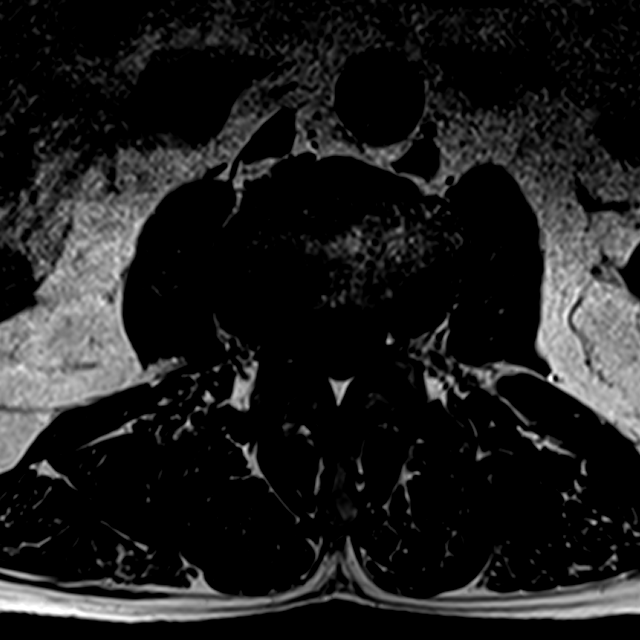

[15 of 48 positions shown; findings below may reference images not displayed]

FINDINGS: Segmentation: 5 typical lumbar segments. Partial lumbarization of S1
with a vestigial disc at S1-2. However, the S1 segment is firmly
congenitally fused with remainder of the sacrum.

Alignment:  2.8 mm spondylolisthesis of L5 on S1.

Vertebrae: No fracture, evidence of discitis, or bone lesion.
Evidence of previous posterior decompression at L5-S1 centrally and
to the left.

Conus medullaris: Extends to the L1-2 level and appears normal.

Paraspinal and other soft tissues: Negative.

Disc levels:

L1-2: Tiny broad-based disc bulge with no neural impingement. Disc
desiccation.

L2-3: Disc desiccation. Small broad-based disc bulge with no neural
impingement. Minimal compression of the thecal sac. The bulge is
asymmetric into the right neural foramen but the right L2 nerve
exits without impingement.

L3-4: Small broad-based disc bulge with accompanying osteophytes
slightly asymmetric to the right symmetrically compressing the
thecal sac creating mild spinal stenosis without focal neural
impingement.

L4-5: Disc desiccation. Minimal broad-based disc bulge with
accompanying osteophytes. Marked hypertrophy of the ligamentum
flavum creates moderate spinal stenosis without focal neural
impingement.

L5-S1: Grade 1 spondylolisthesis. Disc bulging osteophytes extend
into the left lateral recess compressing the left side of the thecal
sac. Posterior decompression asymmetric to the left. Severe left
foraminal stenosis which could affect the left L5 nerve. Hypertrophy
of the right facet joint and ligamentum flavum without neural
impingement.
IMPRESSION: 1. Moderate spinal stenosis at L4-5 primarily due to hypertrophy of
the ligamentum flavum.
2. Mild spinal stenosis at L3-4.
3. Severe left foraminal stenosis at L5-S1.

## 2018-08-19 ENCOUNTER — Telehealth: Payer: Self-pay | Admitting: Internal Medicine

## 2018-08-19 ENCOUNTER — Telehealth: Payer: Self-pay | Admitting: Orthopaedic Surgery

## 2018-08-19 ENCOUNTER — Ambulatory Visit (HOSPITAL_COMMUNITY)
Admission: RE | Admit: 2018-08-19 | Discharge: 2018-08-19 | Disposition: A | Discharge status: Home / Self Care | Payer: Medicare Other | Source: Ambulatory Visit | Attending: Orthopaedic Surgery | Admitting: Orthopaedic Surgery

## 2018-08-19 ENCOUNTER — Other Ambulatory Visit: Payer: Self-pay | Admitting: Orthopaedic Surgery

## 2018-08-19 DIAGNOSIS — R221 Localized swelling, mass and lump, neck: Secondary | ICD-10-CM | POA: Diagnosis not present

## 2018-08-19 DIAGNOSIS — M4802 Spinal stenosis, cervical region: Secondary | ICD-10-CM | POA: Insufficient documentation

## 2018-08-19 DIAGNOSIS — M5030 Other cervical disc degeneration, unspecified cervical region: Secondary | ICD-10-CM | POA: Diagnosis not present

## 2018-08-19 LAB — POCT I-STAT CREATININE: Creatinine, Ser: 1.1 mg/dL (ref 0.61–1.24)

## 2018-08-19 MED ORDER — GADOBUTROL 1 MMOL/ML IV SOLN
10.0000 mL | Freq: Once | INTRAVENOUS | Status: AC | PRN
  Administered 2018-08-19: 10 mL via INTRAVENOUS

## 2018-08-19 NOTE — Telephone Encounter (Signed)
Spot opened and he is on it.

## 2018-08-19 NOTE — Telephone Encounter (Signed)
Patient needs an EGD and likely dilation  He has been told procedures need to be done in hospital.  Per conversations with Osvaldo Angst, CRNA and my investigation - none of the drugs we use in [place] can cause malignant hyperthermia so it is safe to do him there.  Spoke with daughter - patient has been told for years he needs to be done in hospital setting.  She will explain to patient and I have offered to speak to him and I know Osvaldo Angst would if needed.  Plan is to do at [facility] Oct 29 - open that up please   Do not call [name] until I have a chance to hear back from his daughter.  I also want to explain to previsit staff about this so they are 100% clear

## 2018-08-19 NOTE — Telephone Encounter (Signed)
Call received from Audelia Acton [facility] radiology, has question on MRI order - states patient is being started; please advise. Shane's direct ph# [phone]

## 2018-08-19 NOTE — Telephone Encounter (Signed)
He should have mri with and without contrast 1 study

## 2018-08-19 NOTE — Telephone Encounter (Signed)
I reviewed order, study is ordered as cervical spine with and without contrast He has a soft tissue mass Dr Luna Glasgow is concerned about  And looks like he needs cervical spine also for neck pain (due to the contrast he can not have both cervical spine and soft tissues neck in one day) I discussed with MRI/ Audelia Acton His most pressing concern is the soft tissue area, so Audelia Acton will proceed with the soft tissue MRI neck today, and the MRI cervical will need to be imaged at a later date.   Will you review the Soft tissue neck MRI and the notes, and verify for me he does in fact need the MRI cervical spine? If so, I will call to schedule

## 2018-08-19 NOTE — Telephone Encounter (Signed)
OK to call him - he is aware of plan  Please point out his special issues to previsit so they understand and know this is not a problem (malignant hyperthermia) oin the [place]

## 2018-08-20 NOTE — Telephone Encounter (Signed)
Patient notified of appt details.  He is notified to arrive at 12:00 on 08/26/18.  He verbalized understanding that me may have clear  Liquid up until 10:00

## 2018-08-22 ENCOUNTER — Other Ambulatory Visit: Payer: Self-pay

## 2018-08-22 ENCOUNTER — Encounter (HOSPITAL_COMMUNITY): Payer: Self-pay | Admitting: *Deleted

## 2018-08-22 ENCOUNTER — Telehealth: Payer: Self-pay | Admitting: Internal Medicine

## 2018-08-22 NOTE — Telephone Encounter (Signed)
Set up for [place] EGD 10/28 He is aware to arrive NPO and at 0600  Messaged daughter also

## 2018-08-22 NOTE — Telephone Encounter (Signed)
-----   Message from Marlon Pel, RN sent at 08/22/2018  8:47 AM EDT ----- Regarding: RE: move to hospital He is on for 7:30 on 08/25/18.  NPO after midnight.  Arrive at 6:00 am admitting.   Sheri ----- Message ----- From: Gatha Mayer, MD Sent: 08/22/2018   7:41 AM EDT To: Marlon Pel, RN Subject: move to hospital                               OK - lots of back and forth but after looking at this again need to do at hospital we were going to get dantrolene to be extra safe but ran into problems  Looks like 0730 Monday 10/28 is available at [facility] from master schedule view  Please grab that if ok, cancel in [place] anyway  I will call Mr. Sharp and explain to him once I know you have done this and I guess he may need some other instructions which I am happy to pass along to him and his daughter

## 2018-08-22 NOTE — Progress Notes (Signed)
Dr Suzette Battiest ( anesthesia)  made aware of patient hx of Malignant Hyperthermia.  Informed D rRobert  Fitzgerald ( anesthesia) that Diagnosis is listed in Medical History under anesthesia complications and in Special Needs Column on Endo Schedule.

## 2018-08-25 ENCOUNTER — Ambulatory Visit (HOSPITAL_COMMUNITY): Payer: Medicare Other | Admitting: Certified Registered Nurse Anesthetist

## 2018-08-25 ENCOUNTER — Other Ambulatory Visit: Payer: Self-pay

## 2018-08-25 ENCOUNTER — Encounter (HOSPITAL_COMMUNITY)
Admission: RE | Disposition: A | Discharge status: Home / Self Care | Payer: Self-pay | Source: Ambulatory Visit | Attending: Internal Medicine

## 2018-08-25 ENCOUNTER — Encounter (HOSPITAL_COMMUNITY): Payer: Self-pay | Admitting: *Deleted

## 2018-08-25 ENCOUNTER — Ambulatory Visit (HOSPITAL_COMMUNITY)
Admission: RE | Admit: 2018-08-25 | Discharge: 2018-08-25 | Disposition: A | Discharge status: Home / Self Care | Payer: Medicare Other | Source: Ambulatory Visit | Attending: Internal Medicine | Admitting: Internal Medicine

## 2018-08-25 DIAGNOSIS — I10 Essential (primary) hypertension: Secondary | ICD-10-CM | POA: Diagnosis not present

## 2018-08-25 DIAGNOSIS — K3189 Other diseases of stomach and duodenum: Secondary | ICD-10-CM | POA: Diagnosis not present

## 2018-08-25 DIAGNOSIS — B9681 Helicobacter pylori [H. pylori] as the cause of diseases classified elsewhere: Secondary | ICD-10-CM | POA: Insufficient documentation

## 2018-08-25 DIAGNOSIS — Z8619 Personal history of other infectious and parasitic diseases: Secondary | ICD-10-CM | POA: Insufficient documentation

## 2018-08-25 DIAGNOSIS — R1310 Dysphagia, unspecified: Secondary | ICD-10-CM | POA: Diagnosis not present

## 2018-08-25 DIAGNOSIS — Z79899 Other long term (current) drug therapy: Secondary | ICD-10-CM | POA: Diagnosis not present

## 2018-08-25 DIAGNOSIS — Z87891 Personal history of nicotine dependence: Secondary | ICD-10-CM | POA: Diagnosis not present

## 2018-08-25 DIAGNOSIS — R1319 Other dysphagia: Secondary | ICD-10-CM

## 2018-08-25 DIAGNOSIS — E162 Hypoglycemia, unspecified: Secondary | ICD-10-CM | POA: Diagnosis not present

## 2018-08-25 HISTORY — PX: MALONEY DILATION: SHX5535

## 2018-08-25 HISTORY — PX: ESOPHAGOGASTRODUODENOSCOPY (EGD) WITH PROPOFOL: SHX5813

## 2018-08-25 HISTORY — PX: BIOPSY: SHX5522

## 2018-08-25 LAB — CLOTEST (H. PYLORI), BIOPSY: Helicobacter screen: POSITIVE — AB

## 2018-08-25 SURGERY — ESOPHAGOGASTRODUODENOSCOPY (EGD) WITH PROPOFOL
Anesthesia: Monitor Anesthesia Care

## 2018-08-25 MED ORDER — PROPOFOL 10 MG/ML IV BOLUS
INTRAVENOUS | Status: AC
  Filled 2018-08-25: qty 40

## 2018-08-25 MED ORDER — PROPOFOL 500 MG/50ML IV EMUL
INTRAVENOUS | Status: DC | PRN
  Administered 2018-08-25: 120 ug/kg/min via INTRAVENOUS

## 2018-08-25 MED ORDER — LACTATED RINGERS IV SOLN
INTRAVENOUS | Status: DC
  Administered 2018-08-25: 1000 mL via INTRAVENOUS

## 2018-08-25 MED ORDER — PROPOFOL 10 MG/ML IV BOLUS
INTRAVENOUS | Status: DC | PRN
  Administered 2018-08-25: 20 mg via INTRAVENOUS

## 2018-08-25 SURGICAL SUPPLY — 15 items

## 2018-08-25 NOTE — Anesthesia Preprocedure Evaluation (Signed)
Anesthesia Evaluation  Patient identified by MRN, date of birth, ID band Patient awake    Reviewed: Allergy & Precautions, NPO status , Patient's Chart, lab work & pertinent test results  History of Anesthesia Complications (+) MALIGNANT HYPERTHERMIA  Airway Mallampati: II  TM Distance: >3 FB Neck ROM: Full    Dental no notable dental hx.    Pulmonary neg pulmonary ROS, former smoker,    Pulmonary exam normal breath sounds clear to auscultation       Cardiovascular hypertension, Normal cardiovascular exam Rhythm:Regular Rate:Normal     Neuro/Psych negative neurological ROS  negative psych ROS   GI/Hepatic negative GI ROS, Neg liver ROS,   Endo/Other  negative endocrine ROS  Renal/GU negative Renal ROS  negative genitourinary   Musculoskeletal negative musculoskeletal ROS (+)   Abdominal   Peds negative pediatric ROS (+)  Hematology negative hematology ROS (+)   Anesthesia Other Findings   Reproductive/Obstetrics negative OB ROS                             Anesthesia Physical Anesthesia Plan  ASA: II  Anesthesia Plan: MAC   Post-op Pain Management:    Induction: Intravenous  PONV Risk Score and Plan: 0  Airway Management Planned: Simple Face Mask  Additional Equipment:   Intra-op Plan:   Post-operative Plan:   Informed Consent: I have reviewed the patients History and Physical, chart, labs and discussed the procedure including the risks, benefits and alternatives for the proposed anesthesia with the patient or authorized representative who has indicated his/her understanding and acceptance.   Dental advisory given  Plan Discussed with: CRNA and Surgeon  Anesthesia Plan Comments:         Anesthesia Quick Evaluation

## 2018-08-25 NOTE — Anesthesia Postprocedure Evaluation (Signed)
Anesthesia Post Note  Patient: Tracy Pratt  Procedure(s) Performed: ESOPHAGOGASTRODUODENOSCOPY (EGD) WITH PROPOFOL (N/A ) [place] DILATION     Patient location during evaluation: PACU Anesthesia Type: MAC Level of consciousness: awake and alert Pain management: pain level controlled Vital Signs Assessment: post-procedure vital signs reviewed and stable Respiratory status: spontaneous breathing, nonlabored ventilation, respiratory function stable and patient connected to nasal cannula oxygen Cardiovascular status: stable and blood pressure returned to baseline Postop Assessment: no apparent nausea or vomiting Anesthetic complications: no    Last Vitals:  Vitals:   08/25/18 0800 08/25/18 0810  BP: (!) 101/56 116/83  Pulse: (!) 48 (!) 54  Resp: 14 (!) 21  Temp:    SpO2: 99% 100%    Last Pain:  Vitals:   08/25/18 0810  TempSrc:   PainSc: 0-No pain                 Gizella Belleville S

## 2018-08-25 NOTE — Discharge Instructions (Signed)
° °  I did not see any abnormalities - slight redness in the stomach but that is common.  I took biopsies to see if the H pylori infection is gone.  I dilated the esophagus.  I hope you keep swallowing ok.  Will let you know about the biopsies later this week,.  I appreciate the opportunity to care for you. Gatha Mayer, MD, FACG  YOU HAD AN ENDOSCOPIC PROCEDURE TODAY: Refer to the procedure report and other information in the discharge instructions given to you for any specific questions about what was found during the examination. If this information does not answer your questions, please call Dr. Celesta Aver office at [phone] to clarify.   YOU SHOULD EXPECT: Some feelings of bloating in the abdomen. Passage of more gas than usual. Walking can help get rid of the air that was put into your GI tract during the procedure and reduce the bloating. If you had a lower endoscopy (such as a colonoscopy or flexible sigmoidoscopy) you may notice spotting of blood in your stool or on the toilet paper. Some abdominal soreness may be present for a day or two, also.  DIET:   Clear liquids only until 0900 then soft foods today - normal foods tomorrow.   ACTIVITY: Your care partner should take you home directly after the procedure. You should plan to take it easy, moving slowly for the rest of the day. You can resume normal activity the day after the procedure however YOU SHOULD NOT DRIVE, use power tools, machinery or perform tasks that involve climbing or major physical exertion for 24 hours (because of the sedation medicines used during the test).   SYMPTOMS TO REPORT IMMEDIATELY: A gastroenterologist can be reached at any hour. Please call [phone]  for any of the following symptoms:   Following upper endoscopy (EGD, EUS, ERCP, esophageal dilation) Vomiting of blood or coffee ground material  New, significant abdominal pain  New, significant chest pain or pain under the shoulder  blades  Painful or persistently difficult swallowing  New shortness of breath  Black, tarry-looking or red, bloody stools

## 2018-08-25 NOTE — Transfer of Care (Signed)
Immediate Anesthesia Transfer of Care Note  Patient: Tracy Pratt  Procedure(s) Performed: ESOPHAGOGASTRODUODENOSCOPY (EGD) WITH PROPOFOL (N/A ) [place] DILATION  Patient Location: PACU and Endoscopy Unit  Anesthesia Type:MAC  Level of Consciousness: awake, alert , oriented and patient cooperative  Airway & Oxygen Therapy: Patient Spontanous Breathing and Patient connected to face mask oxygen  Post-op Assessment: Report given to RN, Post -op Vital signs reviewed and stable and Patient moving all extremities  Post vital signs: Reviewed and stable  Last Vitals:  Vitals Value Taken Time  BP 101/56 08/25/2018  8:00 AM  Temp 36.6 C 08/25/2018  7:59 AM  Pulse 48 08/25/2018  8:00 AM  Resp 14 08/25/2018  8:00 AM  SpO2 99 % 08/25/2018  8:00 AM  Vitals shown include unvalidated device data.  Last Pain:  Vitals:   08/25/18 0759  TempSrc: Oral  PainSc: 0-No pain         Complications: No apparent anesthesia complications

## 2018-08-25 NOTE — Op Note (Signed)
[facility] Patient Name: Tracy Pratt Procedure Date: 08/25/2018 MRN: [id number] Attending MD: Gatha Mayer , MD Date of Birth: 03-21-1931 CSN: [id number] Age: 84 Admit Type: Outpatient Procedure:                Upper GI endoscopy Indications:              Dysphagia Providers:                Gatha Mayer, MD, Cleda Daub, RN, Charolette Child, Technician, Courtney Heys [place], CRNA Referring MD:             [name] MD, MD, Willette Pa, MD Medicines:                Propofol per Anesthesia, Monitored Anesthesia Care Complications:            No immediate complications. Estimated Blood Loss:     Estimated blood loss was minimal. Procedure:                Pre-Anesthesia Assessment:                           - Prior to the procedure, a History and Physical                            was performed, and patient medications and                            allergies were reviewed. The patient's tolerance of                            previous anesthesia was also reviewed. The risks                            and benefits of the procedure and the sedation                            options and risks were discussed with the patient.                            All questions were answered, and informed consent                            was obtained. Prior Anticoagulants: The patient has                            taken no previous anticoagulant or antiplatelet                            agents. ASA Grade Assessment: II - A patient with                            mild systemic disease. After reviewing the risks  and benefits, the patient was deemed in                            satisfactory condition to undergo the procedure.                           After obtaining informed consent, the endoscope was                            passed under direct vision. Throughout the                            procedure, the  patient's blood pressure, pulse, and                            oxygen saturations were monitored continuously. The                            GIF-H190 ([id number] Olympus adult endoscope was                            introduced through the mouth, and advanced to the                            second part of duodenum. The upper GI endoscopy was                            accomplished without difficulty. The patient                            tolerated the procedure well. Scope In: Scope Out: Findings:      No endoscopic abnormality was evident in the esophagus to explain the       patient's complaint of dysphagia. It was decided, however, to proceed       with dilation of the entire esophagus. The scope was withdrawn. Dilation       was performed with a Maloney dilator with mild resistance at 68 Fr. The       dilation site was examined following endoscope reinsertion and showed no       change. Estimated blood loss: none.      Patchy mildly erythematous mucosa without bleeding was found in the       gastric antrum. Biopsies were taken with a cold forceps for Helicobacter       pylori testing using CLOtest. Verification of patient identification for       the specimen was done. Estimated blood loss was minimal.      The examined duodenum was normal.      The cardia and gastric fundus were normal on retroflexion. Impression:               - No endoscopic esophageal abnormality to explain                            patient's dysphagia. Esophagus dilated. Dilated  because of dysphagia and prominent cricopharyngeus                            on Ba swallow                           - Erythematous mucosa in the antrum. Biopsied. CLO                            test - evaluate for successful eradication of H                            pylori (past infection and treatment)                           - Normal examined duodenum. Moderate Sedation:      N/A- Per Anesthesia  Care Recommendation:           - Patient has a contact number available for                            emergencies. The signs and symptoms of potential                            delayed complications were discussed with the                            patient. Return to normal activities tomorrow.                            Written discharge instructions were provided to the                            patient.                           - Clear liquids x 1 hour then soft foods rest of                            day. Start prior diet tomorrow.                           - Continue present medications.                           - Await pathology results. Procedure Code(s):        --- Professional ---                           [phone], Esophagogastroduodenoscopy, flexible,                            transoral; with biopsy, single or multiple                           43450, Dilation of esophagus, by unguided sound or  bougie, single or multiple passes Diagnosis Code(s):        --- Professional ---                           R13.10, Dysphagia, unspecified                           K31.89, Other diseases of stomach and duodenum CPT copyright 2018 [employer] Association. All rights reserved. The codes documented in this report are preliminary and upon coder review may  be revised to meet current compliance requirements. Gatha Mayer, MD 08/25/2018 8:11:14 AM This report has been signed electronically. Number of Addenda: 0

## 2018-08-25 NOTE — Interval H&P Note (Signed)
History and Physical Interval Note:  08/25/2018 7:28 AM  Tracy Pratt  has presented today for surgery, with the diagnosis of dyspagia  The various methods of treatment have been discussed with the patient and family. After consideration of risks, benefits and other options for treatment, the patient has consented to  Procedure(s): ESOPHAGOGASTRODUODENOSCOPY (EGD) WITH PROPOFOL (N/A) as a surgical intervention .  The patient's history has been reviewed, patient examined, no change in status, stable for surgery.  I have reviewed the patient's chart and labs.  Questions were answered to the patient's satisfaction.     Silvano Rusk

## 2018-08-26 ENCOUNTER — Encounter (HOSPITAL_COMMUNITY): Payer: Self-pay | Admitting: Internal Medicine

## 2018-08-26 ENCOUNTER — Encounter: Payer: Medicare Other | Admitting: Internal Medicine

## 2018-08-27 ENCOUNTER — Ambulatory Visit (INDEPENDENT_AMBULATORY_CARE_PROVIDER_SITE_OTHER): Payer: Medicare Other | Admitting: Orthopaedic Surgery

## 2018-08-27 ENCOUNTER — Encounter: Payer: Self-pay | Admitting: Orthopaedic Surgery

## 2018-08-27 VITALS — BP 141/77 | HR 61 | Ht 69.5 in | Wt 215.0 lb

## 2018-08-27 DIAGNOSIS — M5030 Other cervical disc degeneration, unspecified cervical region: Secondary | ICD-10-CM

## 2018-08-27 NOTE — Progress Notes (Signed)
Patient Tracy Pratt, male DOB:02-08-1931, 84 y.o. AJG:[id number]  Chief Complaint  Patient presents with  . Neck Pain    MRI and lab results    HPI  Tracy Pratt is a 84 y.o. male who has had neck pain on the left and left jaw pain and pain near the left ear.  He had a MRI of this area and it showed: IMPRESSION: 1. The palpable area of concern corresponds to the left parotid gland which appears normal. 2. Normal MRI appearance of the neck soft tissues. 3. 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  I will have him see an ENT.  He has less pain and swelling today and near normal neck motion today.  He says he is much better.   Body mass index is 31.29 kg/m.  ROS  Review of Systems  Constitutional: Positive for activity change.       Patient does not have Diabetes Mellitus. Patient has hypertension. Patient does not have COPD or shortness of breath. Patient does not have BMI > 35. Patient does not have current smoking history.  HENT: Negative for congestion.   Eyes:       Post cataract surgery, doing well   Respiratory: Negative for cough and shortness of breath.   Cardiovascular: Negative for chest pain and leg swelling.       Malignant hyperthermia with anesthesia  Endocrine: Negative for cold intolerance.  Genitourinary:       Post prostate cancer with treatment, doing well.  Musculoskeletal: Positive for arthralgias (prior of knees and shoulders, resolved for now, recurrent), back pain and neck pain.  Allergic/Immunologic: Negative for environmental allergies.  All other systems reviewed and are negative.   All other systems reviewed and are negative.  The following is a summary of the past history medically, past history surgically, known current medicines, social history and family history.  This information is gathered electronically by the computer from prior information and documentation.  I  review this each visit and have found including this information at this point in the chart is beneficial and informative.    Past Medical History:  Diagnosis Date  . Adrenal adenoma    Left  . Arthritis   . BPH (benign prostatic hyperplasia)   . Cataract 2007, 2009   Bilateral  . Colon polyps   . Essential hypertension   . Malignant hyperthermia   . Prostate cancer ([place]) 08/08/2007   Seed Implant and XRT  . Spondylolisthesis    L5-S1    Past Surgical History:  Procedure Laterality Date  . APPENDECTOMY  1940  . BACK SURGERY  1979  . BIOPSY  08/25/2018   Procedure: BIOPSY;  Surgeon: Gatha Mayer, MD;  [facility]: [facility];  Service: Endoscopy;;  . CATARACT EXTRACTION  2007  . CORNEAL TRANSPLANT  2007  . ESOPHAGOGASTRODUODENOSCOPY (EGD) WITH PROPOFOL N/A 08/25/2018   Procedure: ESOPHAGOGASTRODUODENOSCOPY (EGD) WITH PROPOFOL;  Surgeon: Gatha Mayer, MD;  [facility];  Service: Endoscopy;  Laterality: N/A;  . KNEE SURGERY  1996   left knee arthoscopic  . MALONEY DILATION  08/25/2018   Procedure: Venia Minks DILATION;  Surgeon: Gatha Mayer, MD;  [facility];  Service: Endoscopy;;  . ROTATOR CUFF REPAIR  2015  . Torn meniscus left knee  1996  . TRANSURETHRAL RESECTION OF PROSTATE  1997    Family History  Problem Relation Age of Onset  . Prostate cancer Brother   .  Lung disease Brother        lobectomy    Social History Social History   Tobacco Use  . Smoking status: Former Smoker    Packs/day: 1.00    Years: 15.00    Pack years: 15.00    Types: Cigarettes  . Smokeless tobacco: Never Used  . Tobacco comment: quit 28 years ago  Substance Use Topics  . Alcohol use: No  . Drug use: No    Allergies  Allergen Reactions  . Diclofenac Sodium Anaphylaxis  . Nabumetone Anaphylaxis  . Nsaids Anaphylaxis  . Tolmetin Anaphylaxis  . Bee Venom Swelling  . Desflurane Other (See Comments)    Malignant hyperthermia  . Enflurane Other (See  Comments)    Malignant hyperthermia  . Halothane Other (See Comments)    Malignant hyperthermia  . Isoflurane Other (See Comments)    Malignant hyperthermia  . Sevoflurane Other (See Comments)    Malignant hyperthermia  . Succinylcholine Other (See Comments)    Malignant hyperthermia    Current Outpatient Medications  Medication Sig Dispense Refill  . fluticasone (FLONASE) 50 MCG/ACT nasal spray Place 1 spray into both nostrils daily as needed for allergies.     . furosemide (LASIX) 40 MG tablet Take 40 mg by mouth daily as needed for fluid.     Marland Kitchen HYDROcodone-acetaminophen (NORCO) 7.5-325 MG tablet TAKE ONE TABLET BY MOUTH EVERY 4 HOURS AS NEEDED FOR MODERATE PAIN. MUST LAST 30 DAYS. 120 tablet 0  . lisinopril-hydrochlorothiazide (PRINZIDE,ZESTORETIC) 10-12.5 MG tablet Take 1 tablet by mouth daily.    Marland Kitchen loteprednol (LOTEMAX) 0.5 % ophthalmic suspension Place 1 drop into both eyes at bedtime.      No current facility-administered medications for this visit.      Physical Exam  Blood pressure (!) 141/77, pulse 61, height 5' 9.5" (1.765 m), weight 215 lb (97.5 kg).  Constitutional: overall normal hygiene, normal nutrition, well developed, normal grooming, normal body habitus. Assistive device:none  Musculoskeletal: gait and station Limp none, muscle tone and strength are normal, no tremors or atrophy is present.  .  Neurological: coordination overall normal.  Deep tendon reflex/nerve stretch intact.  Sensation normal.  Cranial nerves II-XII intact.   Skin:   Normal overall no scars, lesions, ulcers or rashes. No psoriasis.  Psychiatric: Alert and oriented x 3.  Recent memory intact, remote memory unclear.  Normal mood and affect. Well groomed.  Good eye contact.  Cardiovascular: overall no swelling, no varicosities, no edema bilaterally, normal temperatures of the legs and arms, no clubbing, cyanosis and good capillary refill.  Lymphatic: palpation is normal.  Neck has full  ROM and no swelling of the left neck area.  NV intact.  All other systems reviewed and are negative   The patient has been educated about the nature of the problem(s) and counseled on treatment options.  The patient appeared to understand what I have discussed and is in agreement with it.  Encounter Diagnosis  Name Primary?  . Other cervical disc degeneration, unspecified cervical region Yes    PLAN Call if any problems.  Precautions discussed.  Continue current medications.   Return to clinic prn   See ENT.  Electronically Signed Sanjuana Kava, MD 10/30/20193:32 PM

## 2018-09-08 DIAGNOSIS — R609 Edema, unspecified: Secondary | ICD-10-CM | POA: Diagnosis not present

## 2018-09-08 DIAGNOSIS — R1314 Dysphagia, pharyngoesophageal phase: Secondary | ICD-10-CM | POA: Diagnosis not present

## 2018-09-09 ENCOUNTER — Encounter: Payer: Self-pay | Admitting: Internal Medicine

## 2018-09-09 ENCOUNTER — Telehealth: Payer: Self-pay | Admitting: Internal Medicine

## 2018-09-09 DIAGNOSIS — B9681 Helicobacter pylori [H. pylori] as the cause of diseases classified elsewhere: Secondary | ICD-10-CM

## 2018-09-09 DIAGNOSIS — K2970 Gastritis, unspecified, without bleeding: Principal | ICD-10-CM

## 2018-09-09 HISTORY — DX: Helicobacter pylori (H. pylori) as the cause of diseases classified elsewhere: B96.81

## 2018-09-09 NOTE — Telephone Encounter (Signed)
His CLO test was +  I do not think it got routed to me  His daughter contacted me about it   He needs the following please:  1) Omeprazole 20 mg 2 times a day x 14 d 2) Pepto Bismol 2 tabs (262 mg each) 4 times a day x 14 d 3) Metronidazole 250 mg 4 times a day x 14 d 4) doxycycline 100 mg 2 times a day x 14 d  After 14 d stop omeprazole also  In 4 weeks after treatment completed do H. Pylori stool antigen - dx H. Pylori gastritis

## 2018-09-10 MED ORDER — DOXYCYCLINE HYCLATE 100 MG PO CAPS: 100.0000 mg | ORAL_CAPSULE | Freq: Two times a day (BID) | ORAL | 0 refills | Status: DC

## 2018-09-10 MED ORDER — BISMUTH SUBSALICYLATE 262 MG PO CHEW: 524.0000 mg | CHEWABLE_TABLET | Freq: Four times a day (QID) | ORAL | 0 refills | Status: DC

## 2018-09-10 MED ORDER — METRONIDAZOLE 250 MG PO TABS: 250.0000 mg | ORAL_TABLET | Freq: Four times a day (QID) | ORAL | 0 refills | Status: DC

## 2018-09-10 MED ORDER — OMEPRAZOLE 20 MG PO CPDR: 20.0000 mg | DELAYED_RELEASE_CAPSULE | Freq: Two times a day (BID) | ORAL | 0 refills | Status: DC

## 2018-09-10 NOTE — Telephone Encounter (Signed)
Patient notified rx sent New labs ordered.  He understands we will contact him when it is time to have labs.

## 2018-09-15 ENCOUNTER — Telehealth: Payer: Self-pay | Admitting: Internal Medicine

## 2018-09-15 MED ORDER — LEVOFLOXACIN 500 MG PO TABS: 500.0000 mg | ORAL_TABLET | Freq: Every day | ORAL | 0 refills | Status: DC

## 2018-09-15 MED ORDER — AMOXICILLIN 500 MG PO CAPS: 1000.0000 mg | ORAL_CAPSULE | Freq: Two times a day (BID) | ORAL | 0 refills | Status: AC

## 2018-09-15 NOTE — Telephone Encounter (Signed)
I spoke with the patient's daughter  (his VM is full and he did not answer).  She will pass on to her dad that new rx has been sent

## 2018-09-15 NOTE — Telephone Encounter (Signed)
Daughter raised ? If bismuth subsalicylate would precipitate allergic reaction as he has anaphylaxis to NSAID's  Reviewed and probably not BUT we will do the following instead:  1) Levofloxacin 500 mg daily x 14 days (new Rx) 2) Amoxicillin 1000 mg bid x 14 days (new Rx) 3) omeprazole 20 mg bid x 14 days (already rxed)  Discontinue   1) Pepto Bismol 2) Doxycycline 3) Metronidazole  Thanks  He should do the H pylori stool test 4 weeks after stopping these meds  CEG

## 2018-09-17 DIAGNOSIS — C44319 Basal cell carcinoma of skin of other parts of face: Secondary | ICD-10-CM | POA: Diagnosis not present

## 2018-09-17 DIAGNOSIS — X32XXXD Exposure to sunlight, subsequent encounter: Secondary | ICD-10-CM | POA: Diagnosis not present

## 2018-09-17 DIAGNOSIS — L570 Actinic keratosis: Secondary | ICD-10-CM | POA: Diagnosis not present

## 2018-11-04 ENCOUNTER — Other Ambulatory Visit: Payer: Self-pay

## 2018-11-05 DIAGNOSIS — H01009 Unspecified blepharitis unspecified eye, unspecified eyelid: Secondary | ICD-10-CM | POA: Diagnosis not present

## 2018-11-05 DIAGNOSIS — H1851 Endothelial corneal dystrophy: Secondary | ICD-10-CM | POA: Diagnosis not present

## 2019-03-02 DIAGNOSIS — I10 Essential (primary) hypertension: Secondary | ICD-10-CM | POA: Diagnosis not present

## 2019-03-02 DIAGNOSIS — R600 Localized edema: Secondary | ICD-10-CM | POA: Diagnosis not present

## 2019-03-02 DIAGNOSIS — C61 Malignant neoplasm of prostate: Secondary | ICD-10-CM | POA: Diagnosis not present

## 2019-03-13 ENCOUNTER — Telehealth: Payer: Self-pay | Admitting: Radiology

## 2019-03-13 NOTE — Telephone Encounter (Signed)
Patient actually stopped by the office and asked for a refill of hydrocodone 7.5mg .  [employer].

## 2019-03-16 MED ORDER — HYDROCODONE-ACETAMINOPHEN 7.5-325 MG PO TABS: ORAL_TABLET | ORAL | 0 refills | Status: DC

## 2019-03-16 NOTE — Addendum Note (Signed)
Addended by: Willette Pa on: 03/16/2019 09:22 PM   Modules accepted: Orders

## 2019-05-19 DIAGNOSIS — Z8546 Personal history of malignant neoplasm of prostate: Secondary | ICD-10-CM | POA: Diagnosis not present

## 2019-05-19 DIAGNOSIS — C61 Malignant neoplasm of prostate: Secondary | ICD-10-CM | POA: Diagnosis not present

## 2019-05-19 DIAGNOSIS — K6289 Other specified diseases of anus and rectum: Secondary | ICD-10-CM | POA: Diagnosis not present

## 2019-05-25 DIAGNOSIS — Z1389 Encounter for screening for other disorder: Secondary | ICD-10-CM | POA: Diagnosis not present

## 2019-05-25 DIAGNOSIS — Z6831 Body mass index (BMI) 31.0-31.9, adult: Secondary | ICD-10-CM | POA: Diagnosis not present

## 2019-05-25 DIAGNOSIS — K644 Residual hemorrhoidal skin tags: Secondary | ICD-10-CM | POA: Diagnosis not present

## 2019-05-25 DIAGNOSIS — E6609 Other obesity due to excess calories: Secondary | ICD-10-CM | POA: Diagnosis not present

## 2019-05-25 DIAGNOSIS — J301 Allergic rhinitis due to pollen: Secondary | ICD-10-CM | POA: Diagnosis not present

## 2019-06-03 ENCOUNTER — Ambulatory Visit (HOSPITAL_COMMUNITY)
Admission: RE | Admit: 2019-06-03 | Discharge: 2019-06-03 | Disposition: A | Discharge status: Home / Self Care | Payer: Medicare Other | Source: Ambulatory Visit | Attending: Physician Assistant | Admitting: Physician Assistant

## 2019-06-03 ENCOUNTER — Other Ambulatory Visit (HOSPITAL_COMMUNITY): Payer: Self-pay | Admitting: Physician Assistant

## 2019-06-03 ENCOUNTER — Other Ambulatory Visit: Payer: Self-pay

## 2019-06-03 DIAGNOSIS — H6123 Impacted cerumen, bilateral: Secondary | ICD-10-CM | POA: Diagnosis not present

## 2019-06-03 DIAGNOSIS — Z6831 Body mass index (BMI) 31.0-31.9, adult: Secondary | ICD-10-CM | POA: Diagnosis not present

## 2019-06-03 DIAGNOSIS — E6609 Other obesity due to excess calories: Secondary | ICD-10-CM | POA: Diagnosis not present

## 2019-06-03 DIAGNOSIS — R079 Chest pain, unspecified: Secondary | ICD-10-CM | POA: Insufficient documentation

## 2019-06-03 DIAGNOSIS — R0789 Other chest pain: Secondary | ICD-10-CM | POA: Diagnosis not present

## 2019-07-02 ENCOUNTER — Other Ambulatory Visit: Payer: Self-pay

## 2019-07-02 ENCOUNTER — Ambulatory Visit (INDEPENDENT_AMBULATORY_CARE_PROVIDER_SITE_OTHER): Payer: Medicare Other | Admitting: Student

## 2019-07-02 ENCOUNTER — Encounter: Payer: Self-pay | Admitting: Student

## 2019-07-02 ENCOUNTER — Encounter: Payer: Self-pay | Admitting: *Deleted

## 2019-07-02 VITALS — BP 128/78 | HR 84 | Temp 97.8°F | Ht 69.5 in | Wt 215.0 lb

## 2019-07-02 DIAGNOSIS — R600 Localized edema: Secondary | ICD-10-CM | POA: Diagnosis not present

## 2019-07-02 DIAGNOSIS — R0609 Other forms of dyspnea: Secondary | ICD-10-CM | POA: Diagnosis not present

## 2019-07-02 DIAGNOSIS — R079 Chest pain, unspecified: Secondary | ICD-10-CM | POA: Diagnosis not present

## 2019-07-02 DIAGNOSIS — I10 Essential (primary) hypertension: Secondary | ICD-10-CM | POA: Diagnosis not present

## 2019-07-02 NOTE — Progress Notes (Signed)
Cardiology Office Note    Date:  07/02/2019   ID:  Tracy, Pratt 1931-03-18, MRN [id number]  PCP:  [name], MD  Cardiologist: Rozann Lesches, MD    Chief Complaint  Patient presents with   Follow-up    recent chest pain and fatigue    History of Present Illness:    Tracy Pratt is a 85 y.o. male with past medical history of HTN, BPH and history of prostate cancer who presents to the office today for evaluation of chest pain.  He was last examined by Dr. Domenic Polite in 06/2017 as follow-up from recent emergency department visit for near syncope. Recent echocardiogram had shown a preserved EF of 65 to 70% with no significant valvular abnormalities  A cardiac event monitor was recommended for further evaluation and showed normal sinus rhythm with some episodes of sinus bradycardia and rare PVC's but no significant pauses or arrhythmias.  In the interim, he was evaluated by his PCP and reported episodes of chest pain for the past few months. An EKG was obtained and showed normal sinus rhythm with known right bundle branch block and no acute ST changes. CXR was obtained and showed no acute cardiopulmonary abnormalities. Follow-up with Cardiology was therefore recommended for further evaluation.  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 While there, he carries heavy boxes weighing 20 to 30 pounds and has noticed he becomes more fatigued with this than previous. He did experience pain along his entire precordium when carrying these boxes which would resolve with rest. He is unsure if his pain improved with positional changes. He has not picked up heavy objects in several weeks and says his pain has now resolved but he has an "uncomfortable" feeling along his chest. Also reports having dyspnea on exertion for several months which has overall been unchanged. Says he is able to climb steps at  church (18 steps total) without any symptoms.  He denies any recent orthopnea, PND, lower extremity edema, palpitations, or presyncope. No known history of HLD or Type 2 DM. No known family history of CAD.   Past Medical History:  Diagnosis Date   Adrenal adenoma    Left   Arthritis    BPH (benign prostatic hyperplasia)    Cataract 2007, 2009   Bilateral   Colon polyps    Essential hypertension    Helicobacter pylori gastritis 09/09/2018   Malignant hyperthermia    Prostate cancer ([place]) 08/08/2007   Seed Implant and XRT   Spondylolisthesis    L5-S1    Past Surgical History:  Procedure Laterality Date   APPENDECTOMY  [facility]   BIOPSY  08/25/2018   Procedure: BIOPSY;  Surgeon: Gatha Mayer, MD;  [facility];  Service: Endoscopy;;   CATARACT EXTRACTION  2007   CORNEAL TRANSPLANT  2007   ESOPHAGOGASTRODUODENOSCOPY (EGD) WITH PROPOFOL N/A 08/25/2018   Procedure: ESOPHAGOGASTRODUODENOSCOPY (EGD) WITH PROPOFOL;  Surgeon: Gatha Mayer, MD;  [facility];  Service: Endoscopy;  Laterality: N/A;   KNEE SURGERY  1996   left knee arthoscopic   MALONEY DILATION  08/25/2018   Procedure: MALONEY DILATION;  Surgeon: Gatha Mayer, MD;  [facility];  Service: Endoscopy;;   ROTATOR CUFF REPAIR  2015   Torn meniscus left knee  1996   TRANSURETHRAL RESECTION OF PROSTATE  1997    Current Medications: Outpatient  Medications Prior to Visit  Medication Sig Dispense Refill   furosemide (LASIX) 40 MG tablet Take 40 mg by mouth daily as needed for fluid.      HYDROcodone-acetaminophen (NORCO) 7.5-325 MG tablet TAKE ONE TABLET BY MOUTH EVERY 6 HOURS AS NEEDED FOR MODERATE PAIN. 60 tablet 0   lisinopril-hydrochlorothiazide (PRINZIDE,ZESTORETIC) 10-12.5 MG tablet Take 1 tablet by mouth daily.     loteprednol (LOTEMAX) 0.5 % ophthalmic suspension Place 1 drop into both eyes at bedtime.      fluticasone (FLONASE) 50  MCG/ACT nasal spray Place 1 spray into both nostrils daily as needed for allergies.      levofloxacin (LEVAQUIN) 500 MG tablet Take 1 tablet (500 mg total) by mouth daily. 28 tablet 0   omeprazole (PRILOSEC) 20 MG capsule Take 1 capsule (20 mg total) by mouth 2 (two) times daily for 14 days. 28 capsule 0   No facility-administered medications prior to visit.      Allergies:   Diclofenac sodium, Nabumetone, Nsaids, Tolmetin, Bee venom, Desflurane, Enflurane, Halothane, Isoflurane, Sevoflurane, and Succinylcholine   Social History   Socioeconomic History   Marital status: Widowed    Spouse name: Not on file   Number of children: 2   Years of education: Not on file   Highest education level: Not on file  Occupational History   Occupation: Furniture conservator/restorer  Social Needs   Financial resource strain: Not on file   Food insecurity    Worry: Not on file    Inability: Not on file   Transportation needs    Medical: Not on file    Non-medical: Not on file  Tobacco Use   Smoking status: Former Smoker    Packs/day: 1.00    Years: 15.00    Pack years: 15.00    Types: Cigarettes   Smokeless tobacco: Never Used   Tobacco comment: quit 28 years ago  Substance and Sexual Activity   Alcohol use: No   Drug use: No   Sexual activity: Not on file  Lifestyle   Physical activity    Days per week: Not on file    Minutes per session: Not on file   Stress: Not on file  Relationships   Social connections    Talks on phone: Not on file    Gets together: Not on file    Attends religious service: Not on file    Active member of club or organization: Not on file    Attends meetings of clubs or organizations: Not on file    Relationship status: Not on file  Other Topics Concern   Not on file  Social History Narrative   Widowed   Daughter Tracy Crouch PA-C [place]   Retired Geologist, engineering   2 daughters   4 caffeine/day   Prior smoker, no EtOH/drugs      Family History:  The patient's family history includes Lung disease in his brother; Prostate cancer in his brother.   Review of Systems:   Please see the history of present illness.     General:  No chills, fever, night sweats or weight changes. Positive for fatigue.  Cardiovascular:  No edema, orthopnea, palpitations, paroxysmal nocturnal dyspnea. Positive for chest pain and dyspnea on exertion.  Dermatological: No rash, lesions/masses Respiratory: No cough, dyspnea Urologic: No hematuria, dysuria Abdominal:   No nausea, vomiting, diarrhea, bright red blood per rectum, melena, or hematemesis Neurologic:  No visual changes, wkns, changes in mental status. All other systems reviewed and are  otherwise negative except as noted above.   Physical Exam:    VS:  BP 128/78    Pulse 84    Temp 97.8 F (36.6 C)    Ht 5' 9.5" (1.765 m)    Wt 215 lb (97.5 kg)    SpO2 96%    BMI 31.29 kg/m    General: Well developed, well nourished,male appearing in no acute distress. Head: Normocephalic, atraumatic, sclera non-icteric, no xanthomas, nares are without discharge.  Neck: No carotid bruits. JVD not elevated.  Lungs: Respirations regular and unlabored, without wheezes or rales.  Heart: Regular rate and rhythm. No S3 or S4.  No murmur, no rubs, or gallops appreciated. Abdomen: Soft, non-tender, non-distended with normoactive bowel sounds. No hepatomegaly. No rebound/guarding. No obvious abdominal masses. Msk:  Strength and tone appear normal for age. No joint deformities or effusions. Extremities: No clubbing or cyanosis. No lower extremity edema.  Distal pedal pulses are 2+ bilaterally. Neuro: Alert and oriented X 3. Moves all extremities spontaneously. No focal deficits noted. Psych:  Responds to questions appropriately with a normal affect. Skin: No rashes or lesions noted  Wt Readings from Last 3 Encounters:  07/02/19 215 lb (97.5 kg)  08/27/18 215 lb (97.5 kg)  08/25/18 214 lb 15.2 oz (97.5  kg)     Studies/Labs Reviewed:   EKG:  EKG is not ordered today. EKG from PCP reviewed as outlined above.   Recent Labs: 07/27/2018: ALT 27; B Natriuretic Peptide 19.0; BUN 19; Hemoglobin 15.7; Platelets 173; Potassium 3.4; Sodium 134 08/19/2018: Creatinine, Ser 1.10   Lipid Panel    Component Value Date/Time   CHOL 202 (A) 12/17/2016    Additional studies/ records that were reviewed today include:   Echocardiogram: 05/2017 Study Conclusions  - Left ventricle: The cavity size was normal. Wall thickness was at   the upper limits of normal. Systolic function was vigorous. The   estimated ejection fraction was in the range of 65% to 70%. Wall   motion was normal; there were no regional wall motion   abnormalities. Doppler parameters are consistent with abnormal   left ventricular relaxation (grade 1 diastolic dysfunction). - Aortic valve: Mildly calcified annulus. Trileaflet. There was no   stenosis. Mean gradient (S): 6 mm Hg. Peak gradient (S): 14 mm   Hg. Valve area (VTI): 2.99 cm^2. - Mitral valve: There was trivial regurgitation. - Right atrium: Central venous pressure (est): 3 mm Hg. - Atrial septum: No defect or patent foramen ovale was identified. - Tricuspid valve: There was trivial regurgitation. - Pulmonary arteries: PA peak pressure: 25 mm Hg (S). - Pericardium, extracardiac: There was no pericardial effusion.  Impressions:  - Upper normal LV wall thickness with LVEF 65-70% and grade 1   diastolic dysfunction. Trivial mitral regurgitation. Mildly   sclerotic aortic valve without stenosis. Trivial tricuspid   regurgitation with normal estimated PASP 25 mmHg.  Event Monitor: 2018 Available strips from 14 day event recorder reviewed. Sinus rhythm present. Heart rate range reported at 43 bpm up to 114 bpm, although telemetry strip was not available for the reported event at 43 bpm. He does have sinus bradycardia noted at times with rare PVC. There were no pauses  or sustained arrhythmias.  There were no specific arrhythmias to correlate with episodes of near syncope.  Assessment:    1. Chest pain, unspecified type   2. Dyspnea on exertion   3. Essential hypertension   4. Lower extremity edema      Plan:  In order of problems listed above:  1. Chest Pain with Mixed Features/Dyspnea on Exertion  - he descries chest pain when carrying heavy objects and by his history it is difficult to tell if this was more MSK or due to exertion. He still has an "uncomfortable" sensation along his chest with associated dyspnea on exertion and fatigue if over exerting himself. Can climb a flight of stairs without symptoms.  - recent EKG from PCP showed NSR with known RBBB and no acute ST changes when compared to prior tracings. - reviewed with the patient and given his new symptoms and no recent ischemic evaluation (last stress test was 15-18+ years ago per his report), will plan for a Lexiscan Myoview.   2. HTN - BP is well-controlled at 128/78 during today's visit. Continue current regimen with Lisinopril-HCTZ 10-12.5mg  daily.   3. Lower Extremity Edema - remains well-controlled with compression stockings. Says he rarely takes PRN Lasix.    Medication Adjustments/Labs and Tests Ordered: Current medicines are reviewed at length with the patient today.  Concerns regarding medicines are outlined above.  Medication changes, Labs and Tests ordered today are listed in the Patient Instructions below. Patient Instructions  Medication Instructions:  Your physician recommends that you continue on your current medications as directed. Please refer to the Current Medication list given to you today.  If you need a refill on your cardiac medications before your next appointment, please call your pharmacy.   Lab work: NONE  If you have labs (blood work) drawn today and your tests are completely normal, you will receive your results only by:  [place] (if you  have MyChart) OR  A paper copy in the mail If you have any lab test that is abnormal or we need to change your treatment, we will call you to review the results.  Testing/Procedures: Your physician has requested that you have a lexiscan myoview. For further information please visit [email or web address]. Please follow instruction sheet, as given.    Follow-Up: At [facility], you and your health needs are our priority.  As part of our continuing mission to provide you with exceptional heart care, we have created designated Provider Care Teams.  These Care Teams include your primary Cardiologist (physician) and Advanced Practice Providers (APPs -  Physician Assistants and Nurse Practitioners) who all work together to provide you with the care you need, when you need it. You will need a follow up appointment in 3-4 months.  Please call our office 2 months in advance to schedule this appointment.  You may see Rozann Lesches, MD or one of the following Advanced Practice Providers on your designated Care Team:   Bernerd Pho, PA-C ([place])  Ermalinda Barrios, PA-C [facility])  Any Other Special Instructions Will Be Listed Below (If Applicable). Thank you for choosing [facility]!        Signed, Erma Heritage, PA-C  07/02/2019 8:23 PM    [facility] [address] [place], [place] [address] Phone: [phone] Fax: [phone]

## 2019-07-02 NOTE — Patient Instructions (Signed)
Medication Instructions:  Your physician recommends that you continue on your current medications as directed. Please refer to the Current Medication list given to you today.  If you need a refill on your cardiac medications before your next appointment, please call your pharmacy.   Lab work: NONE  If you have labs (blood work) drawn today and your tests are completely normal, you will receive your results only by: Marland Kitchen MyChart Message (if you have MyChart) OR . A paper copy in the mail If you have any lab test that is abnormal or we need to change your treatment, we will call you to review the results.  Testing/Procedures: Your physician has requested that you have a lexiscan myoview. For further information please visit [email or web address]. Please follow instruction sheet, as given.    Follow-Up: At [facility], you and your health needs are our priority.  As part of our continuing mission to provide you with exceptional heart care, we have created designated Provider Care Teams.  These Care Teams include your primary Cardiologist (physician) and Advanced Practice Providers (APPs -  Physician Assistants and Nurse Practitioners) who all work together to provide you with the care you need, when you need it. You will need a follow up appointment in 3-4 months.  Please call our office 2 months in advance to schedule this appointment.  You may see Rozann Lesches, MD or one of the following Advanced Practice Providers on your designated Care Team:   Bernerd Pho, PA-C [facility]) . Ermalinda Barrios, PA-C ([place])  Any Other Special Instructions Will Be Listed Below (If Applicable). Thank you for choosing [facility]!

## 2019-07-17 ENCOUNTER — Other Ambulatory Visit: Payer: Self-pay

## 2019-07-17 ENCOUNTER — Encounter (HOSPITAL_BASED_OUTPATIENT_CLINIC_OR_DEPARTMENT_OTHER)
Admission: RE | Admit: 2019-07-17 | Discharge: 2019-07-17 | Disposition: A | Discharge status: Home / Self Care | Payer: Medicare Other | Source: Ambulatory Visit | Attending: Student | Admitting: Student

## 2019-07-17 ENCOUNTER — Encounter (HOSPITAL_COMMUNITY)
Admission: RE | Admit: 2019-07-17 | Discharge: 2019-07-17 | Disposition: A | Discharge status: Home / Self Care | Payer: Medicare Other | Source: Ambulatory Visit | Attending: Student | Admitting: Student

## 2019-07-17 ENCOUNTER — Encounter (HOSPITAL_COMMUNITY): Payer: Self-pay

## 2019-07-17 DIAGNOSIS — R079 Chest pain, unspecified: Secondary | ICD-10-CM

## 2019-07-17 LAB — NM MYOCAR MULTI W/SPECT W/WALL MOTION / EF
LV dias vol: 76 mL (ref 62–150)
LV sys vol: 23 mL
Peak HR: 80 {beats}/min
RATE: 0.37
Rest HR: 57 {beats}/min
SDS: 3
SRS: 0
SSS: 3
TID: 0.99

## 2019-07-17 MED ORDER — SODIUM CHLORIDE 0.9% FLUSH
INTRAVENOUS | Status: AC
  Administered 2019-07-17: 10 mL via INTRAVENOUS
  Filled 2019-07-17: qty 10

## 2019-07-17 MED ORDER — REGADENOSON 0.4 MG/5ML IV SOLN
INTRAVENOUS | Status: AC
  Administered 2019-07-17: 0.4 mg via INTRAVENOUS
  Filled 2019-07-17: qty 5

## 2019-07-17 MED ORDER — TECHNETIUM TC 99M TETROFOSMIN IV KIT
30.0000 | PACK | Freq: Once | INTRAVENOUS | Status: AC | PRN
  Administered 2019-07-17: 31 via INTRAVENOUS

## 2019-07-17 MED ORDER — TECHNETIUM TC 99M TETROFOSMIN IV KIT
10.0000 | PACK | Freq: Once | INTRAVENOUS | Status: AC | PRN
  Administered 2019-07-17: 9.8 via INTRAVENOUS

## 2019-07-28 ENCOUNTER — Telehealth: Payer: Self-pay | Admitting: Gastroenterology

## 2019-07-28 NOTE — Telephone Encounter (Signed)
Per recent MRI 2019 result note patient is due for repeat 07/2020

## 2019-07-28 NOTE — Telephone Encounter (Signed)
RECALL FOR MRI PANCREAS

## 2019-07-29 ENCOUNTER — Telehealth: Payer: Self-pay | Admitting: *Deleted

## 2019-07-29 DIAGNOSIS — R0602 Shortness of breath: Secondary | ICD-10-CM

## 2019-07-29 NOTE — Telephone Encounter (Signed)
-----   Message from Erma Heritage, [place] sent at 07/23/2019 11:21 AM EDT ----- Regarding: Chest CT Hi Kisha,   Can you please arrange for this patient to have a NON-contrast Chest CT? Associations are dyspnea on exertion and former tobacco use. He had a [place] Myoview last week which was low-risk but his family has been in touch and he has been having worsening dyspnea on exertion.   Thanks,  [place]

## 2019-07-30 NOTE — Telephone Encounter (Signed)
CHANGED RECALL TO 2021

## 2019-08-10 DIAGNOSIS — Z23 Encounter for immunization: Secondary | ICD-10-CM | POA: Diagnosis not present

## 2019-08-14 ENCOUNTER — Other Ambulatory Visit: Payer: Self-pay

## 2019-08-14 ENCOUNTER — Ambulatory Visit (HOSPITAL_COMMUNITY)
Admission: RE | Admit: 2019-08-14 | Discharge: 2019-08-14 | Disposition: A | Discharge status: Home / Self Care | Payer: Medicare Other | Source: Ambulatory Visit | Attending: Student | Admitting: Student

## 2019-08-14 DIAGNOSIS — R0602 Shortness of breath: Secondary | ICD-10-CM | POA: Diagnosis not present

## 2019-08-25 NOTE — Telephone Encounter (Signed)
I last saw Mr. Tracy Pratt back in 2018.  He had a more recent visit with [name] PA-C for evaluation of chest discomfort and fatigue.  Myoview ultimately was found to be low risk for obstructive CAD, although we do know that he does have some coronary artery calcifications based on chest CT.  I am certainly happy to see him back and discuss symptoms if they continue.  If he would prefer to hold off and come back as needed, I would at least make sure that he keeps close follow-up with his PCP Dr. Hilma Favors.

## 2019-10-01 ENCOUNTER — Ambulatory Visit: Payer: Medicare Other | Admitting: Cardiology

## 2019-11-10 DIAGNOSIS — Z23 Encounter for immunization: Secondary | ICD-10-CM | POA: Diagnosis not present

## 2019-11-19 ENCOUNTER — Ambulatory Visit: Payer: Medicare Other

## 2019-12-07 DIAGNOSIS — Z23 Encounter for immunization: Secondary | ICD-10-CM | POA: Diagnosis not present

## 2020-01-06 DIAGNOSIS — H18519 Endothelial corneal dystrophy, unspecified eye: Secondary | ICD-10-CM | POA: Diagnosis not present

## 2020-02-11 ENCOUNTER — Other Ambulatory Visit: Payer: Self-pay

## 2020-02-11 ENCOUNTER — Encounter: Payer: Self-pay | Admitting: Orthopaedic Surgery

## 2020-02-11 ENCOUNTER — Ambulatory Visit (INDEPENDENT_AMBULATORY_CARE_PROVIDER_SITE_OTHER): Payer: Medicare Other | Admitting: Orthopaedic Surgery

## 2020-02-11 VITALS — BP 147/82 | HR 74 | Ht 70.0 in | Wt 205.0 lb

## 2020-02-11 DIAGNOSIS — G609 Hereditary and idiopathic neuropathy, unspecified: Secondary | ICD-10-CM

## 2020-02-11 DIAGNOSIS — M79642 Pain in left hand: Secondary | ICD-10-CM

## 2020-02-11 NOTE — Progress Notes (Signed)
Patient [id number] Tracy Pratt, male DOB:05-22-1931, 86 y.o. [id number]  Chief Complaint  Patient presents with  . Hand Problem    knots on both hands   . Foot Pain    left / at night     HPI  Tracy Pratt is a 86 y.o. male who has two problems.  1.  He has pain in the left hand, palm side between little and ring fingers.  He has noticed prominence of the fifth metacarpal on the palm.  He has "knots" or "bands" developing that hurt.  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  He has done this valuable service for years.  His hands are bothering him now.  He has gotten away from most lifting and is more of a Librarian, academic.  He does "get into it" at times.  2.  He has pain of the left lateral foot and top of the left foot at night when he goes to bed.  He is still away and he notices he has much less feeling in the lateral toes and sometimes has a marked burning pain.  There is no set pattern to the timing. He may have a busy day at the foot pantry and have no pain or he might.  He has no discoloration of the foot, no redness, no swelling, no trauma.  It does not bother him during the day at all.     Body mass index is 29.41 kg/m.  ROS  Review of Systems  Constitutional: Positive for activity change.       Patient does not have Diabetes Mellitus. Patient has hypertension. Patient does not have COPD or shortness of breath. Patient does not have BMI > 35. Patient does not have current smoking history.  HENT: Negative for congestion.   Eyes:       Post cataract surgery, doing well   Respiratory: Negative for cough and shortness of breath.   Cardiovascular: Negative for chest pain and leg swelling.       Malignant hyperthermia with anesthesia  Endocrine: Negative for cold intolerance.  Genitourinary:       Post prostate cancer with treatment, doing well.  Musculoskeletal: Positive for arthralgias (prior of knees and shoulders, resolved for now, recurrent),  back pain and neck pain.  Allergic/Immunologic: Negative for environmental allergies.  All other systems reviewed and are negative.   All other systems reviewed and are negative.  The following is a summary of the past history medically, past history surgically, known current medicines, social history and family history.  This information is gathered electronically by the computer from prior information and documentation.  I review this each visit and have found including this information at this point in the chart is beneficial and informative.    Past Medical History:  Diagnosis Date  . Adrenal adenoma    Left  . Arthritis   . BPH (benign prostatic hyperplasia)   . Cataract 2007, 2009   Bilateral  . Colon polyps   . Essential hypertension   . Helicobacter pylori gastritis 09/09/2018  . Malignant hyperthermia   . Prostate cancer ([place]) 08/08/2007   Seed Implant and XRT  . Spondylolisthesis    L5-S1    Past Surgical History:  Procedure Laterality Date  . APPENDECTOMY  1940  . BACK SURGERY  1979  . BIOPSY  08/25/2018   Procedure: BIOPSY;  Surgeon: Gatha Mayer, MD;  Location: [facility];  Service: Endoscopy;;  .  CATARACT EXTRACTION  2007  . CORNEAL TRANSPLANT  2007  . ESOPHAGOGASTRODUODENOSCOPY (EGD) WITH PROPOFOL N/A 08/25/2018   Procedure: ESOPHAGOGASTRODUODENOSCOPY (EGD) WITH PROPOFOL;  Surgeon: Gatha Mayer, MD;  Location: [facility];  Service: Endoscopy;  Laterality: N/A;  . KNEE SURGERY  1996   left knee arthoscopic  . MALONEY DILATION  08/25/2018   Procedure: Venia Minks DILATION;  Surgeon: Gatha Mayer, MD;  Location: [facility];  Service: Endoscopy;;  . ROTATOR CUFF REPAIR  2015  . Torn meniscus left knee  1996  . TRANSURETHRAL RESECTION OF PROSTATE  1997    Family History  Problem Relation Age of Onset  . Prostate cancer Brother   . Lung disease Brother        lobectomy    Social History Social History   Tobacco Use  . Smoking status:  Former Smoker    Packs/day: 1.00    Years: 15.00    Pack years: 15.00    Types: Cigarettes  . Smokeless tobacco: Never Used  . Tobacco comment: quit 28 years ago  Substance Use Topics  . Alcohol use: No  . Drug use: No    Allergies  Allergen Reactions  . Diclofenac Sodium Anaphylaxis  . Nabumetone Anaphylaxis  . Nsaids Anaphylaxis  . Tolmetin Anaphylaxis  . Bee Venom Swelling  . Desflurane Other (See Comments)    Malignant hyperthermia  . Enflurane Other (See Comments)    Malignant hyperthermia  . Halothane Other (See Comments)    Malignant hyperthermia  . Isoflurane Other (See Comments)    Malignant hyperthermia  . Sevoflurane Other (See Comments)    Malignant hyperthermia  . Succinylcholine Other (See Comments)    Malignant hyperthermia    Current Outpatient Medications  Medication Sig Dispense Refill  . furosemide (LASIX) 40 MG tablet Take 40 mg by mouth daily as needed for fluid.     Marland Kitchen HYDROcodone-acetaminophen (NORCO) 7.5-325 MG tablet TAKE ONE TABLET BY MOUTH EVERY 6 HOURS AS NEEDED FOR MODERATE PAIN. 60 tablet 0  . lisinopril-hydrochlorothiazide (PRINZIDE,ZESTORETIC) 10-12.5 MG tablet Take 1 tablet by mouth daily.    Marland Kitchen loteprednol (LOTEMAX) 0.5 % ophthalmic suspension Place 1 drop into both eyes at bedtime.      No current facility-administered medications for this visit.     Physical Exam  Blood pressure (!) 147/82, pulse 74, height 5\' 10"  (1.778 m), weight 205 lb (93 kg).  Constitutional: overall normal hygiene, normal nutrition, well developed, normal grooming, normal body habitus. Assistive device:none  Musculoskeletal: gait and station Limp none, muscle tone and strength are normal, no tremors or atrophy is present.  .  Neurological: coordination overall normal.  Deep tendon reflex/nerve stretch intact.  Sensation normal.  Cranial nerves II-XII intact.   Skin:   Normal overall no scars, lesions, ulcers or rashes. No psoriasis.  Psychiatric: Alert  and oriented x 3.  Recent memory intact, remote memory unclear.  Normal mood and affect. Well groomed.  Good eye contact.  Cardiovascular: overall no swelling, no varicosities, no edema bilaterally, normal temperatures of the legs and arms, no clubbing, cyanosis and good capillary refill.  Lymphatic: palpation is normal.  His left nondominant hand has some early bands often seen with early Dupuytren Contracture.  It is not tender today. ROM is good.  NV intact.  I will have him see a hand surgeon for this.  There is an injectable product that he might benefit from.  The right hand does not have a band.  The left foot has  good pulses, but he has decreased sensation lateral foot.  Color is normal. ROM is full.  He has no swelling, no limp.  All other systems reviewed and are negative   The patient has been educated about the nature of the problem(s) and counseled on treatment options.  The patient appeared to understand what I have discussed and is in agreement with it.  Encounter Diagnoses  Name Primary?  . Pain in left hand Yes  . Peripheral neuropathy, idiopathic     PLAN Call if any problems.  Precautions discussed.  Continue current medications.   Return to clinic to see hand surgeon for Duputryen   It should be noted patient has history of familial hyperthermia.   To talk to his family doctor about the peripheral neuropathy.  I do not order primarily Neurontin or Lyrica that is often used to treat.  I will let family doctor decide on this and whether a neurologist needs to be consulted.  I will see as needed.  Electronically Signed Sanjuana Kava, MD 4/15/20213:15 PM

## 2020-02-17 DIAGNOSIS — L570 Actinic keratosis: Secondary | ICD-10-CM | POA: Diagnosis not present

## 2020-02-17 DIAGNOSIS — X32XXXD Exposure to sunlight, subsequent encounter: Secondary | ICD-10-CM | POA: Diagnosis not present

## 2020-02-18 ENCOUNTER — Telehealth: Payer: Self-pay | Admitting: Orthopaedic Surgery

## 2020-02-18 MED ORDER — HYDROCODONE-ACETAMINOPHEN 7.5-325 MG PO TABS: ORAL_TABLET | ORAL | 0 refills | Status: DC

## 2020-02-18 NOTE — Telephone Encounter (Signed)
Pt's daughter Tracy Pratt called and stated Mr. Sharp did not receive any pain medication on his last visit.  She said he was under the impression that something was going to be called in for him.  Would you send in pain medication to [place]?  Thanks

## 2020-02-19 DIAGNOSIS — N390 Urinary tract infection, site not specified: Secondary | ICD-10-CM | POA: Diagnosis not present

## 2020-02-19 DIAGNOSIS — N451 Epididymitis: Secondary | ICD-10-CM | POA: Diagnosis not present

## 2020-02-19 DIAGNOSIS — Z8546 Personal history of malignant neoplasm of prostate: Secondary | ICD-10-CM | POA: Diagnosis not present

## 2020-02-29 DIAGNOSIS — M79641 Pain in right hand: Secondary | ICD-10-CM | POA: Insufficient documentation

## 2020-02-29 DIAGNOSIS — M720 Palmar fascial fibromatosis [Dupuytren]: Secondary | ICD-10-CM | POA: Diagnosis not present

## 2020-02-29 DIAGNOSIS — M79642 Pain in left hand: Secondary | ICD-10-CM | POA: Diagnosis not present

## 2020-03-03 DIAGNOSIS — N453 Epididymo-orchitis: Secondary | ICD-10-CM | POA: Diagnosis not present

## 2020-03-03 DIAGNOSIS — Z8546 Personal history of malignant neoplasm of prostate: Secondary | ICD-10-CM | POA: Diagnosis not present

## 2020-03-24 ENCOUNTER — Ambulatory Visit (INDEPENDENT_AMBULATORY_CARE_PROVIDER_SITE_OTHER): Payer: Medicare Other | Admitting: Gastroenterology

## 2020-03-24 ENCOUNTER — Telehealth: Payer: Self-pay

## 2020-03-24 ENCOUNTER — Encounter: Payer: Self-pay | Admitting: Gastroenterology

## 2020-03-24 ENCOUNTER — Other Ambulatory Visit: Payer: Self-pay

## 2020-03-24 VITALS — BP 150/74 | HR 61 | Temp 97.3°F | Ht 69.0 in | Wt 204.6 lb

## 2020-03-24 DIAGNOSIS — K869 Disease of pancreas, unspecified: Secondary | ICD-10-CM

## 2020-03-24 DIAGNOSIS — B9681 Helicobacter pylori [H. pylori] as the cause of diseases classified elsewhere: Secondary | ICD-10-CM | POA: Diagnosis not present

## 2020-03-24 DIAGNOSIS — K2970 Gastritis, unspecified, without bleeding: Secondary | ICD-10-CM

## 2020-03-24 NOTE — Progress Notes (Signed)
Referring Provider: [name], MD Primary Care Physician:  [name], MD  Primary GI: Dr. Oneida Alar   Chief Complaint  Patient presents with  . Follow-up    pancreatic cyst    HPI:   Tracy Pratt is a 86 y.o. male presenting today for routine follow-up. He is the father of [name], PA-C. He has previously been followed by Dr. Oneida Alar.  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 Needs documented eradication. Colonoscopy in 2012 with hyperplastic polyp. With history of malignant hyperthermia, he is not a candidate for procedures at [facility].     Oct 2019: MRabdomen multiple dilated side ducts in the pancreas with small fusiform region of mild dorsal pancreatic duct dilatation in pancreatic tail, possible residua from prior pancreatitis, unable to exclude IPMN   Right hand side of rectum has some swelling. Has to wipe more than usual to clean himself. History of hemorrhoids last year. Nuisance. Not getting bigger or smaller. No pain. Feels like a lump. No rectal bleeding. No itching or burning. No constipation. No diarrhea. No straining. Prolonged toilet time. States this is when he can "Get a break". Very active with the food pantry, 6/7 days a week.   Eats when he gets a chance. Says he is told he isn't eating enough by his daughters. Snacks all the time. Notes some vague early satiety. No abdominal pain. No nausea or vomiting. No dysphagia. Gets busy and doesn't think about eating. After a long day will feel more tired. No chest pain or shortness of breath.   Sept 2020 was 215, today 204. In 2018 high 220s. States from [place] till past year or so was around 216. Healthy Choice dinner at night for past year. He wonders if this contributed to his weight loss.   Past Medical History:  Diagnosis Date  . Adrenal adenoma    Left  . Arthritis   . BPH (benign prostatic hyperplasia)   . Cataract 2007,  2009   Bilateral  . Colon polyps   . Essential hypertension   . Helicobacter pylori gastritis 09/09/2018  . Malignant hyperthermia   . Prostate cancer ([place]) 08/08/2007   Seed Implant and XRT  . Spondylolisthesis    L5-S1    Past Surgical History:  Procedure Laterality Date  . APPENDECTOMY  1940  . BACK SURGERY  1979  . BIOPSY  08/25/2018   Procedure: BIOPSY;  Surgeon: Gatha Mayer, MD;  [facility];  Service: Endoscopy;;  . CATARACT EXTRACTION  2007  . COLONOSCOPY  2012   [facility]: single diminutive hyperplastic polyp in rectum  . CORNEAL TRANSPLANT  2007  . ESOPHAGOGASTRODUODENOSCOPY (EGD) WITH PROPOFOL N/A 08/25/2018   Empiric dilation of esophagus, erythematous mucosa in antrum s/p biopsy, normal duodenum. Clotest positive.  Marland Kitchen KNEE SURGERY  1996   left knee arthoscopic  . MALONEY DILATION  08/25/2018   Procedure: Venia Minks DILATION;  Surgeon: Gatha Mayer, MD;  [facility];  Service: Endoscopy;;  . ROTATOR CUFF REPAIR  2015  . Torn meniscus left knee  1996  . TRANSURETHRAL RESECTION OF PROSTATE  1997    Current Outpatient Medications  Medication Sig Dispense Refill  . furosemide (LASIX) 40 MG tablet Take 40 mg by mouth daily as needed for fluid.     Marland Kitchen HYDROcodone-acetaminophen (NORCO) 7.5-325 MG tablet TAKE ONE TABLET BY MOUTH EVERY 6 HOURS AS NEEDED FOR MODERATE  PAIN. 60 tablet 0  . lisinopril-hydrochlorothiazide (PRINZIDE,ZESTORETIC) 10-12.5 MG tablet Take 1 tablet by mouth daily.    Marland Kitchen loteprednol (LOTEMAX) 0.5 % ophthalmic suspension Place 1 drop into both eyes at bedtime.      No current facility-administered medications for this visit.    Allergies as of 03/24/2020 - Review Complete 03/24/2020  Allergen Reaction Noted  . Diclofenac sodium Anaphylaxis   . Nabumetone Anaphylaxis   . Nsaids Anaphylaxis 10/29/2011  . Tolmetin Anaphylaxis 10/29/2011  . Bee venom Swelling 06/13/2017  . Desflurane Other (See Comments) 10/29/2011  .  Enflurane Other (See Comments) 10/29/2011  . Halothane Other (See Comments) 10/29/2011  . Isoflurane Other (See Comments) 10/29/2011  . Sevoflurane Other (See Comments) 10/29/2011  . Succinylcholine Other (See Comments) 10/29/2011    Family History  Problem Relation Age of Onset  . Prostate cancer Brother   . Lung disease Brother        lobectomy    Social History   Socioeconomic History  . Marital status: Widowed    Spouse name: Not on file  . Number of children: 2  . Years of education: Not on file  . Highest education level: Not on file  Occupational History  . Occupation: Furniture conservator/restorer  Tobacco Use  . Smoking status: Former Smoker    Packs/day: 1.00    Years: 15.00    Pack years: 15.00    Types: Cigarettes  . Smokeless tobacco: Never Used  . Tobacco comment: quit 28 years ago  Substance and Sexual Activity  . Alcohol use: No  . Drug use: No  . Sexual activity: Not on file  Other Topics Concern  . Not on file  Social History Narrative   Widowed   Daughter [name] PA-C [facility]   Retired Geologist, engineering   2 daughters   4 caffeine/day   Prior smoker, no EtOH/drugs   Social Determinants of Radio broadcast assistant Strain:   . Difficulty of Paying Living Expenses:   Food Insecurity:   . Worried About Charity fundraiser in the Last Year:   . Arboriculturist in the Last Year:   Transportation Needs:   . Film/video editor (Medical):   Marland Kitchen Lack of Transportation (Non-Medical):   Physical Activity:   . Days of Exercise per Week:   . Minutes of Exercise per Session:   Stress:   . Feeling of Stress :   Social Connections:   . Frequency of Communication with Friends and Family:   . Frequency of Social Gatherings with Friends and Family:   . Attends Religious Services:   . Active Member of Clubs or Organizations:   . Attends Archivist Meetings:   Marland Kitchen Marital Status:     Review of Systems: Gen: see HPI CV: Denies chest pain,  palpitations, syncope, peripheral edema, and claudication. Resp: Denies dyspnea at rest, cough, wheezing, coughing up blood, and pleurisy. GI: see HPI Derm: Denies rash, itching, dry skin Psych: Denies depression, anxiety, memory loss, confusion. No homicidal or suicidal ideation.  Heme: Denies bruising, bleeding, and enlarged lymph nodes.  Physical Exam: BP (!) 150/74   Pulse 61   Temp (!) 97.3 F (36.3 C) (Oral)   Ht 5\' 9"  (1.753 m)   Wt 204 lb 9.6 oz (92.8 kg)   BMI 30.21 kg/m  General:   Alert and oriented. No distress noted. Pleasant and cooperative.  Head:  Normocephalic and atraumatic. Eyes:  Conjuctiva clear without scleral icterus.  Mouth:  Mask in place Cardiac: S1 S2 present without murmurs Lungs: clear bilaterally Abdomen:  +BS, soft, non-tender and non-distended. No rebound or guarding. No HSM. possible rectus diastasis vs ventral hernia Rectal: external hemorrhoid tags X 2 noted, the large about quarter-sized. No thrombosed hemorrhoids. DRE without mass.  Msk:  Symmetrical without gross deformities. Normal posture. Extremities:  Without edema. Neurologic:  Alert and  oriented x4 Psych:  Alert and cooperative. Normal mood and affect.  ASSESSMENT: Natavius Fatima is a delightful 86 y.o. male presenting today with history of H.pylori gastritis, MR abdomen noted pancreatic lesion in past and due for surveillance, residual skin tags from prior hemorrhoids, recently with weight loss from 2015 in Sept 2020 to 204 today.  H.pylori gastritis: currently asymptomatic. Urea breath test for documenting eradication to be done tomorrow. He has not been on a PPI recently. Last antibiotics 3 weeks ago.   Pancreatic lesion: due for surveillance. MRI to be arranged.   Residual skin tags from hemorrhoids: rectal exam unrevealing for prolapsing internal hemorrhoids, thrombosed hemorrhoids, and he has notable residual skin tags that are bothersome only when wiping. Will benefit from  adding fiber to improve bowel function and production, use hemorrhoid wipes if needed for wiping. Removal could be considered, but with his history of malignant hyperthermia, age, and the fact these are not painful or troublesome (only a nuisance), would favor supportive measures. I discussed this as well with his daughter.   Weight loss: multifactorial in setting of diet changes, decreased oral intake, possible age-related factors. No concerning lower GI signs/symptoms, with last colonoscopy in 2012 with hyperplastic polyp. EGD on file from several years ago with H.pylori gastritis, which we need to ensure has been eradicated. Encouragingly, MRI is upcoming to evaluate pancreatic lesion. Further recommendations after MRI.    PLAN:   MRI abdomen for pancreatic lesion  Urea breath test  Conservative supportive for residual skin tags  Benefiber 2 teaspoons daily, titrate up as tolerated  Further recommendations after MRI  Annitta Needs, PhD, ANP-BC [facility]

## 2020-03-24 NOTE — Patient Instructions (Signed)
Please complete the breath test when you are able (Tomorrow would be fine). Make sure not to eat or drink anything prior to the test. Do not take Rolaids, Tums, etc before the test.  We are arranging an MRI of your pancreas when it works well with your schedule!  I recommend hemorrhoid wipes or similar to help with wiping after a bowel movement. You may want to try adding Benefiber 2 teaspoons to your coffee or beverage each morning to help with productive bowel movements.   Further recommendations to follow!  It was a pleasure to see you today. I want to create trusting relationships with patients to provide genuine, compassionate, and quality care. I value your feedback. If you receive a survey regarding your visit,  I greatly appreciate you taking time to fill this out.   Annitta Needs, PhD, ANP-BC [facility]

## 2020-03-24 NOTE — Telephone Encounter (Signed)
MRI abd w/wo contrast scheduled for 04/19/20 at 10:00am, arrive at 9:15am for stat lab. NPO 4 hours prior to test.  Called and informed pt of MRI appt. Letter mailed.

## 2020-03-25 ENCOUNTER — Encounter: Payer: Self-pay | Admitting: Gastroenterology

## 2020-03-25 DIAGNOSIS — K2970 Gastritis, unspecified, without bleeding: Secondary | ICD-10-CM | POA: Diagnosis not present

## 2020-03-25 DIAGNOSIS — B9681 Helicobacter pylori [H. pylori] as the cause of diseases classified elsewhere: Secondary | ICD-10-CM | POA: Diagnosis not present

## 2020-03-29 LAB — H. PYLORI BREATH TEST: H. pylori Breath Test: DETECTED — AB

## 2020-04-19 ENCOUNTER — Other Ambulatory Visit: Payer: Self-pay | Admitting: Gastroenterology

## 2020-04-19 ENCOUNTER — Other Ambulatory Visit: Payer: Self-pay

## 2020-04-19 ENCOUNTER — Ambulatory Visit (HOSPITAL_COMMUNITY)
Admission: RE | Admit: 2020-04-19 | Discharge: 2020-04-19 | Disposition: A | Discharge status: Home / Self Care | Payer: Medicare Other | Source: Ambulatory Visit | Attending: Gastroenterology | Admitting: Gastroenterology

## 2020-04-19 DIAGNOSIS — K869 Disease of pancreas, unspecified: Secondary | ICD-10-CM | POA: Diagnosis present

## 2020-04-19 MED ORDER — GADOBUTROL 1 MMOL/ML IV SOLN
10.0000 mL | Freq: Once | INTRAVENOUS | Status: AC | PRN
  Administered 2020-04-19: 10 mL via INTRAVENOUS

## 2020-04-25 ENCOUNTER — Telehealth: Payer: Self-pay | Admitting: Orthopaedic Surgery

## 2020-04-25 MED ORDER — METHOCARBAMOL 500 MG PO TABS: 500.0000 mg | ORAL_TABLET | Freq: Three times a day (TID) | ORAL | 1 refills | Status: DC

## 2020-04-25 NOTE — Telephone Encounter (Signed)
Patient requests refill on Methocarbomol 500 mgs.  Qty  60  Sig: TAKE (1) TABLET BY MOUTH (3) TIMES DAILY.  Patient states he uses [facility]

## 2020-05-12 DIAGNOSIS — C61 Malignant neoplasm of prostate: Secondary | ICD-10-CM | POA: Diagnosis not present

## 2020-05-12 DIAGNOSIS — Z8546 Personal history of malignant neoplasm of prostate: Secondary | ICD-10-CM | POA: Diagnosis not present

## 2020-06-06 ENCOUNTER — Other Ambulatory Visit: Payer: Self-pay | Admitting: Gastroenterology

## 2020-06-06 MED ORDER — PYLERA 140-125-125 MG PO CAPS: 3.0000 | ORAL_CAPSULE | Freq: Three times a day (TID) | ORAL | 0 refills | Status: DC

## 2020-06-06 NOTE — Progress Notes (Signed)
Pylera for 10 days sent to pharmacy.   May need prior authorization or samples, depending on how insurance covers. Previously taking amoxicillin preparations twice.

## 2020-06-29 ENCOUNTER — Telehealth: Payer: Self-pay | Admitting: Internal Medicine

## 2020-06-29 NOTE — Telephone Encounter (Signed)
RECALL FOR MRI 

## 2020-06-29 NOTE — Telephone Encounter (Signed)
Per MRI 04/17/20 needs repeat in 1-2 years. Roseanne Kaufman NP advised to [place] 1 year.

## 2020-06-29 NOTE — Telephone Encounter (Signed)
Routing to [place] to update NIC.

## 2020-06-29 NOTE — Telephone Encounter (Signed)
MRI not due till June 2022.

## 2020-06-30 NOTE — Telephone Encounter (Signed)
Recall updated

## 2020-07-25 DIAGNOSIS — Z1389 Encounter for screening for other disorder: Secondary | ICD-10-CM | POA: Diagnosis not present

## 2020-07-25 DIAGNOSIS — R7309 Other abnormal glucose: Secondary | ICD-10-CM | POA: Diagnosis not present

## 2020-07-25 DIAGNOSIS — Z23 Encounter for immunization: Secondary | ICD-10-CM | POA: Diagnosis not present

## 2020-07-25 DIAGNOSIS — I10 Essential (primary) hypertension: Secondary | ICD-10-CM | POA: Diagnosis not present

## 2020-07-25 DIAGNOSIS — Z0000 Encounter for general adult medical examination without abnormal findings: Secondary | ICD-10-CM | POA: Diagnosis not present

## 2020-07-25 DIAGNOSIS — R946 Abnormal results of thyroid function studies: Secondary | ICD-10-CM | POA: Diagnosis not present

## 2020-07-25 DIAGNOSIS — Z6830 Body mass index (BMI) 30.0-30.9, adult: Secondary | ICD-10-CM | POA: Diagnosis not present

## 2020-08-09 DIAGNOSIS — H1812 Bullous keratopathy, left eye: Secondary | ICD-10-CM | POA: Diagnosis not present

## 2020-08-19 DIAGNOSIS — H18513 Endothelial corneal dystrophy, bilateral: Secondary | ICD-10-CM | POA: Diagnosis not present

## 2020-08-24 ENCOUNTER — Telehealth: Payer: Self-pay

## 2020-08-24 NOTE — Telephone Encounter (Signed)
Received letter from [facility]. Pt wasn't approved for Pylera. Letter states that after careful review, pt's income is over the program eligibility.

## 2020-09-02 DIAGNOSIS — H18513 Endothelial corneal dystrophy, bilateral: Secondary | ICD-10-CM | POA: Diagnosis not present

## 2020-09-02 DIAGNOSIS — T868419 Corneal transplant failure, unspecified eye: Secondary | ICD-10-CM | POA: Diagnosis not present

## 2020-09-02 DIAGNOSIS — H02889 Meibomian gland dysfunction of unspecified eye, unspecified eyelid: Secondary | ICD-10-CM | POA: Diagnosis not present

## 2020-09-02 DIAGNOSIS — Z947 Corneal transplant status: Secondary | ICD-10-CM | POA: Diagnosis not present

## 2020-09-20 MED ORDER — PYLERA 140-125-125 MG PO CAPS: 3.0000 | ORAL_CAPSULE | Freq: Three times a day (TID) | ORAL | 0 refills | Status: DC

## 2020-09-20 MED ORDER — PANTOPRAZOLE SODIUM 40 MG PO TBEC: 40.0000 mg | DELAYED_RELEASE_TABLET | Freq: Two times a day (BID) | ORAL | 0 refills | Status: DC

## 2020-09-20 NOTE — Addendum Note (Signed)
Addended by: Annitta Needs on: 09/20/2020 03:44 PM   Modules accepted: Orders

## 2020-09-20 NOTE — Telephone Encounter (Signed)
Pylera sent to pharmacy for 14 days. Protonix sent along as well for BID while on Pylera. Daughter aware.

## 2020-09-21 ENCOUNTER — Telehealth: Payer: Self-pay

## 2020-09-21 NOTE — Telephone Encounter (Signed)
Prior Authorization for this pt for Rx [id number] Pylera 140-125-DS mg cap needed more codes. Put in codes waiting to see if it will be authorized. Sent to [place] who was already working on this. (This was in Cover my meds box)

## 2020-09-21 NOTE — Telephone Encounter (Signed)
PA was submitted through [email or web address] for Pylera #120 pills for a 10 days supply. Waiting on an approval or denial.

## 2020-09-27 NOTE — Telephone Encounter (Signed)
Received an approval letter for Pylera Capsule 140/125/125 mg capsule, #120 capsules for 10 days. Approval letter was scanned. Called [place] and they aware of the [place] for Pylera and pt has already picked his medication up.

## 2020-11-03 DIAGNOSIS — L570 Actinic keratosis: Secondary | ICD-10-CM | POA: Diagnosis not present

## 2020-11-03 DIAGNOSIS — X32XXXD Exposure to sunlight, subsequent encounter: Secondary | ICD-10-CM | POA: Diagnosis not present

## 2020-11-17 DIAGNOSIS — H18512 Endothelial corneal dystrophy, left eye: Secondary | ICD-10-CM | POA: Diagnosis not present

## 2020-11-17 DIAGNOSIS — H18519 Endothelial corneal dystrophy, unspecified eye: Secondary | ICD-10-CM | POA: Diagnosis not present

## 2020-11-17 DIAGNOSIS — Z87891 Personal history of nicotine dependence: Secondary | ICD-10-CM | POA: Diagnosis not present

## 2020-11-17 DIAGNOSIS — Z886 Allergy status to analgesic agent status: Secondary | ICD-10-CM | POA: Diagnosis not present

## 2020-11-17 DIAGNOSIS — Z6830 Body mass index (BMI) 30.0-30.9, adult: Secondary | ICD-10-CM | POA: Diagnosis not present

## 2020-11-17 DIAGNOSIS — I10 Essential (primary) hypertension: Secondary | ICD-10-CM | POA: Diagnosis not present

## 2020-11-17 DIAGNOSIS — K219 Gastro-esophageal reflux disease without esophagitis: Secondary | ICD-10-CM | POA: Diagnosis not present

## 2020-11-17 DIAGNOSIS — Z79899 Other long term (current) drug therapy: Secondary | ICD-10-CM | POA: Diagnosis not present

## 2020-11-17 DIAGNOSIS — T868412 Corneal transplant failure, left eye: Secondary | ICD-10-CM | POA: Diagnosis not present

## 2020-11-17 DIAGNOSIS — E669 Obesity, unspecified: Secondary | ICD-10-CM | POA: Diagnosis not present

## 2020-11-17 DIAGNOSIS — Z947 Corneal transplant status: Secondary | ICD-10-CM | POA: Diagnosis not present

## 2020-11-17 DIAGNOSIS — Z888 Allergy status to other drugs, medicaments and biological substances status: Secondary | ICD-10-CM | POA: Diagnosis not present

## 2020-11-29 DIAGNOSIS — Z9889 Other specified postprocedural states: Secondary | ICD-10-CM | POA: Diagnosis not present

## 2020-12-16 DIAGNOSIS — B0233 Zoster keratitis: Secondary | ICD-10-CM | POA: Diagnosis not present

## 2020-12-23 DIAGNOSIS — B0233 Zoster keratitis: Secondary | ICD-10-CM | POA: Diagnosis not present

## 2021-01-18 ENCOUNTER — Other Ambulatory Visit (HOSPITAL_COMMUNITY): Payer: Self-pay | Admitting: Physician Assistant

## 2021-01-18 DIAGNOSIS — R0789 Other chest pain: Secondary | ICD-10-CM | POA: Diagnosis not present

## 2021-01-18 DIAGNOSIS — Z6831 Body mass index (BMI) 31.0-31.9, adult: Secondary | ICD-10-CM | POA: Diagnosis not present

## 2021-01-18 DIAGNOSIS — Z1331 Encounter for screening for depression: Secondary | ICD-10-CM | POA: Diagnosis not present

## 2021-01-18 DIAGNOSIS — E6609 Other obesity due to excess calories: Secondary | ICD-10-CM | POA: Diagnosis not present

## 2021-01-19 ENCOUNTER — Ambulatory Visit (INDEPENDENT_AMBULATORY_CARE_PROVIDER_SITE_OTHER): Payer: Medicare Other | Admitting: Orthopaedic Surgery

## 2021-01-19 ENCOUNTER — Encounter: Payer: Self-pay | Admitting: Orthopaedic Surgery

## 2021-01-19 ENCOUNTER — Ambulatory Visit (HOSPITAL_COMMUNITY)
Admission: RE | Admit: 2021-01-19 | Discharge: 2021-01-19 | Disposition: A | Discharge status: Home / Self Care | Payer: Medicare Other | Source: Ambulatory Visit | Attending: Physician Assistant | Admitting: Physician Assistant

## 2021-01-19 ENCOUNTER — Other Ambulatory Visit: Payer: Self-pay

## 2021-01-19 VITALS — BP 141/70 | HR 67 | Ht 69.0 in | Wt 214.0 lb

## 2021-01-19 DIAGNOSIS — R079 Chest pain, unspecified: Secondary | ICD-10-CM | POA: Diagnosis not present

## 2021-01-19 DIAGNOSIS — M79642 Pain in left hand: Secondary | ICD-10-CM | POA: Diagnosis not present

## 2021-01-19 DIAGNOSIS — R0789 Other chest pain: Secondary | ICD-10-CM | POA: Insufficient documentation

## 2021-01-19 MED ORDER — HYDROCODONE-ACETAMINOPHEN 7.5-325 MG PO TABS: ORAL_TABLET | ORAL | 0 refills | Status: DC

## 2021-01-19 NOTE — Progress Notes (Signed)
Patient [id number] Tracy Pratt, male DOB:12-29-1930, 87 y.o. KCL:[id number]  Chief Complaint  Patient presents with  . Hand Pain    Left hand patient reports doing some better,     HPI  Tracy Pratt is a 87 y.o. male who has left hand pain.  He saw the hand surgeon who said he should not have any surgery.  He has no new trauma.  He has tenderness of the hand.  He has thin skin and often hits his skin and the dorsum of the hand bleeds.  He needs a refill of his pain medicine.  It has lasted since last April.   Body mass index is 31.6 kg/m.  ROS  Review of Systems  Constitutional: Positive for activity change.       Patient does not have Diabetes Mellitus. Patient has hypertension. Patient does not have COPD or shortness of breath. Patient does not have BMI > 35. Patient does not have current smoking history.  HENT: Negative for congestion.   Eyes:       Post cataract surgery, doing well   Respiratory: Negative for cough and shortness of breath.   Cardiovascular: Negative for chest pain and leg swelling.       Malignant hyperthermia with anesthesia  Endocrine: Negative for cold intolerance.  Genitourinary:       Post prostate cancer with treatment, doing well.  Musculoskeletal: Positive for arthralgias (prior of knees and shoulders, resolved for now, recurrent), back pain and neck pain.  Allergic/Immunologic: Negative for environmental allergies.  All other systems reviewed and are negative.   All other systems reviewed and are negative.  The following is a summary of the past history medically, past history surgically, known current medicines, social history and family history.  This information is gathered electronically by the computer from prior information and documentation.  I review this each visit and have found including this information at this point in the chart is beneficial and informative.    Past Medical History:  Diagnosis Date  . Adrenal adenoma    Left   . Arthritis   . BPH (benign prostatic hyperplasia)   . Cataract 2007, 2009   Bilateral  . Colon polyps   . Essential hypertension   . Helicobacter pylori gastritis 09/09/2018  . Malignant hyperthermia   . Prostate cancer ([place]) 08/08/2007   Seed Implant and XRT  . Spondylolisthesis    L5-S1    Past Surgical History:  Procedure Laterality Date  . APPENDECTOMY  1940  . BACK SURGERY  1979  . BIOPSY  08/25/2018   Procedure: BIOPSY;  Surgeon: Gatha Mayer, MD;  Location: [facility];  Service: Endoscopy;;  . CATARACT EXTRACTION  2007  . COLONOSCOPY  2012   [facility]: single diminutive hyperplastic polyp in rectum  . CORNEAL TRANSPLANT  2007  . ESOPHAGOGASTRODUODENOSCOPY (EGD) WITH PROPOFOL N/A 08/25/2018   Empiric dilation of esophagus, erythematous mucosa in antrum s/p biopsy, normal duodenum. Clotest positive.  Marland Kitchen KNEE SURGERY  1996   left knee arthoscopic  . MALONEY DILATION  08/25/2018   Procedure: Venia Minks DILATION;  Surgeon: Gatha Mayer, MD;  Location: [facility];  Service: Endoscopy;;  . ROTATOR CUFF REPAIR  2015  . Torn meniscus left knee  1996  . TRANSURETHRAL RESECTION OF PROSTATE  1997    Family History  Problem Relation Age of Onset  . Prostate cancer Brother   . Lung disease Brother        lobectomy  Social History Social History   Tobacco Use  . Smoking status: Former Smoker    Packs/day: 1.00    Years: 15.00    Pack years: 15.00    Types: Cigarettes  . Smokeless tobacco: Never Used  . Tobacco comment: quit 28 years ago  Vaping Use  . Vaping Use: Never used  Substance Use Topics  . Alcohol use: No  . Drug use: No    Allergies  Allergen Reactions  . Diclofenac Sodium Anaphylaxis  . Nabumetone Anaphylaxis  . Nsaids Anaphylaxis  . Tolmetin Anaphylaxis  . Bee Venom Swelling  . Desflurane Other (See Comments)    Malignant hyperthermia  . Enflurane Other (See Comments)    Malignant hyperthermia  . Halothane Other (See Comments)     Malignant hyperthermia  . Isoflurane Other (See Comments)    Malignant hyperthermia  . Sevoflurane Other (See Comments)    Malignant hyperthermia  . Succinylcholine Other (See Comments)    Malignant hyperthermia    Current Outpatient Medications  Medication Sig Dispense Refill  . furosemide (LASIX) 40 MG tablet Take 40 mg by mouth daily as needed for fluid.     Marland Kitchen lisinopril-hydrochlorothiazide (PRINZIDE,ZESTORETIC) 10-12.5 MG tablet Take 1 tablet by mouth daily.    Marland Kitchen loteprednol (LOTEMAX) 0.5 % ophthalmic suspension Place 1 drop into both eyes at bedtime.    . methocarbamol (ROBAXIN) 500 MG tablet Take 1 tablet (500 mg total) by mouth 3 (three) times daily. 60 tablet 1  . ofloxacin (OCUFLOX) 0.3 % ophthalmic solution Place 1 drop into the left eye 3 (three) times daily.    . pantoprazole (PROTONIX) 40 MG tablet Take 1 tablet (40 mg total) by mouth 2 (two) times daily before a meal. While taking Pylera 30 tablet 0  . prednisoLONE acetate (PRED FORTE) 1 % ophthalmic suspension SMARTSIG:In Eye(s)    . valACYclovir (VALTREX) 1000 MG tablet Take by mouth.    Marland Kitchen HYDROcodone-acetaminophen (NORCO) 7.5-325 MG tablet TAKE ONE TABLET BY MOUTH EVERY 6 HOURS AS NEEDED FOR MODERATE PAIN. 60 tablet 0   No current facility-administered medications for this visit.     Physical Exam  Blood pressure (!) 141/70, pulse 67, height 5\' 9"  (1.753 m), weight 214 lb (97.1 kg).  Constitutional: overall normal hygiene, normal nutrition, well developed, normal grooming, normal body habitus. Assistive device:none  Musculoskeletal: gait and station Limp none, muscle tone and strength are normal, no tremors or atrophy is present.  .  Neurological: coordination overall normal.  Deep tendon reflex/nerve stretch intact.  Sensation normal.  Cranial nerves II-XII intact.   Skin:   Normal overall no scars, lesions, ulcers or rashes. No psoriasis.  Psychiatric: Alert and oriented x 3.  Recent memory intact, remote  memory unclear.  Normal mood and affect. Well groomed.  Good eye contact.  Cardiovascular: overall no swelling, no varicosities, no edema bilaterally, normal temperatures of the legs and arms, no clubbing, cyanosis and good capillary refill.  Lymphatic: palpation is normal.  All other systems reviewed and are negative   The patient has been educated about the nature of the problem(s) and counseled on treatment options.  The patient appeared to understand what I have discussed and is in agreement with it.  Encounter Diagnosis  Name Primary?  . Pain in left hand Yes    PLAN Call if any problems.  Precautions discussed.  Continue current medications.   Return to clinic prn   I have reviewed the [place] web site  prior to prescribing narcotic medicine for this patient.   Electronically Signed Sanjuana Kava, MD 3/24/202210:21 AM

## 2021-01-20 ENCOUNTER — Ambulatory Visit (INDEPENDENT_AMBULATORY_CARE_PROVIDER_SITE_OTHER): Payer: Medicare Other | Admitting: Urology

## 2021-01-20 ENCOUNTER — Encounter: Payer: Self-pay | Admitting: Urology

## 2021-01-20 VITALS — BP 142/71 | HR 60 | Temp 98.5°F | Resp 16 | Ht 69.5 in | Wt 214.0 lb

## 2021-01-20 DIAGNOSIS — N400 Enlarged prostate without lower urinary tract symptoms: Secondary | ICD-10-CM | POA: Diagnosis not present

## 2021-01-20 DIAGNOSIS — C61 Malignant neoplasm of prostate: Secondary | ICD-10-CM

## 2021-01-20 LAB — URINALYSIS, ROUTINE W REFLEX MICROSCOPIC
Bilirubin, UA: NEGATIVE
Glucose, UA: NEGATIVE
Ketones, UA: NEGATIVE
Nitrite, UA: NEGATIVE
Protein,UA: NEGATIVE
Specific Gravity, UA: 1.01 (ref 1.005–1.030)
Urobilinogen, Ur: 0.2 mg/dL (ref 0.2–1.0)
pH, UA: 6 (ref 5.0–7.5)

## 2021-01-20 LAB — MICROSCOPIC EXAMINATION
Epithelial Cells (non renal): NONE SEEN /hpf (ref 0–10)
Renal Epithel, UA: NONE SEEN /hpf
WBC, UA: >30 /hpf — AB (ref 0–5)

## 2021-01-20 MED ORDER — CLOTRIMAZOLE-BETAMETHASONE 1-0.05 % EX CREA: 1.0000 "application " | TOPICAL_CREAM | Freq: Two times a day (BID) | CUTANEOUS | 0 refills | Status: DC

## 2021-01-20 NOTE — Patient Instructions (Signed)
Prostate Cancer  The prostate is a male gland that helps make semen. It is located below a man's bladder, in front of the rectum. Prostate cancer is when abnormal cells grow in this gland. What are the causes? The cause of this condition is not known. What increases the risk? You are more likely to develop this condition if:  You are 85 years of age or older.  You are African American.  You have a family history of prostate cancer.  You have a family history of breast cancer. What are the signs or symptoms? Symptoms of this condition include:  A need to pee often.  Peeing that is weak, or pee that stops and starts.  Trouble starting or stopping your pee.  Inability to pee.  Blood in your pee or semen.  Pain in the lower back, lower belly (abdomen), hips, or upper thighs.  Trouble getting an erection.  Trouble emptying all of your pee. How is this treated? Treatment for this condition depends on your age, your health, the kind of treatment you like, and how far the cancer has spread. Treatments include:  Being watched. This is called observation. You will be tested from time to time, but you will not get treated. Tests are to make sure that the cancer is not growing.  Surgery. This may be done to remove the prostate, to remove the testicles, or to freeze or kill cancer cells.  Radiation. This uses a strong beam to kill cancer cells.  Ultrasound energy. This uses strong sound waves to kill cancer cells.  Chemotherapy. This uses medicines that stop cancer cells from increasing. This kills cancer cells and healthy cells.  Targeted therapy. This kills cancer cells only. Healthy cells are not affected.  Hormone treatment. This stops the body from making hormones that help the cancer cells to grow. Follow these instructions at home:  Take over-the-counter and prescription medicines only as told by your doctor.  Eat a healthy diet.  Get plenty of sleep.  Ask your  doctor for help to find a support group for men with prostate cancer.  If you have to go to the hospital, let your cancer doctor (oncologist) know.  Treatment may affect your ability to have sex. Touch, hold, hug, and caress your partner to have intimate moments.  Keep all follow-up visits as told by your doctor. This is important. Contact a doctor if:  You have new or more trouble peeing.  You have new or more blood in your pee.  You have new or more pain in your hips, back, or chest. Get help right away if:  You have weakness in your legs.  You lose feeling in your legs.  You cannot control your pee or your poop (stool).  You have chills or a fever. Summary  The prostate is a male gland that helps make semen.  Prostate cancer is when abnormal cells grow in this gland.  Treatment includes doing surgery, using medicines, using very strong beams, or watching without treatment.  Ask your doctor for help to find a support group for men with prostate cancer.  Contact a doctor if you have problems peeing or have any new pain that you did not have before. This information is not intended to replace advice given to you by your health care provider. Make sure you discuss any questions you have with your health care provider. Document Revised: 09/29/2019 Document Reviewed: 09/29/2019 Elsevier Patient Education  2021 Elsevier Inc.  

## 2021-01-20 NOTE — Progress Notes (Signed)
01/20/2021 9:08 AM   Tracy Pratt 08/23/1931 [id number]  Referring provider: [name], MD [address] [place],  [place] [address]  Chief Complaint  Patient presents with  . Follow-up    HPI: [name] is a 87yo here for followup for BPH and prostate cancer. PSA in 04/2020 was 3.75 up from 2.43 a year prior. He had brachytherapy in 2008. He has very mild LUTS on no BPH therapy. IPSS 0 with QOL 0.  No bone pain.   His records from [facility] are as follows: 05/19/19: He has returned today for annual prostate cancer follow-up having been treated with brachytherapy in 2008. He has done well. His PSA over the years has begun to show some slight elevation but the doubling time is very long and he currently is asymptomatic with no voiding symptoms, bone pain or weight loss. He does report having some stinging in the area just inside the rectum and has a history of hemorrhoids. He has been using preparation H once a day and says it might be improving slightly.   02/19/20: Patient with above noted hx. He presents today with complaints of left scrotal and testicular pain. He states that symptoms began about 5-6 days ago with moderate to severe pain. He used Hydrocodone X 1 approximatly 3 days ago due to pain. He had Cipro 250 mg at home, which he has been using for the past 5 days with some improvement in pain. He continues to have some mild, persistent pain in the left testicle and scrotum. He denies dysuria, difficulties voiding, or gross hematuria. No fevers or chills. No nausea, vomiting, or changes in bowel habits.   05/12/2020  Patient with the above history. He has a history of prostatitis and epididymo-orchitis that responds well to ciprofloxacin. His symptoms have resolved since his last visit. He has no voiding complaints. He is due for a PSA today   PMH: Past Medical History:  Diagnosis Date  . Adrenal adenoma    Left  . Arthritis   . BPH (benign prostatic hyperplasia)   .  Cataract 2007, 2009   Bilateral  . Colon polyps   . Essential hypertension   . Helicobacter pylori gastritis 09/09/2018  . Malignant hyperthermia   . Prostate cancer ([place]) 08/08/2007   Seed Implant and XRT  . Spondylolisthesis    L5-S1    Surgical History: Past Surgical History:  Procedure Laterality Date  . APPENDECTOMY  1940  . BACK SURGERY  1979  . BIOPSY  08/25/2018   Procedure: BIOPSY;  Surgeon: Gatha Mayer, MD;  [facility]: [facility];  Service: Endoscopy;;  . CATARACT EXTRACTION  2007  . COLONOSCOPY  2012   [facility]: single diminutive hyperplastic polyp in rectum  . CORNEAL TRANSPLANT  2007  . ESOPHAGOGASTRODUODENOSCOPY (EGD) WITH PROPOFOL N/A 08/25/2018   Empiric dilation of esophagus, erythematous mucosa in antrum s/p biopsy, normal duodenum. Clotest positive.  Marland Kitchen KNEE SURGERY  1996   left knee arthoscopic  . MALONEY DILATION  08/25/2018   Procedure: Venia Minks DILATION;  Surgeon: Gatha Mayer, MD;  Location: [facility];  Service: Endoscopy;;  . ROTATOR CUFF REPAIR  2015  . Torn meniscus left knee  1996  . TRANSURETHRAL RESECTION OF PROSTATE  1997    Home Medications:  Allergies as of 01/20/2021      Reactions   Diclofenac Sodium Anaphylaxis   Nabumetone Anaphylaxis   Nsaids Anaphylaxis   Tolmetin Anaphylaxis   Bee Venom Swelling   Desflurane Other (  See Comments)   Malignant hyperthermia   Enflurane Other (See Comments)   Malignant hyperthermia   Halothane Other (See Comments)   Malignant hyperthermia   Isoflurane Other (See Comments)   Malignant hyperthermia   Sevoflurane Other (See Comments)   Malignant hyperthermia   Succinylcholine Other (See Comments)   Malignant hyperthermia      Medication List       Accurate as of January 20, 2021  9:08 AM. If you have any questions, ask your nurse or doctor.        STOP taking these medications   pantoprazole 40 MG tablet Commonly known as: PROTONIX Stopped by: Nicolette Bang, MD     TAKE  these medications   furosemide 40 MG tablet Commonly known as: LASIX Take 40 mg by mouth daily as needed for fluid.   HYDROcodone-acetaminophen 7.5-325 MG tablet Commonly known as: NORCO TAKE ONE TABLET BY MOUTH EVERY 6 HOURS AS NEEDED FOR MODERATE PAIN.   lisinopril-hydrochlorothiazide 10-12.5 MG tablet Commonly known as: ZESTORETIC Take 1 tablet by mouth daily.   loteprednol 0.5 % ophthalmic suspension Commonly known as: LOTEMAX Place 1 drop into both eyes at bedtime.   methocarbamol 500 MG tablet Commonly known as: ROBAXIN Take 1 tablet (500 mg total) by mouth 3 (three) times daily.   ofloxacin 0.3 % ophthalmic solution Commonly known as: OCUFLOX Place 1 drop into the left eye 3 (three) times daily.   prednisoLONE acetate 1 % ophthalmic suspension Commonly known as: PRED FORTE SMARTSIG:In Eye(s)   valACYclovir 1000 MG tablet Commonly known as: VALTREX Take by mouth.       Allergies:  Allergies  Allergen Reactions  . Diclofenac Sodium Anaphylaxis  . Nabumetone Anaphylaxis  . Nsaids Anaphylaxis  . Tolmetin Anaphylaxis  . Bee Venom Swelling  . Desflurane Other (See Comments)    Malignant hyperthermia  . Enflurane Other (See Comments)    Malignant hyperthermia  . Halothane Other (See Comments)    Malignant hyperthermia  . Isoflurane Other (See Comments)    Malignant hyperthermia  . Sevoflurane Other (See Comments)    Malignant hyperthermia  . Succinylcholine Other (See Comments)    Malignant hyperthermia    Family History: Family History  Problem Relation Age of Onset  . Prostate cancer Brother   . Lung disease Brother        lobectomy    Social History:  reports that he has quit smoking. His smoking use included cigarettes. He has a 15.00 pack-year smoking history. He has never used smokeless tobacco. He reports that he does not drink alcohol and does not use drugs.  ROS: All other review of systems were reviewed and are negative except what is  noted above in HPI  Physical Exam: BP (!) 142/71   Pulse 60   Temp 98.5 F (36.9 C) (Oral)   Resp 16   Ht 5' 9.5" (1.765 m)   Wt 214 lb (97.1 kg)   BMI 31.15 kg/m   Constitutional:  Alert and oriented, No acute distress. HEENT: [place] AT, moist mucus membranes.  Trachea midline, no masses. Cardiovascular: No clubbing, cyanosis, or edema. Respiratory: Normal respiratory effort, no increased work of breathing. GI: Abdomen is soft, nontender, nondistended, no abdominal masses GU: No CVA tenderness. Circumcised phallus. No masses/lesions on penis, testis, scrotum. Prostate 20g right lobe firm Lymph: No cervical or inguinal lymphadenopathy. Skin: No rashes, bruises or suspicious lesions. Neurologic: Grossly intact, no focal deficits, moving all 4 extremities. Psychiatric: Normal mood and affect.  Laboratory  Data: Lab Results  Component Value Date   WBC 7.7 07/27/2018   HGB 15.7 07/27/2018   HCT 45.0 07/27/2018   MCV 91.8 07/27/2018   PLT 173 07/27/2018    Lab Results  Component Value Date   CREATININE 1.10 08/19/2018    No results found for: PSA  No results found for: TESTOSTERONE  Lab Results  Component Value Date   HGBA1C 5.8 07/02/2017    Urinalysis    Component Value Date/Time   COLORURINE YELLOW 09/25/2014 1525   APPEARANCEUR CLEAR 09/25/2014 1525   LABSPEC 1.004 (L) 09/25/2014 1525   PHURINE 5.5 09/25/2014 1525   GLUCOSEU NEGATIVE 09/25/2014 1525   HGBUR LARGE (A) 09/25/2014 1525   BILIRUBINUR NEGATIVE 09/25/2014 1525   KETONESUR NEGATIVE 09/25/2014 1525   PROTEINUR NEGATIVE 09/25/2014 1525   UROBILINOGEN 0.2 09/25/2014 1525   NITRITE NEGATIVE 09/25/2014 1525   LEUKOCYTESUR LARGE (A) 09/25/2014 1525    Lab Results  Component Value Date   BACTERIA RARE 09/25/2014    Pertinent Imaging:  No results found for this or any previous visit.  No results found for this or any previous visit.  No results found for this or any previous visit.  No results  found for this or any previous visit.  No results found for this or any previous visit.  No results found for this or any previous visit.  No results found for this or any previous visit.  No results found for this or any previous visit.   Assessment & Plan:    1. Benign prostatic hyperplasia, unspecified whether lower urinary tract symptoms present -observation - Urinalysis, Routine w reflex microscopic  2. Prostate cancer ([place]) -PSA today, will call with results. If it continues to increase we will proceed with metastatic survery   No follow-ups on file.  Nicolette Bang, MD  [facility]

## 2021-01-20 NOTE — Addendum Note (Signed)
Addended by: Cleon Gustin on: 01/20/2021 09:25 AM   Modules accepted: Level of Service

## 2021-01-21 LAB — BASIC METABOLIC PANEL
BUN/Creatinine Ratio: 11 (ref 10–24)
BUN: 13 mg/dL (ref 8–27)
CO2: 24 mmol/L (ref 20–29)
Calcium: 8.7 mg/dL (ref 8.6–10.2)
Chloride: 100 mmol/L (ref 96–106)
Creatinine, Ser: 1.18 mg/dL (ref 0.76–1.27)
Glucose: 91 mg/dL (ref 65–99)
Potassium: 4 mmol/L (ref 3.5–5.2)
Sodium: 139 mmol/L (ref 134–144)
eGFR: 59 mL/min/{1.73_m2} — ABNORMAL LOW (ref >59)

## 2021-01-21 LAB — PSA: Prostate Specific Ag, Serum: 5.1 ng/mL — ABNORMAL HIGH (ref 0.0–4.0)

## 2021-01-24 ENCOUNTER — Other Ambulatory Visit: Payer: Self-pay | Admitting: Urology

## 2021-01-24 ENCOUNTER — Encounter: Payer: Self-pay | Admitting: Urology

## 2021-01-24 ENCOUNTER — Telehealth: Payer: Self-pay

## 2021-01-24 DIAGNOSIS — C61 Malignant neoplasm of prostate: Secondary | ICD-10-CM

## 2021-01-24 NOTE — Telephone Encounter (Signed)
-----   Message from Cleon Gustin, MD sent at 01/24/2021 10:48 AM EDT ----- PSA increased to 5.1. He needs to proceed with CT and bone scan. I will place the orders ----- Message ----- From: Valentina Lucks, LPN Sent: 05/08/6578   8:49 AM EDT To: Cleon Gustin, MD  Pls review.

## 2021-01-24 NOTE — Telephone Encounter (Signed)
Pt called back and was notified of PSA results and tests.

## 2021-01-26 ENCOUNTER — Encounter: Payer: Self-pay | Admitting: Urology

## 2021-01-27 ENCOUNTER — Other Ambulatory Visit: Payer: Self-pay

## 2021-01-27 ENCOUNTER — Telehealth: Payer: Self-pay

## 2021-01-27 DIAGNOSIS — N390 Urinary tract infection, site not specified: Secondary | ICD-10-CM

## 2021-01-27 DIAGNOSIS — R509 Fever, unspecified: Secondary | ICD-10-CM | POA: Diagnosis not present

## 2021-01-27 DIAGNOSIS — Z681 Body mass index (BMI) 19 or less, adult: Secondary | ICD-10-CM | POA: Diagnosis not present

## 2021-01-27 DIAGNOSIS — R319 Hematuria, unspecified: Secondary | ICD-10-CM | POA: Diagnosis not present

## 2021-01-27 DIAGNOSIS — R39198 Other difficulties with micturition: Secondary | ICD-10-CM | POA: Diagnosis not present

## 2021-01-27 DIAGNOSIS — R32 Unspecified urinary incontinence: Secondary | ICD-10-CM | POA: Diagnosis not present

## 2021-01-27 MED ORDER — NITROFURANTOIN MONOHYD MACRO 100 MG PO CAPS: 100.0000 mg | ORAL_CAPSULE | Freq: Two times a day (BID) | ORAL | 0 refills | Status: DC

## 2021-01-27 NOTE — Telephone Encounter (Signed)
Pt was called and notified that antibiotic was sent in for him.

## 2021-03-01 ENCOUNTER — Ambulatory Visit (HOSPITAL_COMMUNITY)
Admission: RE | Admit: 2021-03-01 | Discharge: 2021-03-01 | Disposition: A | Discharge status: Home / Self Care | Payer: Medicare Other | Source: Ambulatory Visit | Attending: Urology | Admitting: Urology

## 2021-03-01 ENCOUNTER — Other Ambulatory Visit: Payer: Self-pay

## 2021-03-01 ENCOUNTER — Encounter (HOSPITAL_COMMUNITY)
Admission: RE | Admit: 2021-03-01 | Discharge: 2021-03-01 | Disposition: A | Discharge status: Home / Self Care | Payer: Medicare Other | Source: Ambulatory Visit | Attending: Urology | Admitting: Urology

## 2021-03-01 ENCOUNTER — Encounter (HOSPITAL_COMMUNITY): Payer: Self-pay

## 2021-03-01 DIAGNOSIS — K7689 Other specified diseases of liver: Secondary | ICD-10-CM | POA: Diagnosis not present

## 2021-03-01 DIAGNOSIS — S2232XA Fracture of one rib, left side, initial encounter for closed fracture: Secondary | ICD-10-CM | POA: Diagnosis not present

## 2021-03-01 DIAGNOSIS — K469 Unspecified abdominal hernia without obstruction or gangrene: Secondary | ICD-10-CM | POA: Diagnosis not present

## 2021-03-01 DIAGNOSIS — K4090 Unilateral inguinal hernia, without obstruction or gangrene, not specified as recurrent: Secondary | ICD-10-CM | POA: Diagnosis not present

## 2021-03-01 DIAGNOSIS — C61 Malignant neoplasm of prostate: Secondary | ICD-10-CM | POA: Diagnosis not present

## 2021-03-01 DIAGNOSIS — M5136 Other intervertebral disc degeneration, lumbar region: Secondary | ICD-10-CM | POA: Diagnosis not present

## 2021-03-01 LAB — POCT I-STAT CREATININE: Creatinine, Ser: 1 mg/dL (ref 0.61–1.24)

## 2021-03-01 MED ORDER — TECHNETIUM TC 99M MEDRONATE IV KIT
20.0000 | PACK | Freq: Once | INTRAVENOUS | Status: AC | PRN
  Administered 2021-03-01: 22 via INTRAVENOUS

## 2021-03-01 MED ORDER — IOHEXOL 300 MG/ML  SOLN
100.0000 mL | Freq: Once | INTRAMUSCULAR | Status: AC | PRN
  Administered 2021-03-01: 100 mL via INTRAVENOUS

## 2021-03-06 ENCOUNTER — Encounter: Payer: Self-pay | Admitting: Urology

## 2021-03-06 ENCOUNTER — Other Ambulatory Visit: Payer: Self-pay

## 2021-03-06 ENCOUNTER — Ambulatory Visit (INDEPENDENT_AMBULATORY_CARE_PROVIDER_SITE_OTHER): Payer: Medicare Other | Admitting: Urology

## 2021-03-06 VITALS — BP 110/67 | HR 93 | Temp 98.4°F

## 2021-03-06 DIAGNOSIS — C61 Malignant neoplasm of prostate: Secondary | ICD-10-CM | POA: Diagnosis not present

## 2021-03-06 DIAGNOSIS — N400 Enlarged prostate without lower urinary tract symptoms: Secondary | ICD-10-CM | POA: Diagnosis not present

## 2021-03-06 DIAGNOSIS — N390 Urinary tract infection, site not specified: Secondary | ICD-10-CM

## 2021-03-06 LAB — URINALYSIS, ROUTINE W REFLEX MICROSCOPIC
Bilirubin, UA: NEGATIVE
Glucose, UA: NEGATIVE
Ketones, UA: NEGATIVE
Leukocytes,UA: NEGATIVE
Nitrite, UA: NEGATIVE
Protein,UA: NEGATIVE
RBC, UA: NEGATIVE
Specific Gravity, UA: 1.02 (ref 1.005–1.030)
Urobilinogen, Ur: 0.2 mg/dL (ref 0.2–1.0)
pH, UA: 6.5 (ref 5.0–7.5)

## 2021-03-06 MED ORDER — CIPROFLOXACIN HCL 250 MG PO TABS: 250.0000 mg | ORAL_TABLET | Freq: Two times a day (BID) | ORAL | 0 refills | Status: DC

## 2021-03-06 MED ORDER — LEVOFLOXACIN 750 MG PO TABS: 750.0000 mg | ORAL_TABLET | Freq: Once | ORAL | 0 refills | Status: DC

## 2021-03-06 MED ORDER — LEVOFLOXACIN 750 MG PO TABS: 750.0000 mg | ORAL_TABLET | Freq: Once | ORAL | 0 refills | Status: AC

## 2021-03-06 NOTE — Progress Notes (Signed)
03/06/2021 12:07 PM   Tracy Pratt May 28, 1931 [id number]  Referring provider: [name], MD [address] [place],  [place] [id number]  followup prostate cancer and BPH  HPI: Tracy Pratt is a 87yo here for followup for prostate cancer. CT and bone scan showed no evidence of metastatic disease. He had a questionable rib lesion but patient has a recent rib injury/fracture. He has mild SUI. UA normal.    PMH: Past Medical History:  Diagnosis Date  . Adrenal adenoma    Left  . Arthritis   . BPH (benign prostatic hyperplasia)   . Cataract 2007, 2009   Bilateral  . Colon polyps   . Essential hypertension   . Helicobacter pylori gastritis 09/09/2018  . Malignant hyperthermia   . Prostate cancer ([place]) 08/08/2007   Seed Implant and XRT  . Spondylolisthesis    L5-S1    Surgical History: Past Surgical History:  Procedure Laterality Date  . APPENDECTOMY  1940  . BACK SURGERY  1979  . BIOPSY  08/25/2018   Procedure: BIOPSY;  Surgeon: Gatha Mayer, MD;  Location: [facility];  Service: Endoscopy;;  . CATARACT EXTRACTION  2007  . COLONOSCOPY  2012   [facility]: single diminutive hyperplastic polyp in rectum  . CORNEAL TRANSPLANT  2007  . ESOPHAGOGASTRODUODENOSCOPY (EGD) WITH PROPOFOL N/A 08/25/2018   Empiric dilation of esophagus, erythematous mucosa in antrum s/p biopsy, normal duodenum. Clotest positive.  Marland Kitchen KNEE SURGERY  1996   left knee arthoscopic  . MALONEY DILATION  08/25/2018   Procedure: Venia Minks DILATION;  Surgeon: Gatha Mayer, MD;  Location: [facility];  Service: Endoscopy;;  . ROTATOR CUFF REPAIR  2015  . Torn meniscus left knee  1996  . TRANSURETHRAL RESECTION OF PROSTATE  1997    Home Medications:  Allergies as of 03/06/2021      Reactions   Diclofenac Sodium Anaphylaxis   Nabumetone Anaphylaxis   Nsaids Anaphylaxis   Tolmetin Anaphylaxis   Bee Venom Swelling   Desflurane Other (See Comments)   Malignant hyperthermia   Enflurane Other  (See Comments)   Malignant hyperthermia   Halothane Other (See Comments)   Malignant hyperthermia   Isoflurane Other (See Comments)   Malignant hyperthermia   Sevoflurane Other (See Comments)   Malignant hyperthermia   Succinylcholine Other (See Comments)   Malignant hyperthermia      Medication List       Accurate as of Mar 06, 2021 12:07 PM. If you have any questions, ask your nurse or doctor.        clotrimazole-betamethasone cream Commonly known as: Lotrisone Apply 1 application topically 2 (two) times daily.   furosemide 40 MG tablet Commonly known as: LASIX Take 40 mg by mouth daily as needed for fluid.   HYDROcodone-acetaminophen 7.5-325 MG tablet Commonly known as: NORCO TAKE ONE TABLET BY MOUTH EVERY 6 HOURS AS NEEDED FOR MODERATE PAIN.   levocetirizine 5 MG tablet Commonly known as: XYZAL Take 5 mg by mouth daily.   lisinopril-hydrochlorothiazide 10-12.5 MG tablet Commonly known as: ZESTORETIC Take 1 tablet by mouth daily.   loteprednol 0.5 % ophthalmic suspension Commonly known as: LOTEMAX Place 1 drop into both eyes at bedtime.   methocarbamol 500 MG tablet Commonly known as: ROBAXIN Take 1 tablet (500 mg total) by mouth 3 (three) times daily.   nitrofurantoin (macrocrystal-monohydrate) 100 MG capsule Commonly known as: MACROBID Take 1 capsule (100 mg total) by mouth every 12 (twelve) hours.   ofloxacin 0.3 % ophthalmic  solution Commonly known as: OCUFLOX Place 1 drop into the left eye 3 (three) times daily.   prednisoLONE acetate 1 % ophthalmic suspension Commonly known as: PRED FORTE SMARTSIG:In Eye(s)   valACYclovir 1000 MG tablet Commonly known as: VALTREX Take by mouth.       Allergies:  Allergies  Allergen Reactions  . Diclofenac Sodium Anaphylaxis  . Nabumetone Anaphylaxis  . Nsaids Anaphylaxis  . Tolmetin Anaphylaxis  . Bee Venom Swelling  . Desflurane Other (See Comments)    Malignant hyperthermia  . Enflurane Other (See  Comments)    Malignant hyperthermia  . Halothane Other (See Comments)    Malignant hyperthermia  . Isoflurane Other (See Comments)    Malignant hyperthermia  . Sevoflurane Other (See Comments)    Malignant hyperthermia  . Succinylcholine Other (See Comments)    Malignant hyperthermia    Family History: Family History  Problem Relation Age of Onset  . Prostate cancer Brother   . Lung disease Brother        lobectomy    Social History:  reports that he has quit smoking. His smoking use included cigarettes. He has a 15.00 pack-year smoking history. He has never used smokeless tobacco. He reports that he does not drink alcohol and does not use drugs.  ROS: All other review of systems were reviewed and are negative except what is noted above in HPI  Physical Exam: BP 110/67   Pulse 93   Temp 98.4 F (36.9 C)   Constitutional:  Alert and oriented, No acute distress. HEENT: [place] AT, moist mucus membranes.  Trachea midline, no masses. Cardiovascular: No clubbing, cyanosis, or edema. Respiratory: Normal respiratory effort, no increased work of breathing. GI: Abdomen is soft, nontender, nondistended, no abdominal masses GU: No CVA tenderness.  Lymph: No cervical or inguinal lymphadenopathy. Skin: No rashes, bruises or suspicious lesions. Neurologic: Grossly intact, no focal deficits, moving all 4 extremities. Psychiatric: Normal mood and affect.  Laboratory Data: Lab Results  Component Value Date   WBC 7.7 07/27/2018   HGB 15.7 07/27/2018   HCT 45.0 07/27/2018   MCV 91.8 07/27/2018   PLT 173 07/27/2018    Lab Results  Component Value Date   CREATININE 1.00 03/01/2021    No results found for: PSA  No results found for: TESTOSTERONE  Lab Results  Component Value Date   HGBA1C 5.8 07/02/2017    Urinalysis    Component Value Date/Time   COLORURINE YELLOW 09/25/2014 1525   APPEARANCEUR Hazy (A) 01/20/2021 0906   LABSPEC 1.004 (L) 09/25/2014 1525   PHURINE 5.5  09/25/2014 1525   GLUCOSEU Negative 01/20/2021 0906   HGBUR LARGE (A) 09/25/2014 1525   BILIRUBINUR Negative 01/20/2021 0906   KETONESUR NEGATIVE 09/25/2014 1525   PROTEINUR Negative 01/20/2021 0906   PROTEINUR NEGATIVE 09/25/2014 1525   UROBILINOGEN 0.2 09/25/2014 1525   NITRITE Negative 01/20/2021 0906   NITRITE NEGATIVE 09/25/2014 1525   LEUKOCYTESUR 3+ (A) 01/20/2021 0906    Lab Results  Component Value Date   LABMICR See below: 01/20/2021   WBCUA >30 (A) 01/20/2021   LABEPIT None seen 01/20/2021   BACTERIA Few (A) 01/20/2021    Pertinent Imaging: CT and Bone scan: Images reviewed and discussed with the patient  No results found for this or any previous visit.  No results found for this or any previous visit.  No results found for this or any previous visit.  No results found for this or any previous visit.  No results found for  this or any previous visit.  No results found for this or any previous visit.  No results found for this or any previous visit.  No results found for this or any previous visit.   Assessment & Plan:    1. Benign prostatic hyperplasia, unspecified whether lower urinary tract symptoms present -we will observe since patient is not bothered by his LUTS - Urinalysis, Routine w reflex microscopic  2. Prostate Cancer -We will schedule for prostate biopsy  No follow-ups on file.  Nicolette Bang, MD  [facility] [place]

## 2021-03-06 NOTE — Progress Notes (Signed)
Urological Symptom Review  Patient is experiencing the following symptoms: Leakage of urine Blood in urine Urinary tract infection   Review of Systems  Gastrointestinal (upper)  : Negative for upper GI symptoms  Gastrointestinal (lower) : Negative for lower GI symptoms  Constitutional : Negative for symptoms  Skin: Negative for skin symptoms  Eyes: Negative for eye symptoms  Ear/Nose/Throat : Negative for Ear/Nose/Throat symptoms  Hematologic/Lymphatic: Negative for Hematologic/Lymphatic symptoms  Cardiovascular : Negative for cardiovascular symptoms  Respiratory : Negative for respiratory symptoms  Endocrine: Negative for endocrine symptoms  Musculoskeletal: Back pain  Neurological: Negative for neurological symptoms  Psychologic: Negative for psychiatric symptoms

## 2021-03-06 NOTE — Patient Instructions (Addendum)
Prostate Cancer  The prostate is a male gland that helps make semen. It is located below a man's bladder, in front of the rectum. Prostate cancer is when abnormal cells grow in this gland. What are the causes? The cause of this condition is not known. What increases the risk? You are more likely to develop this condition if:  You are 85 years of age or older.  You are African American.  You have a family history of prostate cancer.  You have a family history of breast cancer. What are the signs or symptoms? Symptoms of this condition include:  A need to pee often.  Peeing that is weak, or pee that stops and starts.  Trouble starting or stopping your pee.  Inability to pee.  Blood in your pee or semen.  Pain in the lower back, lower belly (abdomen), hips, or upper thighs.  Trouble getting an erection.  Trouble emptying all of your pee. How is this treated? Treatment for this condition depends on your age, your health, the kind of treatment you like, and how far the cancer has spread. Treatments include:  Being watched. This is called observation. You will be tested from time to time, but you will not get treated. Tests are to make sure that the cancer is not growing.  Surgery. This may be done to remove the prostate, to remove the testicles, or to freeze or kill cancer cells.  Radiation. This uses a strong beam to kill cancer cells.  Ultrasound energy. This uses strong sound waves to kill cancer cells.  Chemotherapy. This uses medicines that stop cancer cells from increasing. This kills cancer cells and healthy cells.  Targeted therapy. This kills cancer cells only. Healthy cells are not affected.  Hormone treatment. This stops the body from making hormones that help the cancer cells to grow. Follow these instructions at home:  Take over-the-counter and prescription medicines only as told by your doctor.  Eat a healthy diet.  Get plenty of sleep.  Ask your  doctor for help to find a support group for men with prostate cancer.  If you have to go to the hospital, let your cancer doctor (oncologist) know.  Treatment may affect your ability to have sex. Touch, hold, hug, and caress your partner to have intimate moments.  Keep all follow-up visits as told by your doctor. This is important. Contact a doctor if:  You have new or more trouble peeing.  You have new or more blood in your pee.  You have new or more pain in your hips, back, or chest. Get help right away if:  You have weakness in your legs.  You lose feeling in your legs.  You cannot control your pee or your poop (stool).  You have chills or a fever. Summary  The prostate is a male gland that helps make semen.  Prostate cancer is when abnormal cells grow in this gland.  Treatment includes doing surgery, using medicines, using very strong beams, or watching without treatment.  Ask your doctor for help to find a support group for men with prostate cancer.  Contact a doctor if you have problems peeing or have any new pain that you did not have before. This information is not intended to replace advice given to you by your health care provider. Make sure you discuss any questions you have with your health care provider. Document Revised: 09/29/2019 Document Reviewed: 09/29/2019 Elsevier Patient Education  2021 [employer].  Appointment Time: 12:15 Please arrive by 12 noon Appointment Date:  04/26/21  Location: [facility] Radiology Department   Prostate Biopsy Instructions  Stop all aspirin or blood thinners (aspirin, plavix, coumadin, warfarin, motrin, ibuprofen, advil, aleve, naproxen, naprosyn) for 7 days prior to the procedure.  If you have any questions about stopping these medications, please contact your primary care physician or cardiologist.  Having a light meal prior to the procedure is recommended.  If you are diabetic or have low blood  sugar please bring a small snack or glucose tablet.  A Fleets enema is needed to be purchased over the counter at a local pharmacy and used 2 hours before you scheduled appointment.  This can be purchased over the counter at any pharmacy.  Antibiotics will be administered in the clinic at the time of the procedure and 1 tablet has been sent to your pharmacy. Please take the antibiotic as prescribed.    Please bring someone with you to the procedure to drive you home if you are given a valium to take prior to your procedure.   If you have any questions or concerns, please feel free to call the office at [phone] or send a Mychart message.    Thank you, [facility]

## 2021-03-29 ENCOUNTER — Other Ambulatory Visit: Payer: Medicare Other | Admitting: Urology

## 2021-04-05 ENCOUNTER — Ambulatory Visit: Payer: Medicare Other | Admitting: Urology

## 2021-04-13 ENCOUNTER — Telehealth: Payer: Self-pay | Admitting: Internal Medicine

## 2021-04-13 NOTE — Telephone Encounter (Signed)
Recall for mri °

## 2021-04-13 NOTE — Telephone Encounter (Signed)
Per last result note  "Repeat MRI/MRCP in 1-2 years. Please nic for 1 year, then we can make decisionat that point of repeating then or waiting till 2 year mark. "   Please advise Vicente Males thanks

## 2021-04-24 DIAGNOSIS — Z947 Corneal transplant status: Secondary | ICD-10-CM | POA: Diagnosis not present

## 2021-04-24 DIAGNOSIS — H3561 Retinal hemorrhage, right eye: Secondary | ICD-10-CM | POA: Diagnosis not present

## 2021-04-25 NOTE — Telephone Encounter (Signed)
Please nic for 2023. Multiple other health issues going on currently.

## 2021-04-26 ENCOUNTER — Other Ambulatory Visit: Payer: Self-pay

## 2021-04-26 ENCOUNTER — Ambulatory Visit (HOSPITAL_COMMUNITY)
Admission: RE | Admit: 2021-04-26 | Discharge: 2021-04-26 | Disposition: A | Discharge status: Home / Self Care | Payer: Medicare Other | Source: Ambulatory Visit | Attending: Urology | Admitting: Urology

## 2021-04-26 ENCOUNTER — Ambulatory Visit (INDEPENDENT_AMBULATORY_CARE_PROVIDER_SITE_OTHER): Payer: Medicare Other | Admitting: Urology

## 2021-04-26 ENCOUNTER — Other Ambulatory Visit: Payer: Self-pay | Admitting: Urology

## 2021-04-26 ENCOUNTER — Encounter (HOSPITAL_COMMUNITY): Payer: Self-pay

## 2021-04-26 DIAGNOSIS — C61 Malignant neoplasm of prostate: Secondary | ICD-10-CM

## 2021-04-26 MED ORDER — LIDOCAINE HCL (PF) 2 % IJ SOLN
10.0000 mL | Freq: Once | INTRAMUSCULAR | Status: AC
  Administered 2021-04-26: 10 mL

## 2021-04-26 MED ORDER — LIDOCAINE HCL 2 % IJ SOLN
INTRAMUSCULAR | Status: AC
  Filled 2021-04-26: qty 10

## 2021-04-26 MED ORDER — GENTAMICIN SULFATE 40 MG/ML IJ SOLN
INTRAMUSCULAR | Status: AC
  Administered 2021-04-26: 80 mg via INTRAMUSCULAR
  Filled 2021-04-26: qty 2

## 2021-04-26 MED ORDER — GENTAMICIN SULFATE 40 MG/ML IJ SOLN: 80.0000 mg | Freq: Once | INTRAMUSCULAR | Status: AC

## 2021-04-26 NOTE — Sedation Documentation (Signed)
PT tolerated prostate biopsy procedure and antibiotic injection well today. Labs obtained and sent for pathology. PT ambulatory at discharge with no acute distress noted and verbalized understanding of discharge instructions. PT to follow up with urologist as scheduled.

## 2021-04-26 NOTE — Telephone Encounter (Signed)
On recall for 2023

## 2021-05-02 ENCOUNTER — Encounter: Payer: Self-pay | Admitting: Urology

## 2021-05-02 NOTE — Patient Instructions (Signed)
Transrectal Ultrasound-Guided Prostate Biopsy, Care After This sheet gives you information about how to care for yourself after your procedure. Your doctor may also give you more specific instructions. If youhave problems or questions, contact your doctor. What can I expect after the procedure? After the procedure, it is common to have: Pain and discomfort in your butt, especially while sitting. Pink-colored pee (urine), due to small amounts of blood in the pee. Burning while peeing (urinating). Blood in your poop (stool). Bleeding from your butt. Blood in your semen. Follow these instructions at home: Medicines Take over-the-counter and prescription medicines only as told by your doctor. If you were prescribed antibiotic medicine, take it as told by your doctor. Do not stop taking the antibiotic even if you start to feel better. Activity  Do not drive for 24 hours if you were given a medicine to help you relax (sedative) during your procedure. Return to your normal activities as told by your doctor. Ask your doctor what activities are safe for you. Ask your doctor when it is okay for you to have sex. Do not lift anything that is heavier than 10 lb (4.5 kg), or the limit that you are told, until your doctor says that it is safe.  General instructions  Drink enough water to keep your pee pale yellow. Watch your pee, poop, and semen for new bleeding or bleeding that gets worse. Keep all follow-up visits as told by your doctor. This is important.  Contact a doctor if you: Have blood clots in your pee or poop. Notice that your pee smells bad or unusual. Have very bad belly pain. Have trouble peeing. Notice that your lower belly feels firm. Have blood in your pee for more than 2 weeks after the procedure. Have blood in your semen for more than 2 months after the procedure. Have problems getting an erection. Feel sick to your stomach (nauseous) or throw up (vomit). Have new or worse  bleeding in your pee, poop, or semen. Get help right away if you: Have a fever or chills. Have bright red pee. Have very bad pain that does not get better with medicine. Cannot pee. Summary After this procedure, it is common to have pain and discomfort around your butt, especially while sitting. You may have blood in your pee and poop. It is common to have blood in your semen for 1-2 months. If you were prescribed antibiotic medicine, take it as told by your doctor. Do not stop taking the antibiotic even if you start to feel better. Get help right away if you have a fever or chills. This information is not intended to replace advice given to you by your health care provider. Make sure you discuss any questions you have with your healthcare provider. Document Revised: 08/29/2020 Document Reviewed: 06/30/2020 Elsevier Patient Education  2022 [employer]  

## 2021-05-02 NOTE — Progress Notes (Signed)
Prostate Biopsy Procedure   Informed consent was obtained after discussing risks/benefits of the procedure.  A time out was performed to ensure correct patient identity.  Pre-Procedure: - Last PSA Level: No results found for: PSA - Gentamicin given prophylactically - Levaquin 500 mg administered PO -Transrectal Ultrasound performed revealing a 9.7 gm prostate -No significant hypoechoic or median lobe noted  Procedure: - Prostate block performed using 10 cc 1% lidocaine and biopsies taken from sextant areas, a total of 12 under ultrasound guidance.  Post-Procedure: - Patient tolerated the procedure well - He was counseled to seek immediate medical attention if experiences any severe pain, significant bleeding, or fevers - Return in one week to discuss biopsy results

## 2021-05-03 ENCOUNTER — Ambulatory Visit: Payer: Medicare Other | Admitting: Urology

## 2021-05-10 ENCOUNTER — Other Ambulatory Visit: Payer: Self-pay

## 2021-05-10 ENCOUNTER — Encounter: Payer: Self-pay | Admitting: Urology

## 2021-05-10 ENCOUNTER — Ambulatory Visit (INDEPENDENT_AMBULATORY_CARE_PROVIDER_SITE_OTHER): Payer: Medicare Other | Admitting: Urology

## 2021-05-10 VITALS — BP 149/81 | HR 74 | Temp 98.6°F | Wt 217.8 lb

## 2021-05-10 DIAGNOSIS — C61 Malignant neoplasm of prostate: Secondary | ICD-10-CM

## 2021-05-10 NOTE — Progress Notes (Signed)
05/10/2021 4:14 PM   Tracy Pratt 07-Aug-1931 [id number]  Referring provider: [name], MD [address] [place],  [place] [address]  Followup prostate biopsy  HPI: Tracy Pratt is a 87yo here for followup after prostate biopsy. Biopsy revealed Gleason 4+5=9 in 5/12 cores, Gleason 4+4=8 in 1/12 cores and Gleason 3+4=7 in 1/12 cores. PSA 5.1. He has mild LUTS   PMH: Past Medical History:  Diagnosis Date   Adrenal adenoma    Left   Arthritis    BPH (benign prostatic hyperplasia)    Cataract 2007, 2009   Bilateral   Colon polyps    Essential hypertension    Helicobacter pylori gastritis 09/09/2018   Malignant hyperthermia    Prostate cancer ([place]) 08/08/2007   Seed Implant and XRT   Spondylolisthesis    L5-S1    Surgical History: Past Surgical History:  Procedure Laterality Date   APPENDECTOMY  [place]   BIOPSY  08/25/2018   Procedure: BIOPSY;  Surgeon: Gatha Mayer, MD;  [facility];  Service: Endoscopy;;   CATARACT EXTRACTION  2007   COLONOSCOPY  2012   [facility]: single diminutive hyperplastic polyp in rectum   CORNEAL TRANSPLANT  2007   ESOPHAGOGASTRODUODENOSCOPY (EGD) WITH PROPOFOL N/A 08/25/2018   Empiric dilation of esophagus, erythematous mucosa in antrum s/p biopsy, normal duodenum. Clotest positive.   KNEE SURGERY  1996   left knee arthoscopic   MALONEY DILATION  08/25/2018   Procedure: MALONEY DILATION;  Surgeon: Gatha Mayer, MD;  [facility];  Service: Endoscopy;;   ROTATOR CUFF REPAIR  2015   Torn meniscus left knee  1996   TRANSURETHRAL RESECTION OF PROSTATE  1997    Home Medications:  Allergies as of 05/10/2021       Reactions   Diclofenac Sodium Anaphylaxis   Nabumetone Anaphylaxis   Nsaids Anaphylaxis   Tolmetin Anaphylaxis   Bee Venom Swelling   Desflurane Other (See Comments)   Malignant hyperthermia   Enflurane Other (See Comments)   Malignant hyperthermia   Halothane Other  (See Comments)   Malignant hyperthermia   Isoflurane Other (See Comments)   Malignant hyperthermia   Sevoflurane Other (See Comments)   Malignant hyperthermia   Succinylcholine Other (See Comments)   Malignant hyperthermia        Medication List        Accurate as of May 10, 2021  4:14 PM. If you have any questions, ask your nurse or doctor.          ciprofloxacin 250 MG tablet Commonly known as: Cipro Take 1 tablet (250 mg total) by mouth 2 (two) times daily. To take on vacation if infection develops.   clotrimazole-betamethasone cream Commonly known as: Lotrisone Apply 1 application topically 2 (two) times daily.   furosemide 40 MG tablet Commonly known as: LASIX Take 40 mg by mouth daily as needed for fluid.   HYDROcodone-acetaminophen 7.5-325 MG tablet Commonly known as: NORCO TAKE ONE TABLET BY MOUTH EVERY 6 HOURS AS NEEDED FOR MODERATE PAIN.   levocetirizine 5 MG tablet Commonly known as: XYZAL Take 5 mg by mouth daily.   lisinopril-hydrochlorothiazide 10-12.5 MG tablet Commonly known as: ZESTORETIC Take 1 tablet by mouth daily.   loteprednol 0.5 % ophthalmic suspension Commonly known as: LOTEMAX Place 1 drop into both eyes at bedtime.   methocarbamol 500 MG tablet Commonly known as: ROBAXIN Take 1 tablet (500 mg total) by mouth 3 (three) times daily.  nitrofurantoin (macrocrystal-monohydrate) 100 MG capsule Commonly known as: MACROBID Take 1 capsule (100 mg total) by mouth every 12 (twelve) hours.   ofloxacin 0.3 % ophthalmic solution Commonly known as: OCUFLOX Place 1 drop into the left eye 3 (three) times daily.   prednisoLONE acetate 1 % ophthalmic suspension Commonly known as: PRED FORTE SMARTSIG:In Eye(s)   valACYclovir 1000 MG tablet Commonly known as: VALTREX Take by mouth.        Allergies:  Allergies  Allergen Reactions   Diclofenac Sodium Anaphylaxis   Nabumetone Anaphylaxis   Nsaids Anaphylaxis   Tolmetin Anaphylaxis    Bee Venom Swelling   Desflurane Other (See Comments)    Malignant hyperthermia   Enflurane Other (See Comments)    Malignant hyperthermia   Halothane Other (See Comments)    Malignant hyperthermia   Isoflurane Other (See Comments)    Malignant hyperthermia   Sevoflurane Other (See Comments)    Malignant hyperthermia   Succinylcholine Other (See Comments)    Malignant hyperthermia    Family History: Family History  Problem Relation Age of Onset   Prostate cancer Brother    Lung disease Brother        lobectomy    Social History:  reports that he has quit smoking. His smoking use included cigarettes. He has a 15.00 pack-year smoking history. He has never used smokeless tobacco. He reports that he does not drink alcohol and does not use drugs.  ROS: All other review of systems were reviewed and are negative except what is noted above in HPI  Physical Exam: BP (!) 149/81   Pulse 74   Temp 98.6 F (37 C)   Wt 217 lb 12.8 oz (98.8 kg)   BMI 31.70 kg/m   Constitutional:  Alert and oriented, No acute distress. HEENT: [place] AT, moist mucus membranes.  Trachea midline, no masses. Cardiovascular: No clubbing, cyanosis, or edema. Respiratory: Normal respiratory effort, no increased work of breathing. GI: Abdomen is soft, nontender, nondistended, no abdominal masses GU: No CVA tenderness.  Lymph: No cervical or inguinal lymphadenopathy. Skin: No rashes, bruises or suspicious lesions. Neurologic: Grossly intact, no focal deficits, moving all 4 extremities. Psychiatric: Normal mood and affect.  Laboratory Data: Lab Results  Component Value Date   WBC 7.7 07/27/2018   HGB 15.7 07/27/2018   HCT 45.0 07/27/2018   MCV 91.8 07/27/2018   PLT 173 07/27/2018    Lab Results  Component Value Date   CREATININE 1.00 03/01/2021    No results found for: PSA  No results found for: TESTOSTERONE  Lab Results  Component Value Date   HGBA1C 5.8 07/02/2017    Urinalysis     Component Value Date/Time   COLORURINE YELLOW 09/25/2014 1525   APPEARANCEUR Clear 03/06/2021 1157   LABSPEC 1.004 (L) 09/25/2014 1525   PHURINE 5.5 09/25/2014 1525   GLUCOSEU Negative 03/06/2021 1157   HGBUR LARGE (A) 09/25/2014 1525   BILIRUBINUR Negative 03/06/2021 1157   KETONESUR NEGATIVE 09/25/2014 1525   PROTEINUR Negative 03/06/2021 1157   PROTEINUR NEGATIVE 09/25/2014 1525   UROBILINOGEN 0.2 09/25/2014 1525   NITRITE Negative 03/06/2021 1157   NITRITE NEGATIVE 09/25/2014 1525   LEUKOCYTESUR Negative 03/06/2021 1157    Lab Results  Component Value Date   LABMICR Comment 03/06/2021   WBCUA >30 (A) 01/20/2021   LABEPIT None seen 01/20/2021   BACTERIA Few (A) 01/20/2021    Pertinent Imaging:  No results found for this or any previous visit.  No results found for this  or any previous visit.  No results found for this or any previous visit.  No results found for this or any previous visit.  No results found for this or any previous visit.  No results found for this or any previous visit.  No results found for this or any previous visit.  No results found for this or any previous visit.   Assessment & Plan:    1. Prostate cancer [facility]) I discussed the natural history of high risk prostate cancer with the patient and the various treatment options including active surveillance, cryotherapy, HIFU and ADT. After discussing the options the patient elects for cryotherapy.  - Urinalysis, Routine w reflex microscopic   No follow-ups on file.  Nicolette Bang, MD  [facility] [place]

## 2021-05-10 NOTE — Patient Instructions (Signed)
Prostate Cancer  The prostate is a male gland that helps make semen. It is located below a man's bladder, in front of the rectum. Prostate cancer is when abnormal cells grow inthis gland. What are the causes? The cause of this condition is not known. What increases the risk? You are more likely to develop this condition if: You are 85 years of age or older. You are African American. You have a family history of prostate cancer. You have a family history of breast cancer. What are the signs or symptoms? Symptoms of this condition include: A need to pee often. Peeing that is weak, or pee that stops and starts. Trouble starting or stopping your pee. Inability to pee. Blood in your pee or semen. Pain in the lower back, lower belly (abdomen), hips, or upper thighs. Trouble getting an erection. Trouble emptying all of your pee. How is this treated? Treatment for this condition depends on your age, your health, the kind of treatment you like, and how far the cancer has spread. Treatments include: Being watched. This is called observation. You will be tested from time to time, but you will not get treated. Tests are to make sure that the cancer is not growing. Surgery. This may be done to remove the prostate, to remove the testicles, or to freeze or kill cancer cells. Radiation. This uses a strong beam to kill cancer cells. Ultrasound energy. This uses strong sound waves to kill cancer cells. Chemotherapy. This uses medicines that stop cancer cells from increasing. This kills cancer cells and healthy cells. Targeted therapy. This kills cancer cells only. Healthy cells are not affected. Hormone treatment. This stops the body from making hormones that help the cancer cells to grow. Follow these instructions at home: Take over-the-counter and prescription medicines only as told by your doctor. Eat a healthy diet. Get plenty of sleep. Ask your doctor for help to find a support group for men  with prostate cancer. If you have to go to the hospital, let your cancer doctor (oncologist) know. Treatment may affect your ability to have sex. Touch, hold, hug, and caress your partner to have intimate moments. Keep all follow-up visits as told by your doctor. This is important. Contact a doctor if: You have new or more trouble peeing. You have new or more blood in your pee. You have new or more pain in your hips, back, or chest. Get help right away if: You have weakness in your legs. You lose feeling in your legs. You cannot control your pee or your poop (stool). You have chills or a fever. Summary The prostate is a male gland that helps make semen. Prostate cancer is when abnormal cells grow in this gland. Treatment includes doing surgery, using medicines, using very strong beams, or watching without treatment. Ask your doctor for help to find a support group for men with prostate cancer. Contact a doctor if you have problems peeing or have any new pain that you did not have before. This information is not intended to replace advice given to you by your health care provider. Make sure you discuss any questions you have with your healthcare provider. Document Revised: 11/17/2020 Document Reviewed: 09/29/2019 Elsevier Patient Education  2022 Elsevier Inc.  

## 2021-05-15 DIAGNOSIS — L570 Actinic keratosis: Secondary | ICD-10-CM | POA: Diagnosis not present

## 2021-05-15 DIAGNOSIS — D044 Carcinoma in situ of skin of scalp and neck: Secondary | ICD-10-CM | POA: Diagnosis not present

## 2021-06-02 NOTE — Progress Notes (Signed)
DUE TO COVID-19 ONLY ONE VISITOR IS ALLOWED TO COME WITH YOU AND STAY IN THE WAITING ROOM ONLY DURING PRE OP AND PROCEDURE DAY OF SURGERY. THE 1 VISITOR  MAY VISIT WITH YOU AFTER SURGERY IN YOUR PRIVATE ROOM DURING VISITING HOURS ONLY!  YOU NEED TO HAVE A COVID 19 TEST ON_______ '@_______'$ , THIS TEST MUST BE DONE BEFORE SURGERY,  COVID TESTING SITE [address] [place] [address], IT IS ON THE RIGHT GOING OUT [address] APPROXIMATELY  2 MINUTES PAST [employer] ON THE RIGHT. ONCE YOUR COVID TEST IS COMPLETED,  PLEASE BEGIN THE QUARANTINE INSTRUCTIONS AS OUTLINED IN YOUR HANDOUT.                Tracy Pratt  06/02/2021   Your procedure is scheduled on:       06/09/2021   Report to [facility] Main  Entrance   Report to admitting at     0600AM     Call this number if you have problems the morning of surgery [phone]    Remember: Do not eat food , candy gum or mints :After Midnight. You may have clear liquids from midnight until  0500am    CLEAR LIQUID DIET   Foods Allowed                                                                       Coffee and tea, regular and decaf                              Plain Jell-O any favor except red or purple                                            Fruit ices (not with fruit pulp)                                      Iced Popsicles                                     Carbonated beverages, regular and diet                                    Cranberry, grape and apple juices Sports drinks like Gatorade Lightly seasoned clear broth or consume(fat free) Sugar, honey syrup   _____________________________________________________________________    BRUSH YOUR TEETH MORNING OF SURGERY AND RINSE YOUR MOUTH OUT, NO CHEWING GUM CANDY OR MINTS.     Take these medicines the morning of surgery with A SIP OF WATER:  xyzal   DO NOT TAKE ANY DIABETIC MEDICATIONS DAY OF YOUR SURGERY                                You may  not have any metal on your body including hair pins and              piercings  Do not wear jewelry, make-up, lotions, powders or perfumes, deodorant             Do not wear nail polish on your fingernails.  Do not shave  48 hours prior to surgery.              Men may shave face and neck.   Do not bring valuables to the hospital. [place].  Contacts, dentures or bridgework may not be worn into surgery.  Leave suitcase in the car. After surgery it may be brought to your room.     Patients discharged the day of surgery will not be allowed to drive home. IF YOU ARE HAVING SURGERY AND GOING HOME THE SAME DAY, YOU MUST HAVE AN ADULT TO DRIVE YOU HOME AND BE WITH YOU FOR 24 HOURS. YOU MAY GO HOME BY TAXI OR UBER OR ORTHERWISE, BUT AN ADULT MUST ACCOMPANY YOU HOME AND STAY WITH YOU FOR 24 HOURS.  Name and phone number of your driver:  Special Instructions: N/A              Please read over the following fact sheets you were given: _____________________________________________________________________  [facility] - Preparing for Surgery Before surgery, you can play an important role.  Because skin is not sterile, your skin needs to be as free of germs as possible.  You can reduce the number of germs on your skin by washing with CHG (chlorahexidine gluconate) soap before surgery.  CHG is an antiseptic cleaner which kills germs and bonds with the skin to continue killing germs even after washing. Please DO NOT use if you have an allergy to CHG or antibacterial soaps.  If your skin becomes reddened/irritated stop using the CHG and inform your nurse when you arrive at Short Stay. Do not shave (including legs and underarms) for at least 48 hours prior to the first CHG shower.  You may shave your face/neck. Please follow these instructions carefully:  1.  Shower with CHG Soap the night before surgery and the  morning of Surgery.  2.  If you choose  to wash your hair, wash your hair first as usual with your  normal  shampoo.  3.  After you shampoo, rinse your hair and body thoroughly to remove the  shampoo.                           4.  Use CHG as you would any other liquid soap.  You can apply chg directly  to the skin and wash                       Gently with a scrungie or clean washcloth.  5.  Apply the CHG Soap to your body ONLY FROM THE NECK DOWN.   Do not use on face/ open                           Wound or open sores. Avoid contact with eyes, ears mouth and genitals (private parts).  Wash face,  Genitals (private parts) with your normal soap.             6.  Wash thoroughly, paying special attention to the area where your surgery  will be performed.  7.  Thoroughly rinse your body with warm water from the neck down.  8.  DO NOT shower/wash with your normal soap after using and rinsing off  the CHG Soap.                9.  Pat yourself dry with a clean towel.            10.  Wear clean pajamas.            11.  Place clean sheets on your bed the night of your first shower and do not  sleep with pets. Day of Surgery : Do not apply any lotions/deodorants the morning of surgery.  Please wear clean clothes to the hospital/surgery center.  FAILURE TO FOLLOW THESE INSTRUCTIONS MAY RESULT IN THE CANCELLATION OF YOUR SURGERY PATIENT SIGNATURE_________________________________  NURSE SIGNATURE__________________________________  ________________________________________________________________________

## 2021-06-05 ENCOUNTER — Other Ambulatory Visit: Payer: Self-pay

## 2021-06-05 ENCOUNTER — Ambulatory Visit: Payer: Medicare Other | Admitting: Urology

## 2021-06-05 ENCOUNTER — Encounter: Payer: Self-pay | Admitting: Urology

## 2021-06-05 ENCOUNTER — Encounter (HOSPITAL_COMMUNITY): Payer: Self-pay

## 2021-06-05 ENCOUNTER — Encounter (HOSPITAL_COMMUNITY)
Admission: RE | Admit: 2021-06-05 | Discharge: 2021-06-05 | Disposition: A | Discharge status: Home / Self Care | Payer: Medicare Other | Source: Ambulatory Visit | Attending: Urology | Admitting: Urology

## 2021-06-05 DIAGNOSIS — Z01818 Encounter for other preprocedural examination: Secondary | ICD-10-CM | POA: Insufficient documentation

## 2021-06-05 HISTORY — DX: Cardiac murmur, unspecified: R01.1

## 2021-06-05 LAB — CBC
HCT: 49.5 % (ref 39.0–52.0)
Hemoglobin: 16.9 g/dL (ref 13.0–17.0)
MCH: 32.7 pg (ref 26.0–34.0)
MCHC: 34.1 g/dL (ref 30.0–36.0)
MCV: 95.7 fL (ref 80.0–100.0)
Platelets: 199 10*3/uL (ref 150–400)
RBC: 5.17 MIL/uL (ref 4.22–5.81)
RDW: 13 % (ref 11.5–15.5)
WBC: 7.7 10*3/uL (ref 4.0–10.5)
nRBC: 0 % (ref 0.0–0.2)

## 2021-06-05 LAB — BASIC METABOLIC PANEL
Anion gap: 9 (ref 5–15)
BUN: 17 mg/dL (ref 8–23)
CO2: 26 mmol/L (ref 22–32)
Calcium: 9 mg/dL (ref 8.9–10.3)
Chloride: 104 mmol/L (ref 98–111)
Creatinine, Ser: 1.19 mg/dL (ref 0.61–1.24)
GFR, Estimated: 59 mL/min — ABNORMAL LOW (ref >60)
Glucose, Bld: 88 mg/dL (ref 70–99)
Potassium: 4.2 mmol/L (ref 3.5–5.1)
Sodium: 139 mmol/L (ref 135–145)

## 2021-06-05 NOTE — Progress Notes (Addendum)
Anesthesia Review:  PCP: DR Hilma Favors [phone]  Requested LOV note .   Cardiologist : Bernerd Pho, [place] 07/02/2019  DR Johnny Bridge  Chest x-ray : EKG : 06/05/21 Echo : Stress test: Cardiac Cath :  Activity level: can do a fight of stairs without difficuilty  Sleep Study/ CPAP : none  Fasting Blood Sugar :      / Checks Blood Sugar -- times a day:   Blood Thinner/ Instructions /Last Dose: ASA / Instructions/ Last Dose :   MALIGNANT HYPERTHERMIA- Konrad Felix, PA made aware at preop .  Has paperwork.

## 2021-06-06 NOTE — Anesthesia Preprocedure Evaluation (Addendum)
Anesthesia Evaluation  Patient identified by MRN, date of birth, ID band Patient awake    Reviewed: Allergy & Precautions, NPO status , Patient's Chart, lab work & pertinent test results  History of Anesthesia Complications (+) MALIGNANT HYPERTHERMIA and history of anesthetic complications  Airway Mallampati: III  TM Distance: >3 FB Neck ROM: Full    Dental  (+) Missing,    Pulmonary former smoker,    breath sounds clear to auscultation       Cardiovascular hypertension, Pt. on medications  Rhythm:Regular Rate:Normal     Neuro/Psych negative neurological ROS  negative psych ROS   GI/Hepatic negative GI ROS, Neg liver ROS,   Endo/Other  Hypothyroidism   Renal/GU negative Renal ROS     Musculoskeletal  (+) Arthritis ,   Abdominal Normal abdominal exam  (+)   Peds  Hematology negative hematology ROS (+)   Anesthesia Other Findings   Reproductive/Obstetrics                           Anesthesia Physical Anesthesia Plan  ASA: 3  Anesthesia Plan: General   Post-op Pain Management:    Induction: Intravenous  PONV Risk Score and Plan: 3 and Ondansetron, Dexamethasone and TIVA  Airway Management Planned: LMA  Additional Equipment: None  Intra-op Plan:   Post-operative Plan: Extubation in OR  Informed Consent: I have reviewed the patients History and Physical, chart, labs and discussed the procedure including the risks, benefits and alternatives for the proposed anesthesia with the patient or authorized representative who has indicated his/her understanding and acceptance.     Dental advisory given  Plan Discussed with: CRNA  Anesthesia Plan Comments: (H/o Malignant Hyperthermia, confirmed by muscle biopsy  See PAT note 06/05/2021, Konrad Felix, PA-C)     Anesthesia Quick Evaluation

## 2021-06-06 NOTE — Progress Notes (Signed)
Anesthesia Chart Review   Case: [id number] Date/Time: 06/09/21 0745   Procedure: CRYO ABLATION PROSTATE   Anesthesia type: General   Pre-op diagnosis: prostate cancer   [facility]: [facility] ROOM 03 / [facility]   Surgeons: Cleon Gustin, MD       DISCUSSION:87 y.o. former smoker with h/o MH, HTN, prostate cancer, scheduled for above procedure 06/09/2021 with Dr. Nicolette Bang.   Pt with h/o Malignant Hyperthermia, confirmed susceptible via muscle biopsy in 1995. Pt is currently scheduled as first case.  Anesthesia made aware.   VS: BP (!) 143/77   Pulse 71   Temp 36.8 C (Oral)   Resp 16   Ht 5' 9.5" (1.765 m)   Wt 98.4 kg   SpO2 97%   BMI 31.59 kg/m   PROVIDERS: [name], MD   LABS: Labs reviewed: Acceptable for surgery. (all labs ordered are listed, but only abnormal results are displayed)  Labs Reviewed  BASIC METABOLIC PANEL - Abnormal; Notable for the following components:      Result Value   GFR, Estimated 59 (*)    All other components within normal limits  CBC     IMAGES:   EKG: 06/05/2021 Rate 65 bpm  Sinus rhythm with occasional Premature ventricular complexes Left axis deviation Incomplete right bundle branch block Inferior infarct , age undetermined Abnormal ECG No significant change since last tracing  CV: Stress Test 07/17/2019 There was no ST segment deviation noted during stress. RBBB seen throughout study. No T wave inversion was noted during stress. The study is normal. This is a low risk study. Nuclear stress EF: 69%.  Echo 06/27/2017 - Left ventricle: The cavity size was normal. Wall thickness was at    the upper limits of normal. Systolic function was vigorous. The    estimated ejection fraction was in the range of 65% to 70%. Wall    motion was normal; there were no regional wall motion    abnormalities. Doppler parameters are consistent with abnormal    left ventricular relaxation (grade 1 diastolic dysfunction).  - Aortic valve:  Mildly calcified annulus. Trileaflet. There was no    stenosis. Mean gradient (S): 6 mm Hg. Peak gradient (S): 14 mm    Hg. Valve area (VTI): 2.99 cm^2.  - Mitral valve: There was trivial regurgitation.  - Right atrium: Central venous pressure (est): 3 mm Hg.  - Atrial septum: No defect or patent foramen ovale was identified.  - Tricuspid valve: There was trivial regurgitation.  - Pulmonary arteries: PA peak pressure: 25 mm Hg (S).  - Pericardium, extracardiac: There was no pericardial effusion.  Past Medical History:  Diagnosis Date   Adrenal adenoma    Left   Arthritis    BPH (benign prostatic hyperplasia)    Cataract 2007, 2009   Bilateral   Colon polyps    Essential hypertension    Heart murmur    HX OF 30 YEARS AGO   Helicobacter pylori gastritis 09/09/2018   Malignant hyperthermia    Prostate cancer ([place]) 08/08/2007   Seed Implant and XRT   Spondylolisthesis    L5-S1    Past Surgical History:  Procedure Laterality Date   APPENDECTOMY  [place]   BIOPSY  08/25/2018   Procedure: BIOPSY;  Surgeon: Gatha Mayer, MD;  [facility];  Service: Endoscopy;;   CATARACT EXTRACTION  2007   COLONOSCOPY  2012   [facility]: single diminutive hyperplastic polyp in rectum  CORNEAL TRANSPLANT  2007   ESOPHAGOGASTRODUODENOSCOPY (EGD) WITH PROPOFOL N/A 08/25/2018   Empiric dilation of esophagus, erythematous mucosa in antrum s/p biopsy, normal duodenum. Clotest positive.   KNEE SURGERY  1996   left knee arthoscopic   MALONEY DILATION  08/25/2018   Procedure: MALONEY DILATION;  Surgeon: Gatha Mayer, MD;  [facility];  Service: Endoscopy;;   ROTATOR CUFF REPAIR  2015   Torn meniscus left knee  1996   TRANSURETHRAL RESECTION OF PROSTATE  1997    MEDICATIONS:  ciprofloxacin (CIPRO) 250 MG tablet   clotrimazole-betamethasone (LOTRISONE) cream   HYDROcodone-acetaminophen (NORCO) 7.5-325 MG tablet   levocetirizine (XYZAL) 5 MG tablet    lisinopril-hydrochlorothiazide (PRINZIDE,ZESTORETIC) 10-12.5 MG tablet   loteprednol (LOTEMAX) 0.5 % ophthalmic suspension   methocarbamol (ROBAXIN) 500 MG tablet   nitrofurantoin, macrocrystal-monohydrate, (MACROBID) 100 MG capsule   prednisoLONE acetate (PRED FORTE) 1 % ophthalmic suspension   valACYclovir (VALTREX) 1000 MG tablet   No current facility-administered medications for this encounter.   Konrad Felix, PA-C [facility] Pre-Surgical Testing [phone]

## 2021-06-09 ENCOUNTER — Encounter (HOSPITAL_COMMUNITY): Payer: Self-pay | Admitting: Urology

## 2021-06-09 ENCOUNTER — Encounter (HOSPITAL_COMMUNITY)
Admission: RE | Disposition: A | Discharge status: Home / Self Care | Payer: Self-pay | Source: Home / Self Care | Attending: Urology

## 2021-06-09 ENCOUNTER — Ambulatory Visit (HOSPITAL_COMMUNITY)
Admission: RE | Admit: 2021-06-09 | Discharge: 2021-06-09 | Disposition: A | Discharge status: Home / Self Care | Payer: Medicare Other | Attending: Urology | Admitting: Urology

## 2021-06-09 ENCOUNTER — Ambulatory Visit (HOSPITAL_COMMUNITY): Payer: Medicare Other | Admitting: Certified Registered"

## 2021-06-09 ENCOUNTER — Ambulatory Visit (HOSPITAL_COMMUNITY): Payer: Medicare Other | Admitting: Physician Assistant

## 2021-06-09 DIAGNOSIS — C61 Malignant neoplasm of prostate: Secondary | ICD-10-CM | POA: Insufficient documentation

## 2021-06-09 DIAGNOSIS — Z87891 Personal history of nicotine dependence: Secondary | ICD-10-CM | POA: Insufficient documentation

## 2021-06-09 DIAGNOSIS — Z87892 Personal history of anaphylaxis: Secondary | ICD-10-CM | POA: Insufficient documentation

## 2021-06-09 DIAGNOSIS — I10 Essential (primary) hypertension: Secondary | ICD-10-CM | POA: Diagnosis not present

## 2021-06-09 DIAGNOSIS — Z886 Allergy status to analgesic agent status: Secondary | ICD-10-CM | POA: Insufficient documentation

## 2021-06-09 DIAGNOSIS — Z91030 Bee allergy status: Secondary | ICD-10-CM | POA: Insufficient documentation

## 2021-06-09 DIAGNOSIS — R7303 Prediabetes: Secondary | ICD-10-CM | POA: Diagnosis not present

## 2021-06-09 DIAGNOSIS — E02 Subclinical iodine-deficiency hypothyroidism: Secondary | ICD-10-CM | POA: Diagnosis not present

## 2021-06-09 HISTORY — PX: CRYOABLATION: SHX1415

## 2021-06-09 SURGERY — CRYOABLATION, PROSTATE
Anesthesia: General

## 2021-06-09 MED ORDER — ROCURONIUM BROMIDE 100 MG/10ML IV SOLN
INTRAVENOUS | Status: DC | PRN
  Administered 2021-06-09: 60 mg via INTRAVENOUS
  Administered 2021-06-09: 5 mg via INTRAVENOUS

## 2021-06-09 MED ORDER — CEFAZOLIN SODIUM-DEXTROSE 2-4 GM/100ML-% IV SOLN
2.0000 g | INTRAVENOUS | Status: AC
  Administered 2021-06-09: 2 g via INTRAVENOUS
  Filled 2021-06-09: qty 100

## 2021-06-09 MED ORDER — BACITRACIN ZINC 500 UNIT/GM EX OINT
TOPICAL_OINTMENT | CUTANEOUS | Status: AC
  Filled 2021-06-09: qty 28.35

## 2021-06-09 MED ORDER — DEXAMETHASONE SODIUM PHOSPHATE 10 MG/ML IJ SOLN
INTRAMUSCULAR | Status: DC | PRN
  Administered 2021-06-09: 4 mg via INTRAVENOUS

## 2021-06-09 MED ORDER — CHLORHEXIDINE GLUCONATE 0.12 % MT SOLN
15.0000 mL | Freq: Once | OROMUCOSAL | Status: AC
  Administered 2021-06-09: 15 mL via OROMUCOSAL

## 2021-06-09 MED ORDER — ROCURONIUM BROMIDE 10 MG/ML (PF) SYRINGE
PREFILLED_SYRINGE | INTRAVENOUS | Status: AC
  Filled 2021-06-09: qty 10

## 2021-06-09 MED ORDER — LIDOCAINE 2% (20 MG/ML) 5 ML SYRINGE
INTRAMUSCULAR | Status: AC
  Filled 2021-06-09: qty 5

## 2021-06-09 MED ORDER — EPHEDRINE SULFATE-NACL 50-0.9 MG/10ML-% IV SOSY
PREFILLED_SYRINGE | INTRAVENOUS | Status: DC | PRN
  Administered 2021-06-09 (×8): 5 mg via INTRAVENOUS

## 2021-06-09 MED ORDER — ONDANSETRON HCL 4 MG/2ML IJ SOLN
INTRAMUSCULAR | Status: DC | PRN
  Administered 2021-06-09: 4 mg via INTRAVENOUS

## 2021-06-09 MED ORDER — SUGAMMADEX SODIUM 200 MG/2ML IV SOLN
INTRAVENOUS | Status: DC | PRN
  Administered 2021-06-09: 200 mg via INTRAVENOUS

## 2021-06-09 MED ORDER — LACTATED RINGERS IV SOLN: INTRAVENOUS | Status: DC

## 2021-06-09 MED ORDER — SODIUM CHLORIDE 0.9 % IR SOLN
Status: DC | PRN
  Administered 2021-06-09: 2000 mL

## 2021-06-09 MED ORDER — CHLORHEXIDINE GLUCONATE 4 % EX LIQD: Freq: Once | CUTANEOUS | Status: DC

## 2021-06-09 MED ORDER — PROPOFOL 10 MG/ML IV BOLUS
INTRAVENOUS | Status: AC
  Filled 2021-06-09: qty 40

## 2021-06-09 MED ORDER — FENTANYL CITRATE (PF) 100 MCG/2ML IJ SOLN
INTRAMUSCULAR | Status: AC
  Filled 2021-06-09: qty 2

## 2021-06-09 MED ORDER — HYDROCODONE-ACETAMINOPHEN 7.5-325 MG PO TABS: 1.0000 | ORAL_TABLET | Freq: Four times a day (QID) | ORAL | 0 refills | Status: DC | PRN

## 2021-06-09 MED ORDER — 0.9 % SODIUM CHLORIDE (POUR BTL) OPTIME
TOPICAL | Status: DC | PRN
  Administered 2021-06-09: 1000 mL

## 2021-06-09 MED ORDER — PROPOFOL 500 MG/50ML IV EMUL
INTRAVENOUS | Status: DC | PRN
  Administered 2021-06-09: 150 ug/kg/min via INTRAVENOUS

## 2021-06-09 MED ORDER — FLEET ENEMA 7-19 GM/118ML RE ENEM
1.0000 | ENEMA | Freq: Once | RECTAL | Status: DC
  Filled 2021-06-09: qty 1

## 2021-06-09 MED ORDER — PROPOFOL 10 MG/ML IV BOLUS
INTRAVENOUS | Status: DC | PRN
  Administered 2021-06-09: 30 mg via INTRAVENOUS
  Administered 2021-06-09: 20 mg via INTRAVENOUS
  Administered 2021-06-09: 150 mg via INTRAVENOUS

## 2021-06-09 MED ORDER — BACITRACIN ZINC 500 UNIT/GM EX OINT
TOPICAL_OINTMENT | CUTANEOUS | Status: DC | PRN
  Administered 2021-06-09: 1 via TOPICAL

## 2021-06-09 MED ORDER — FENTANYL CITRATE (PF) 100 MCG/2ML IJ SOLN
INTRAMUSCULAR | Status: DC | PRN
  Administered 2021-06-09 (×3): 25 ug via INTRAVENOUS

## 2021-06-09 MED ORDER — STERILE WATER FOR IRRIGATION IR SOLN
Status: DC | PRN
  Administered 2021-06-09: 500 mL

## 2021-06-09 MED ORDER — FENTANYL CITRATE (PF) 100 MCG/2ML IJ SOLN: 25.0000 ug | INTRAMUSCULAR | Status: DC | PRN

## 2021-06-09 MED ORDER — LIDOCAINE 2% (20 MG/ML) 5 ML SYRINGE
INTRAMUSCULAR | Status: DC | PRN
  Administered 2021-06-09: 40 mg via INTRAVENOUS

## 2021-06-09 MED ORDER — ONDANSETRON HCL 4 MG/2ML IJ SOLN
INTRAMUSCULAR | Status: AC
  Filled 2021-06-09: qty 2

## 2021-06-09 MED ORDER — DEXAMETHASONE SODIUM PHOSPHATE 10 MG/ML IJ SOLN
INTRAMUSCULAR | Status: AC
  Filled 2021-06-09: qty 1

## 2021-06-09 MED ORDER — PROPOFOL 1000 MG/100ML IV EMUL
INTRAVENOUS | Status: AC
  Filled 2021-06-09: qty 200

## 2021-06-09 MED ORDER — ORAL CARE MOUTH RINSE: 15.0000 mL | Freq: Once | OROMUCOSAL | Status: AC

## 2021-06-09 SURGICAL SUPPLY — 24 items
BAG DRN RND TRDRP ANRFLXCHMBR (UROLOGICAL SUPPLIES) ×1
BAG URINE DRAIN 2000ML AR STRL (UROLOGICAL SUPPLIES) ×2 IMPLANT
BNDG GAUZE ELAST 4 BULKY (GAUZE/BANDAGES/DRESSINGS) ×1 IMPLANT
CATH FOLEY 2WAY SLVR  5CC 18FR (CATHETERS) ×2
CATH FOLEY 2WAY SLVR 5CC 18FR (CATHETERS) ×2 IMPLANT
COVER SURGICAL LIGHT HANDLE (MISCELLANEOUS) ×1 IMPLANT
DRAPE INCISE IOBAN 66X45 STRL (DRAPES) ×1 IMPLANT
DRAPE U-SHAPE 47X51 STRL (DRAPES) ×1 IMPLANT
DRSG TEGADERM 4X4.75 (GAUZE/BANDAGES/DRESSINGS) ×1 IMPLANT
DRSG TEGADERM 8X12 (GAUZE/BANDAGES/DRESSINGS) ×1 IMPLANT
GAS ARGON HIGH PRESSURE (MEDICAL GASES) ×2 IMPLANT
GAS HELIUM HIGH PRESSURE (MEDICAL GASES) ×2 IMPLANT
GLOVE SURG ENC TEXT LTX SZ7.5 (GLOVE) ×3 IMPLANT
GOWN STRL REUS W/TWL XL LVL3 (GOWN DISPOSABLE) ×2 IMPLANT
GUIDEWIRE AMPLATZ STIFF 0.35 (WIRE) ×2 IMPLANT
KIT CRYO ENDOCARE (DISPOSABLE) ×1 IMPLANT
KIT TURNOVER KIT A (KITS) ×2 IMPLANT
PACK CYSTO (CUSTOM PROCEDURE TRAY) ×2 IMPLANT
PANTS MESH DISP LRG (UNDERPADS AND DIAPERS) IMPLANT
PANTS MESH DISPOSABLE L (UNDERPADS AND DIAPERS) ×1
PENCIL SMOKE EVACUATOR (MISCELLANEOUS) IMPLANT
PLUG CATH AND CAP STER (CATHETERS) ×2 IMPLANT
SHEET LAVH (DRAPES) ×2 IMPLANT
TOWEL OR 17X26 10 PK STRL BLUE (TOWEL DISPOSABLE) ×2 IMPLANT

## 2021-06-09 NOTE — Transfer of Care (Signed)
Immediate Anesthesia Transfer of Care Note  Patient: Tracy Pratt  Procedure(s) Performed: CRYO ABLATION PROSTATE  Patient Location: PACU  Anesthesia Type:General  Level of Consciousness: awake, alert  and patient cooperative  Airway & Oxygen Therapy: Patient Spontanous Breathing and Patient connected to face mask oxygen  Post-op Assessment: Report given to RN and Post -op Vital signs reviewed and stable  Post vital signs: Reviewed and stable  Last Vitals:  Vitals Value Taken Time  BP 120/67 06/09/21 1002  Temp    Pulse 68 06/09/21 1005  Resp 21 06/09/21 1005  SpO2 100 % 06/09/21 1005  Vitals shown include unvalidated device data.  Last Pain:  Vitals:   06/09/21 0636  TempSrc:   PainSc: 0-No pain      Patients Stated Pain Goal: 3 ([id number])  Complications: No notable events documented.

## 2021-06-09 NOTE — Op Note (Signed)
Pre-operative diagnosis : Adenocarcinoma prostate, T1c  Postoperative diagnosis:  Same  Operation:  1. Cystoscopy 2. Cryotherapy of the prostate  Surgeon: Nicolette Bang, MD  Anesthesia:  General  Drains: 18 French foley catheter  Specimen: None  EBL: Minimal  Findings: 12.5cc prostate gland. No evidence of cryoprobes in the urethra or bladder on cystoscopy. Complete ablation of the prostate with 2 cryoablation probes.  Indications: The patient is a 87yo with a history of recurrent prostate cancer found on repeat biopsy. He is status post brachytherapy for his initial prostate cancer treatment.  After discussing treatment options the patient elected to proceed with cryoablation of the prostate.  He has erectile dysfunction already.  Procedure in detail: Prior to procedure consent was obtained. The patient was brought to the operating room and a brief timeout was done ensuring the correct patient, correct procedure, and correct site.  After appropriate anesthesia, the patient was placed on the operative table in dorsal supine position where general LMA anesthesia was introduced. He was then placed in the dorsal lithotomy position with the pubis was prepped with Betadine solution and draped in usual fashion.    An 42 French Foley catheter is placed with 10 mL in the balloon. The patient then underwent 6 rows of cryotherapy probe insertion, under real time ultrasound control. When all the cryotherapy probes were placed, temperature probes were placed into an denonvilliers fascia, and periurethral tissue. Following this, cystourethroscopy was performed and this revealed no probe within the bladder or urethra. A guidewire was placed in the bladder through the flexible cystoscope prior to removal. Over the guidewire, the urethral warming device was placed, and a cap placed on the end. Following this, with the urethral warming device in place, the patient underwent 2 separate freeze-thaw cycles,  using Argon gas and Helium gas. The second cycle used active freezing, and active thaw process at the beginning, but then passive thaw process to finish. The urethral warming device was left in place for 10 extra minutes, prior to removal, and replacement with an 18 French Foley catheter placed to straight drainage with 10 mL of sterile water in the balloon. The patient received IV Tylenol and IV Toradol. This then concluded the procedure which was well tolerated by the patient. He was awakened and taken to recovery room in good condition.  Condition: Stable, extubated, transferred to PACU  Complications: None  Plan: Patient is to be discharged home. He is to followup in 1 week for foley catheter removal

## 2021-06-09 NOTE — Anesthesia Procedure Notes (Addendum)
Procedure Name: LMA Insertion Date/Time: 06/09/2021 8:21 AM Performed by: Eben Burow, CRNA Pre-anesthesia Checklist: Patient identified, Emergency Drugs available, Suction available, Patient being monitored and Timeout performed Patient Re-evaluated:Patient Re-evaluated prior to induction Oxygen Delivery Method: Circle system utilized Preoxygenation: Pre-oxygenation with 100% oxygen Induction Type: IV induction Ventilation: Mask ventilation without difficulty LMA: LMA inserted LMA Size: 4.0 Number of attempts: 1 Tube secured with: Tape Dental Injury: Teeth and Oropharynx as per pre-operative assessment

## 2021-06-09 NOTE — Anesthesia Procedure Notes (Signed)
Procedure Name: Intubation Date/Time: 06/09/2021 8:43 AM Performed by: Eben Burow, CRNA Pre-anesthesia Checklist: Patient identified, Emergency Drugs available, Suction available, Patient being monitored and Timeout performed Patient Re-evaluated:Patient Re-evaluated prior to induction Oxygen Delivery Method: Circle system utilized Preoxygenation: Pre-oxygenation with 100% oxygen Induction Type: IV induction Laryngoscope Size: Mac and 4 Grade View: Grade I Tube type: Oral Tube size: 7.5 mm Number of attempts: 1 Airway Equipment and Method: Stylet Placement Confirmation: ETT inserted through vocal cords under direct vision, positive ETCO2 and breath sounds checked- equal and bilateral Secured at: 23 cm Tube secured with: Tape Dental Injury: Teeth and Oropharynx as per pre-operative assessment

## 2021-06-09 NOTE — H&P (Signed)
Urology Admission H&P  Chief Complaint: prostate cancer  History of Present Illness: [name] is a 87yo here for cryotherapy for recurrent high grade prostate cancer. He denies nay significant LUTS. No hematuria  Past Medical History:  Diagnosis Date   Adrenal adenoma    Left   Arthritis    BPH (benign prostatic hyperplasia)    Cataract 2007, 2009   Bilateral   Colon polyps    Essential hypertension    Heart murmur    HX OF 30 YEARS AGO   Helicobacter pylori gastritis 09/09/2018   Malignant hyperthermia    Prostate cancer ([place]) 08/08/2007   Seed Implant and XRT   Spondylolisthesis    L5-S1   Past Surgical History:  Procedure Laterality Date   APPENDECTOMY  [place]   BIOPSY  08/25/2018   Procedure: BIOPSY;  Surgeon: Gatha Mayer, MD;  Location: [facility];  Service: Endoscopy;;   CATARACT EXTRACTION  2007   COLONOSCOPY  2012   [facility]: single diminutive hyperplastic polyp in rectum   CORNEAL TRANSPLANT  2007   ESOPHAGOGASTRODUODENOSCOPY (EGD) WITH PROPOFOL N/A 08/25/2018   Empiric dilation of esophagus, erythematous mucosa in antrum s/p biopsy, normal duodenum. Clotest positive.   KNEE SURGERY  1996   left knee arthoscopic   MALONEY DILATION  08/25/2018   Procedure: MALONEY DILATION;  Surgeon: Gatha Mayer, MD;  Location: [facility];  Service: Endoscopy;;   ROTATOR CUFF REPAIR  2015   Torn meniscus left knee  1996   TRANSURETHRAL RESECTION OF PROSTATE  1997    Home Medications:  Current Facility-Administered Medications  Medication Dose Route Frequency Provider Last Rate Last Admin   ceFAZolin (ANCEF) IVPB 2g/100 mL premix  2 g Intravenous 30 min Pre-Op Kerrilynn Derenzo, Candee Furbish, MD       chlorhexidine (HIBICLENS) 4 % liquid   Topical Once Zymere Patlan, Candee Furbish, MD       lactated ringers infusion   Intravenous Continuous Catalina Gravel, MD 10 mL/hr at 06/09/21 [id number] Continued from Pre-op at 06/09/21 0648   sodium phosphate (FLEET) 7-19  GM/118ML enema 1 enema  1 enema Rectal Once Alyson Ingles Candee Furbish, MD       Allergies:  Allergies  Allergen Reactions   Diclofenac Sodium Anaphylaxis   Nabumetone Anaphylaxis   Nsaids Anaphylaxis   Tolmetin Anaphylaxis   Bee Venom Swelling   Desflurane Other (See Comments)    Malignant hyperthermia   Enflurane Other (See Comments)    Malignant hyperthermia   Halothane Other (See Comments)    Malignant hyperthermia   Isoflurane Other (See Comments)    Malignant hyperthermia   Sevoflurane Other (See Comments)    Malignant hyperthermia   Succinylcholine Other (See Comments)    Malignant hyperthermia    Family History  Problem Relation Age of Onset   Prostate cancer Brother    Lung disease Brother        lobectomy   Social History:  reports that he has quit smoking. His smoking use included cigarettes. He has a 15.00 pack-year smoking history. He has never used smokeless tobacco. He reports that he does not drink alcohol and does not use drugs.  Review of Systems  All other systems reviewed and are negative.  Physical Exam:  Vital signs in last 24 hours: Temp:  [97.7 F (36.5 C)] 97.7 F (36.5 C) (08/12 0612) Pulse Rate:  [74] 74 (08/12 0612) Resp:  [17] 17 (08/12 0612) BP: (158)/(75) 158/75 (08/12 0612)  SpO2:  [97 %] 97 % (08/12 0612) Weight:  [98.4 kg] 98.4 kg (08/12 0636) Physical Exam Vitals reviewed.  Constitutional:      Appearance: Normal appearance.  HENT:     Head: Normocephalic and atraumatic.     Mouth/Throat:     Mouth: Mucous membranes are dry.  Eyes:     Extraocular Movements: Extraocular movements intact.     Pupils: Pupils are equal, round, and reactive to light.  Cardiovascular:     Rate and Rhythm: Normal rate and regular rhythm.  Pulmonary:     Effort: Pulmonary effort is normal. No respiratory distress.  Abdominal:     General: Abdomen is flat. There is no distension.  Musculoskeletal:        General: No swelling. Normal range of motion.      Cervical back: Normal range of motion and neck supple.  Skin:    General: Skin is warm and dry.  Neurological:     General: No focal deficit present.     Mental Status: He is alert and oriented to person, place, and time.  Psychiatric:        Mood and Affect: Mood normal.        Behavior: Behavior normal.        Thought Content: Thought content normal.        Judgment: Judgment normal.    Laboratory Data:  No results found for this or any previous visit (from the past 24 hour(s)). No results found for this or any previous visit (from the past 240 hour(s)). Creatinine: Recent Labs    06/05/21 1122  CREATININE 1.19   Baseline Creatinine: 1.2  Impression/Assessment:  87yo with prostate cancer  Plan:  I discussed the natural history of high risk prostate cancer with the patient and the various treatment options including active surveillance, RALP, cryotherapy, HIFU and ADT. After discussing the options the patient elects for cryotherapy. Risks/benefits/alternatives discussed   Nicolette Bang 06/09/2021, 7:37 AM

## 2021-06-09 NOTE — Anesthesia Postprocedure Evaluation (Signed)
Anesthesia Post Note  Patient: Tracy Pratt  Procedure(s) Performed: CRYO ABLATION PROSTATE     Patient location during evaluation: PACU Anesthesia Type: General Level of consciousness: awake and alert Pain management: pain level controlled Vital Signs Assessment: post-procedure vital signs reviewed and stable Respiratory status: spontaneous breathing, nonlabored ventilation, respiratory function stable and patient connected to nasal cannula oxygen Cardiovascular status: blood pressure returned to baseline and stable Postop Assessment: no apparent nausea or vomiting Anesthetic complications: no   No notable events documented.  Last Vitals:  Vitals:   06/09/21 1030 06/09/21 1050  BP: 127/75 123/76  Pulse: 62   Resp: 17 13  Temp: (!) 36.3 C   SpO2: 96%     Last Pain:  Vitals:   06/09/21 1050  TempSrc:   PainSc: 0-No pain                 Effie Berkshire

## 2021-06-13 ENCOUNTER — Encounter (HOSPITAL_COMMUNITY): Payer: Self-pay | Admitting: Urology

## 2021-06-14 DIAGNOSIS — H35363 Drusen (degenerative) of macula, bilateral: Secondary | ICD-10-CM | POA: Diagnosis not present

## 2021-06-14 DIAGNOSIS — Z947 Corneal transplant status: Secondary | ICD-10-CM | POA: Diagnosis not present

## 2021-06-14 DIAGNOSIS — H3561 Retinal hemorrhage, right eye: Secondary | ICD-10-CM | POA: Diagnosis not present

## 2021-06-14 DIAGNOSIS — H43813 Vitreous degeneration, bilateral: Secondary | ICD-10-CM | POA: Diagnosis not present

## 2021-06-14 DIAGNOSIS — H16143 Punctate keratitis, bilateral: Secondary | ICD-10-CM | POA: Diagnosis not present

## 2021-06-14 DIAGNOSIS — H18513 Endothelial corneal dystrophy, bilateral: Secondary | ICD-10-CM | POA: Diagnosis not present

## 2021-06-14 DIAGNOSIS — Z961 Presence of intraocular lens: Secondary | ICD-10-CM | POA: Diagnosis not present

## 2021-06-15 ENCOUNTER — Other Ambulatory Visit: Payer: Self-pay

## 2021-06-15 ENCOUNTER — Ambulatory Visit (INDEPENDENT_AMBULATORY_CARE_PROVIDER_SITE_OTHER): Payer: Medicare Other

## 2021-06-15 DIAGNOSIS — C61 Malignant neoplasm of prostate: Secondary | ICD-10-CM

## 2021-06-15 NOTE — Progress Notes (Signed)
Fill and Pull Catheter Removal  Patient is present today for a catheter removal.  Patient was cleaned and prepped in a sterile fashion 256m of sterile water/ saline was instilled into the bladder when the patient felt the urge to urinate. 167mof water was then drained from the balloon.  A 18FR foley cath was removed from the bladder no complications were noted .  Patient as then given some time to void on their own.  Patient can void  20060mn their own after some time.  Patient tolerated well.  Performed by: [place]on Yearby LPN  Follow up/ Additional notes: Keep scheduled post op

## 2021-06-19 DIAGNOSIS — Z85828 Personal history of other malignant neoplasm of skin: Secondary | ICD-10-CM | POA: Diagnosis not present

## 2021-06-19 DIAGNOSIS — Z08 Encounter for follow-up examination after completed treatment for malignant neoplasm: Secondary | ICD-10-CM | POA: Diagnosis not present

## 2021-06-21 ENCOUNTER — Other Ambulatory Visit: Payer: Self-pay

## 2021-06-21 ENCOUNTER — Encounter: Payer: Self-pay | Admitting: Urology

## 2021-06-21 ENCOUNTER — Ambulatory Visit (INDEPENDENT_AMBULATORY_CARE_PROVIDER_SITE_OTHER): Payer: Medicare Other | Admitting: Urology

## 2021-06-21 VITALS — BP 122/68 | HR 73

## 2021-06-21 DIAGNOSIS — N400 Enlarged prostate without lower urinary tract symptoms: Secondary | ICD-10-CM

## 2021-06-21 DIAGNOSIS — C61 Malignant neoplasm of prostate: Secondary | ICD-10-CM

## 2021-06-21 DIAGNOSIS — N3941 Urge incontinence: Secondary | ICD-10-CM | POA: Diagnosis not present

## 2021-06-21 MED ORDER — MIRABEGRON ER 25 MG PO TB24: 25.0000 mg | ORAL_TABLET | Freq: Every day | ORAL | 0 refills | Status: DC

## 2021-06-21 NOTE — Patient Instructions (Signed)

## 2021-06-21 NOTE — Progress Notes (Signed)
06/21/2021 11:25 AM   Tracy Pratt 18-Jul-1931 [id number]  Referring provider: [name], MD [address] [place],  [place] [address]  Followup prostate cancer   HPI: Tracy Pratt is a 87yo here for followup after cryotherapy of the prostate. He denies any pain. He does have worsening urgency and urge incontinence since cryotherapy. He uses 5-6 pads per day. Urine stream strong. NO other complaints today   PMH: Past Medical History:  Diagnosis Date   Adrenal adenoma    Left   Arthritis    BPH (benign prostatic hyperplasia)    Cataract 2007, 2009   Bilateral   Colon polyps    Essential hypertension    Heart murmur    HX OF 30 YEARS AGO   Helicobacter pylori gastritis 09/09/2018   Malignant hyperthermia    Prostate cancer ([place]) 08/08/2007   Seed Implant and XRT   Spondylolisthesis    L5-S1    Surgical History: Past Surgical History:  Procedure Laterality Date   [place]   BIOPSY  08/25/2018   Procedure: BIOPSY;  Surgeon: Gatha Mayer, MD;  [facility];  Service: Endoscopy;;   CATARACT EXTRACTION  2007   COLONOSCOPY  2012   [facility]: single diminutive hyperplastic polyp in rectum   CORNEAL TRANSPLANT  2007   CRYOABLATION N/A 06/09/2021   Procedure: CRYO ABLATION PROSTATE;  Surgeon: Cleon Gustin, MD;  Location: [facility];  Service: Urology;  Laterality: N/A;   ESOPHAGOGASTRODUODENOSCOPY (EGD) WITH PROPOFOL N/A 08/25/2018   Empiric dilation of esophagus, erythematous mucosa in antrum s/p biopsy, normal duodenum. Clotest positive.   KNEE SURGERY  1996   left knee arthoscopic   MALONEY DILATION  08/25/2018   Procedure: MALONEY DILATION;  Surgeon: Gatha Mayer, MD;  [facility];  Service: Endoscopy;;   ROTATOR CUFF REPAIR  2015   Torn meniscus left knee  1996   TRANSURETHRAL RESECTION OF PROSTATE  1997    Home Medications:  Allergies as of 06/21/2021       Reactions   Diclofenac Sodium  Anaphylaxis   Nabumetone Anaphylaxis   Nsaids Anaphylaxis   Tolmetin Anaphylaxis   Bee Venom Swelling   Desflurane Other (See Comments)   Malignant hyperthermia   Enflurane Other (See Comments)   Malignant hyperthermia   Halothane Other (See Comments)   Malignant hyperthermia   Isoflurane Other (See Comments)   Malignant hyperthermia   Sevoflurane Other (See Comments)   Malignant hyperthermia   Succinylcholine Other (See Comments)   Malignant hyperthermia        Medication List        Accurate as of June 21, 2021 11:25 AM. If you have any questions, ask your nurse or doctor.          HYDROcodone-acetaminophen 7.5-325 MG tablet Commonly known as: NORCO Take 1 tablet by mouth every 6 (six) hours as needed for moderate pain.   levocetirizine 5 MG tablet Commonly known as: XYZAL Take 5 mg by mouth daily.   lisinopril-hydrochlorothiazide 10-12.5 MG tablet Commonly known as: ZESTORETIC Take 1 tablet by mouth daily.   loteprednol 0.5 % ophthalmic suspension Commonly known as: LOTEMAX Place 1 drop into the right eye at bedtime.   methocarbamol 500 MG tablet Commonly known as: ROBAXIN Take 1 tablet (500 mg total) by mouth 3 (three) times daily.   mirabegron ER 25 MG Tb24 tablet Commonly known as: MYRBETRIQ Take 1 tablet (25 mg total) by mouth  daily. Started by: Nicolette Bang, MD   prednisoLONE acetate 1 % ophthalmic suspension Commonly known as: PRED FORTE Place 1 drop into the left eye 3 (three) times daily.   valACYclovir 1000 MG tablet Commonly known as: VALTREX Take 1,000 mg by mouth daily.        Allergies:  Allergies  Allergen Reactions   Diclofenac Sodium Anaphylaxis   Nabumetone Anaphylaxis   Nsaids Anaphylaxis   Tolmetin Anaphylaxis   Bee Venom Swelling   Desflurane Other (See Comments)    Malignant hyperthermia   Enflurane Other (See Comments)    Malignant hyperthermia   Halothane Other (See Comments)    Malignant hyperthermia    Isoflurane Other (See Comments)    Malignant hyperthermia   Sevoflurane Other (See Comments)    Malignant hyperthermia   Succinylcholine Other (See Comments)    Malignant hyperthermia    Family History: Family History  Problem Relation Age of Onset   Prostate cancer Brother    Lung disease Brother        lobectomy    Social History:  reports that he has quit smoking. His smoking use included cigarettes. He has a 15.00 pack-year smoking history. He has never used smokeless tobacco. He reports that he does not drink alcohol and does not use drugs.  ROS: All other review of systems were reviewed and are negative except what is noted above in HPI  Physical Exam: BP 122/68   Pulse 73   Constitutional:  Alert and oriented, No acute distress. HEENT: [place] AT, moist mucus membranes.  Trachea midline, no masses. Cardiovascular: No clubbing, cyanosis, or edema. Respiratory: Normal respiratory effort, no increased work of breathing. GI: Abdomen is soft, nontender, nondistended, no abdominal masses GU: No CVA tenderness.  Lymph: No cervical or inguinal lymphadenopathy. Skin: No rashes, bruises or suspicious lesions. Neurologic: Grossly intact, no focal deficits, moving all 4 extremities. Psychiatric: Normal mood and affect.  Laboratory Data: Lab Results  Component Value Date   WBC 7.7 06/05/2021   HGB 16.9 06/05/2021   HCT 49.5 06/05/2021   MCV 95.7 06/05/2021   PLT 199 06/05/2021    Lab Results  Component Value Date   CREATININE 1.19 06/05/2021    No results found for: PSA  No results found for: TESTOSTERONE  Lab Results  Component Value Date   HGBA1C 5.8 07/02/2017    Urinalysis    Component Value Date/Time   COLORURINE YELLOW 09/25/2014 1525   APPEARANCEUR Clear 03/06/2021 1157   LABSPEC 1.004 (L) 09/25/2014 1525   PHURINE 5.5 09/25/2014 1525   GLUCOSEU Negative 03/06/2021 1157   HGBUR LARGE (A) 09/25/2014 1525   BILIRUBINUR Negative 03/06/2021 1157    KETONESUR NEGATIVE 09/25/2014 1525   PROTEINUR Negative 03/06/2021 1157   PROTEINUR NEGATIVE 09/25/2014 1525   UROBILINOGEN 0.2 09/25/2014 1525   NITRITE Negative 03/06/2021 1157   NITRITE NEGATIVE 09/25/2014 1525   LEUKOCYTESUR Negative 03/06/2021 1157    Lab Results  Component Value Date   LABMICR Comment 03/06/2021   WBCUA >30 (A) 01/20/2021   LABEPIT None seen 01/20/2021   BACTERIA Few (A) 01/20/2021    Pertinent Imaging:  No results found for this or any previous visit.  No results found for this or any previous visit.  No results found for this or any previous visit.  No results found for this or any previous visit.  No results found for this or any previous visit.  No results found for this or any previous visit.  No results  found for this or any previous visit.  No results found for this or any previous visit.   Assessment & Plan:    1. Prostate cancer ([place]) -RTC 3 months with PSA - Urinalysis, Routine w reflex microscopic  2. Benign prostatic hyperplasia, unspecified whether lower urinary tract symptoms present -Mirabegron '25mg'$  daily  3. Urge incontinence -We will trial mirabegron '25mg'$  daily. RCT 4-6 weeks with PVR   No follow-ups on file.  Nicolette Bang, MD  [facility] [place]

## 2021-06-21 NOTE — Progress Notes (Signed)
Urological Symptom Review  Patient is experiencing the following symptoms: Hard to postpone urination Leakage of urine   Review of Systems  Gastrointestinal (upper)  : Negative for upper GI symptoms  Gastrointestinal (lower) : Negative for lower GI symptoms  Constitutional : Negative for symptoms  Skin: Negative for skin symptoms  Eyes: Negative for eye symptoms  Ear/Nose/Throat : Negative for Ear/Nose/Throat symptoms  Hematologic/Lymphatic: Negative for Hematologic/Lymphatic symptoms  Cardiovascular : Negative for cardiovascular symptoms  Respiratory : Negative for respiratory symptoms  Endocrine: Negative for endocrine symptoms  Musculoskeletal: Negative for musculoskeletal symptoms  Neurological: Negative for neurological symptoms  Psychologic: Negative for psychiatric symptoms

## 2021-06-26 ENCOUNTER — Encounter: Payer: Self-pay | Admitting: Urology

## 2021-07-21 ENCOUNTER — Encounter: Payer: Self-pay | Admitting: Urology

## 2021-07-21 ENCOUNTER — Ambulatory Visit (INDEPENDENT_AMBULATORY_CARE_PROVIDER_SITE_OTHER): Payer: Medicare Other | Admitting: Urology

## 2021-07-21 ENCOUNTER — Other Ambulatory Visit: Payer: Self-pay

## 2021-07-21 VITALS — BP 145/76 | HR 64 | Temp 98.1°F | Ht 69.5 in | Wt 221.2 lb

## 2021-07-21 DIAGNOSIS — N390 Urinary tract infection, site not specified: Secondary | ICD-10-CM

## 2021-07-21 DIAGNOSIS — N3941 Urge incontinence: Secondary | ICD-10-CM | POA: Diagnosis not present

## 2021-07-21 LAB — BLADDER SCAN AMB NON-IMAGING: Scan Result: 58

## 2021-07-21 MED ORDER — TROSPIUM CHLORIDE ER 60 MG PO CP24: 1.0000 | ORAL_CAPSULE | Freq: Every day | ORAL | 11 refills | Status: DC

## 2021-07-21 NOTE — Progress Notes (Signed)
Urological Symptom Review  Patient is experiencing the following symptoms: Hard to postpone urination Get up at night to urinate Leakage of urine   Review of Systems  Gastrointestinal (upper)  : Negative for upper GI symptoms  Gastrointestinal (lower) : Negative for lower GI symptoms  Constitutional : Negative for symptoms  Skin: Negative for skin symptoms  Eyes: Negative for eye symptoms  Ear/Nose/Throat : Negative for Ear/Nose/Throat symptoms  Hematologic/Lymphatic: Negative for Hematologic/Lymphatic symptoms  Cardiovascular : Leg swelling  Respiratory : Negative for respiratory symptoms  Endocrine: Negative for endocrine symptoms  Musculoskeletal: Negative for musculoskeletal symptoms  Neurological: Negative for neurological symptoms  Psychologic: Negative for psychiatric symptoms

## 2021-07-21 NOTE — Progress Notes (Signed)
post void residual=58 

## 2021-07-21 NOTE — Progress Notes (Signed)
07/21/2021 10:50 AM   Tracy Pratt 1930/11/21 [id number]  Referring provider: [name], MD [address] [place],  [place] [address]  Followup urge incontinence   HPI: Tracy Pratt is a 87yo here for followup for urge incontinence. Since cryotherapy he has noted worsening urgency and urge incontinence. He tried mirabegron 25mg  which failed to improve his incontinence. He has nocturia 2x. He has an intermittent weak urinary stream. He has mild SUI. NO hx of glaucoma. No other complaints today   PMH: Past Medical History:  Diagnosis Date   Adrenal adenoma    Left   Arthritis    BPH (benign prostatic hyperplasia)    Cataract 2007, 2009   Bilateral   Colon polyps    Essential hypertension    Heart murmur    HX OF 30 YEARS AGO   Helicobacter pylori gastritis 09/09/2018   Malignant hyperthermia    Prostate cancer ([place]) 08/08/2007   Seed Implant and XRT   Spondylolisthesis    L5-S1    Surgical History: Past Surgical History:  Procedure Laterality Date   [place]   BIOPSY  08/25/2018   Procedure: BIOPSY;  Surgeon: Gatha Mayer, MD;  Location: [facility];  Service: Endoscopy;;   CATARACT EXTRACTION  2007   COLONOSCOPY  2012   [facility]: single diminutive hyperplastic polyp in rectum   CORNEAL TRANSPLANT  2007   CRYOABLATION N/A 06/09/2021   Procedure: CRYO ABLATION PROSTATE;  Surgeon: Cleon Gustin, MD;  Location: [facility];  Service: Urology;  Laterality: N/A;   ESOPHAGOGASTRODUODENOSCOPY (EGD) WITH PROPOFOL N/A 08/25/2018   Empiric dilation of esophagus, erythematous mucosa in antrum s/p biopsy, normal duodenum. Clotest positive.   KNEE SURGERY  1996   left knee arthoscopic   MALONEY DILATION  08/25/2018   Procedure: MALONEY DILATION;  Surgeon: Gatha Mayer, MD;  [facility]: [facility];  Service: Endoscopy;;   ROTATOR CUFF REPAIR  2015   Torn meniscus left knee  1996   TRANSURETHRAL RESECTION OF PROSTATE  1997     Home Medications:  Allergies as of 07/21/2021       Reactions   Diclofenac Sodium Anaphylaxis   Nabumetone Anaphylaxis   Nsaids Anaphylaxis   Tolmetin Anaphylaxis   Bee Venom Swelling   Desflurane Other (See Comments)   Malignant hyperthermia   Enflurane Other (See Comments)   Malignant hyperthermia   Halothane Other (See Comments)   Malignant hyperthermia   Isoflurane Other (See Comments)   Malignant hyperthermia   Sevoflurane Other (See Comments)   Malignant hyperthermia   Succinylcholine Other (See Comments)   Malignant hyperthermia        Medication List        Accurate as of July 21, 2021 10:50 AM. If you have any questions, ask your nurse or doctor.          HYDROcodone-acetaminophen 7.5-325 MG tablet Commonly known as: NORCO Take 1 tablet by mouth every 6 (six) hours as needed for moderate pain.   levocetirizine 5 MG tablet Commonly known as: XYZAL Take 5 mg by mouth daily.   lisinopril-hydrochlorothiazide 10-12.5 MG tablet Commonly known as: ZESTORETIC Take 1 tablet by mouth daily.   loteprednol 0.5 % ophthalmic suspension Commonly known as: LOTEMAX Place 1 drop into the right eye at bedtime.   methocarbamol 500 MG tablet Commonly known as: ROBAXIN Take 1 tablet (500 mg total) by mouth 3 (three) times daily.   mirabegron ER 25 MG  Tb24 tablet Commonly known as: MYRBETRIQ Take 1 tablet (25 mg total) by mouth daily.   prednisoLONE acetate 1 % ophthalmic suspension Commonly known as: PRED FORTE Place 1 drop into the left eye 3 (three) times daily.   valACYclovir 1000 MG tablet Commonly known as: VALTREX Take 1,000 mg by mouth daily.        Allergies:  Allergies  Allergen Reactions   Diclofenac Sodium Anaphylaxis   Nabumetone Anaphylaxis   Nsaids Anaphylaxis   Tolmetin Anaphylaxis   Bee Venom Swelling   Desflurane Other (See Comments)    Malignant hyperthermia   Enflurane Other (See Comments)    Malignant hyperthermia    Halothane Other (See Comments)    Malignant hyperthermia   Isoflurane Other (See Comments)    Malignant hyperthermia   Sevoflurane Other (See Comments)    Malignant hyperthermia   Succinylcholine Other (See Comments)    Malignant hyperthermia    Family History: Family History  Problem Relation Age of Onset   Prostate cancer Brother    Lung disease Brother        lobectomy    Social History:  reports that he has quit smoking. His smoking use included cigarettes. He has a 15.00 pack-year smoking history. He has never used smokeless tobacco. He reports that he does not drink alcohol and does not use drugs.  ROS: All other review of systems were reviewed and are negative except what is noted above in HPI  Physical Exam: BP (!) 145/76   Pulse 64   Temp 98.1 F (36.7 C)   Ht 5' 9.5" (1.765 m)   Wt 221 lb 3.2 oz (100.3 kg)   BMI 32.20 kg/m   Constitutional:  Alert and oriented, No acute distress. HEENT: [place] AT, moist mucus membranes.  Trachea midline, no masses. Cardiovascular: No clubbing, cyanosis, or edema. Respiratory: Normal respiratory effort, no increased work of breathing. GI: Abdomen is soft, nontender, nondistended, no abdominal masses GU: No CVA tenderness.  Lymph: No cervical or inguinal lymphadenopathy. Skin: No rashes, bruises or suspicious lesions. Neurologic: Grossly intact, no focal deficits, moving all 4 extremities. Psychiatric: Normal mood and affect.  Laboratory Data: Lab Results  Component Value Date   WBC 7.7 06/05/2021   HGB 16.9 06/05/2021   HCT 49.5 06/05/2021   MCV 95.7 06/05/2021   PLT 199 06/05/2021    Lab Results  Component Value Date   CREATININE 1.19 06/05/2021    No results found for: PSA  No results found for: TESTOSTERONE  Lab Results  Component Value Date   HGBA1C 5.8 07/02/2017    Urinalysis    Component Value Date/Time   COLORURINE YELLOW 09/25/2014 1525   APPEARANCEUR Clear 03/06/2021 1157   LABSPEC 1.004 (L)  09/25/2014 1525   PHURINE 5.5 09/25/2014 1525   GLUCOSEU Negative 03/06/2021 1157   HGBUR LARGE (A) 09/25/2014 1525   BILIRUBINUR Negative 03/06/2021 1157   KETONESUR NEGATIVE 09/25/2014 1525   PROTEINUR Negative 03/06/2021 1157   PROTEINUR NEGATIVE 09/25/2014 1525   UROBILINOGEN 0.2 09/25/2014 1525   NITRITE Negative 03/06/2021 1157   NITRITE NEGATIVE 09/25/2014 1525   LEUKOCYTESUR Negative 03/06/2021 1157    Lab Results  Component Value Date   LABMICR Comment 03/06/2021   WBCUA >30 (A) 01/20/2021   LABEPIT None seen 01/20/2021   BACTERIA Few (A) 01/20/2021    Pertinent Imaging:  No results found for this or any previous visit.  No results found for this or any previous visit.  No results found for  this or any previous visit.  No results found for this or any previous visit.  No results found for this or any previous visit.  No results found for this or any previous visit.  No results found for this or any previous visit.  No results found for this or any previous visit.   Assessment & Plan:    1. Urge incontinence -We will start sanctura 60mg  daily - BLADDER SCAN AMB NON-IMAGING - Urinalysis, Routine w reflex microscopic     No follow-ups on file.  Nicolette Bang, MD  [facility] [place]

## 2021-07-21 NOTE — Patient Instructions (Signed)

## 2021-08-21 ENCOUNTER — Other Ambulatory Visit: Payer: Self-pay

## 2021-08-21 ENCOUNTER — Encounter: Payer: Self-pay | Admitting: Urology

## 2021-08-21 ENCOUNTER — Ambulatory Visit (INDEPENDENT_AMBULATORY_CARE_PROVIDER_SITE_OTHER): Payer: Medicare Other | Admitting: Urology

## 2021-08-21 VITALS — BP 123/69 | HR 90 | Temp 98.5°F | Wt 223.4 lb

## 2021-08-21 DIAGNOSIS — N3941 Urge incontinence: Secondary | ICD-10-CM

## 2021-08-21 DIAGNOSIS — C61 Malignant neoplasm of prostate: Secondary | ICD-10-CM

## 2021-08-21 NOTE — Progress Notes (Signed)
Urological Symptom Review  Patient is experiencing the following symptoms: Get up at night to urinate Leakage of urine Stream starts and stops   Review of Systems  Gastrointestinal (upper)  : Negative for upper GI symptoms  Gastrointestinal (lower) : Negative for lower GI symptoms  Constitutional : Negative for symptoms  Skin: Negative for skin symptoms  Eyes: Negative for eye symptoms  Ear/Nose/Throat : Negative for Ear/Nose/Throat symptoms  Hematologic/Lymphatic: Easy bruising  Cardiovascular : Leg swelling  Respiratory : Negative for respiratory symptoms  Endocrine: Negative for endocrine symptoms  Musculoskeletal: Back pain Joint pain  Neurological: Negative for neurological symptoms  Psychologic: Negative for psychiatric symptoms

## 2021-08-21 NOTE — Progress Notes (Signed)
Online referral made to [facility] for [facility] Penile Clamp.

## 2021-08-21 NOTE — Progress Notes (Signed)
08/21/2021 11:13 AM   Tracy Pratt 1931-07-19 [id number]  Referring provider: [name], MD [address] [place],  [place] [address]  Followup urinary incontinence   HPI: Tracy Pratt is a 87yo here for followup for urinary incontinence. Last visit he was started on sanctura which failed to improve his incontinence. He has constant urinary incontinence which he is unaware. No dysuria or hematuria. He rarely gets the urge to urinate. Urine stream is good when he gets a fuller bladder. He uses 6-7 pads per day which are soaked. He uses 1-2 pads at night.    PMH: Past Medical History:  Diagnosis Date   Adrenal adenoma    Left   Arthritis    BPH (benign prostatic hyperplasia)    Cataract 2007, 2009   Bilateral   Colon polyps    Essential hypertension    Heart murmur    HX OF 30 YEARS AGO   Helicobacter pylori gastritis 09/09/2018   Malignant hyperthermia    Prostate cancer ([place]) 08/08/2007   Seed Implant and XRT   Spondylolisthesis    L5-S1    Surgical History: Past Surgical History:  Procedure Laterality Date   [place]   BIOPSY  08/25/2018   Procedure: BIOPSY;  Surgeon: Gatha Mayer, MD;  [facility];  Service: Endoscopy;;   CATARACT EXTRACTION  2007   COLONOSCOPY  2012   [facility]: single diminutive hyperplastic polyp in rectum   CORNEAL TRANSPLANT  2007   CRYOABLATION N/A 06/09/2021   Procedure: CRYO ABLATION PROSTATE;  Surgeon: Cleon Gustin, MD;  Location: [facility];  Service: Urology;  Laterality: N/A;   ESOPHAGOGASTRODUODENOSCOPY (EGD) WITH PROPOFOL N/A 08/25/2018   Empiric dilation of esophagus, erythematous mucosa in antrum s/p biopsy, normal duodenum. Clotest positive.   KNEE SURGERY  1996   left knee arthoscopic   MALONEY DILATION  08/25/2018   Procedure: MALONEY DILATION;  Surgeon: Gatha Mayer, MD;  [facility];  Service: Endoscopy;;   ROTATOR CUFF REPAIR  2015   Torn meniscus  left knee  1996   TRANSURETHRAL RESECTION OF PROSTATE  1997    Home Medications:  Allergies as of 08/21/2021       Reactions   Diclofenac Sodium Anaphylaxis   Nabumetone Anaphylaxis   Nsaids Anaphylaxis   Tolmetin Anaphylaxis   Bee Venom Swelling   Desflurane Other (See Comments)   Malignant hyperthermia   Enflurane Other (See Comments)   Malignant hyperthermia   Halothane Other (See Comments)   Malignant hyperthermia   Isoflurane Other (See Comments)   Malignant hyperthermia   Sevoflurane Other (See Comments)   Malignant hyperthermia   Succinylcholine Other (See Comments)   Malignant hyperthermia        Medication List        Accurate as of August 21, 2021 11:13 AM. If you have any questions, ask your nurse or doctor.          HYDROcodone-acetaminophen 7.5-325 MG tablet Commonly known as: NORCO Take 1 tablet by mouth every 6 (six) hours as needed for moderate pain.   levocetirizine 5 MG tablet Commonly known as: XYZAL Take 5 mg by mouth daily.   lisinopril-hydrochlorothiazide 10-12.5 MG tablet Commonly known as: ZESTORETIC Take 1 tablet by mouth daily.   loteprednol 0.5 % ophthalmic suspension Commonly known as: LOTEMAX Place 1 drop into the right eye at bedtime.   methocarbamol 500 MG tablet Commonly known as: ROBAXIN Take 1  tablet (500 mg total) by mouth 3 (three) times daily.   mirabegron ER 25 MG Tb24 tablet Commonly known as: MYRBETRIQ Take 1 tablet (25 mg total) by mouth daily.   prednisoLONE acetate 1 % ophthalmic suspension Commonly known as: PRED FORTE Place 1 drop into the left eye 3 (three) times daily.   Trospium Chloride 60 MG Cp24 Take 1 capsule (60 mg total) by mouth daily.   valACYclovir 1000 MG tablet Commonly known as: VALTREX Take 1,000 mg by mouth daily.        Allergies:  Allergies  Allergen Reactions   Diclofenac Sodium Anaphylaxis   Nabumetone Anaphylaxis   Nsaids Anaphylaxis   Tolmetin Anaphylaxis   Bee  Venom Swelling   Desflurane Other (See Comments)    Malignant hyperthermia   Enflurane Other (See Comments)    Malignant hyperthermia   Halothane Other (See Comments)    Malignant hyperthermia   Isoflurane Other (See Comments)    Malignant hyperthermia   Sevoflurane Other (See Comments)    Malignant hyperthermia   Succinylcholine Other (See Comments)    Malignant hyperthermia    Family History: Family History  Problem Relation Age of Onset   Prostate cancer Brother    Lung disease Brother        lobectomy    Social History:  reports that he has quit smoking. His smoking use included cigarettes. He has a 15.00 pack-year smoking history. He has never used smokeless tobacco. He reports that he does not drink alcohol and does not use drugs.  ROS: All other review of systems were reviewed and are negative except what is noted above in HPI  Physical Exam: BP 123/69   Pulse 90   Temp 98.5 F (36.9 C)   Wt 223 lb 6.4 oz (101.3 kg)   BMI 32.52 kg/m   Constitutional:  Alert and oriented, No acute distress. HEENT: [place] AT, moist mucus membranes.  Trachea midline, no masses. Cardiovascular: No clubbing, cyanosis, or edema. Respiratory: Normal respiratory effort, no increased work of breathing. GI: Abdomen is soft, nontender, nondistended, no abdominal masses GU: No CVA tenderness.  Lymph: No cervical or inguinal lymphadenopathy. Skin: No rashes, bruises or suspicious lesions. Neurologic: Grossly intact, no focal deficits, moving all 4 extremities. Psychiatric: Normal mood and affect.  Laboratory Data: Lab Results  Component Value Date   WBC 7.7 06/05/2021   HGB 16.9 06/05/2021   HCT 49.5 06/05/2021   MCV 95.7 06/05/2021   PLT 199 06/05/2021    Lab Results  Component Value Date   CREATININE 1.19 06/05/2021    No results found for: PSA  No results found for: TESTOSTERONE  Lab Results  Component Value Date   HGBA1C 5.8 07/02/2017    Urinalysis    Component  Value Date/Time   COLORURINE YELLOW 09/25/2014 1525   APPEARANCEUR Clear 03/06/2021 1157   LABSPEC 1.004 (L) 09/25/2014 1525   PHURINE 5.5 09/25/2014 1525   GLUCOSEU Negative 03/06/2021 1157   HGBUR LARGE (A) 09/25/2014 1525   BILIRUBINUR Negative 03/06/2021 1157   KETONESUR NEGATIVE 09/25/2014 1525   PROTEINUR Negative 03/06/2021 1157   PROTEINUR NEGATIVE 09/25/2014 1525   UROBILINOGEN 0.2 09/25/2014 1525   NITRITE Negative 03/06/2021 1157   NITRITE NEGATIVE 09/25/2014 1525   LEUKOCYTESUR Negative 03/06/2021 1157    Lab Results  Component Value Date   LABMICR Comment 03/06/2021   WBCUA >30 (A) 01/20/2021   LABEPIT None seen 01/20/2021   BACTERIA Few (A) 01/20/2021    Pertinent Imaging:  No results found for this or any previous visit.  No results found for this or any previous visit.  No results found for this or any previous visit.  No results found for this or any previous visit.  No results found for this or any previous visit.  No results found for this or any previous visit.  No results found for this or any previous visit.  No results found for this or any previous visit.   Assessment & Plan:    1. Urge incontinence -We will trial a penile clamp - Urinalysis, Routine w reflex microscopic - BLADDER SCAN AMB NON-IMAGING  2. Prostate cancer ([place]) -RCT 8 weeks with PSA   No follow-ups on file.  Nicolette Bang, MD  [facility] [place]

## 2021-08-21 NOTE — Progress Notes (Signed)
post void residual = 0 ml

## 2021-08-21 NOTE — Patient Instructions (Signed)
Prostate Cancer °The prostate is a small gland that helps make semen. It is located below a man's bladder, in front of the rectum. Prostate cancer is when abnormal cells grow in this gland. °What are the causes? °The cause of this condition is not known. °What increases the risk? °Being age 85 or older. °Having a family history of prostate cancer. °Having a family history of cancer of the breasts or ovaries. °Having genes that are passed from parent to child (inherited). °Having Lynch syndrome. °African American men and men of African descent are diagnosed with prostate cancer at higher rates than other men. °What are the signs or symptoms? °Problems peeing (urinating). This may include: °A stream that is weak, or pee that stops and starts. °Trouble starting or stopping your pee. °Trouble emptying all of your pee. °Needing to pee more often, especially at night. °Blood in your pee or semen. °Pain in the: °Lower back. °Lower belly (abdomen). °Hips. °Trouble getting an erection. °Weakness or numbness in the legs or feet. °How is this treated? °Treatment for this condition depends on: °How much the cancer has spread. °Your age. °The kind of treatment you want. °Your health. °Treatments include: °Being watched. This is called observation. You will be tested from time to time, but you will not get treated. Tests are to make sure that the cancer is not growing. °Surgery. This may be done to: °Take out (remove) the prostate. °Freeze and kill cancer cells. °Radiation. This uses a strong beam of energy to kill cancer cells. °Chemotherapy. This uses medicines that stop cancer cells from increasing. This kills cancer cells and healthy cells. °Targeted therapy. This kills cancer cells only. Healthy cells are not affected. °Hormone treatment. This stops the body from making hormones that help the cancer cells grow. °Follow these instructions at home: °Lifestyle °Do not smoke or use any products that contain nicotine or tobacco.  If you need help quitting, ask your doctor. °Eat a healthy diet. °Treatment may affect your ability to have sex. If you have a partner, touch, hold, hug, and caress your partner to have intimate moments. °Get plenty of sleep. °Ask your doctor for help to find a support group for men with prostate cancer. °General instructions °Take over-the-counter and prescription medicines only as told by your doctor. °If you have to go to the hospital, let your cancer doctor (oncologist) know. °Keep all follow-up visits. °Where to find more information °American Cancer Society: www.cancer.org °American Society of Clinical Oncology: www.cancer.net °National Cancer Institute: www.cancer.gov °Contact a doctor if: °You have new or more trouble peeing. °You have new or more blood in your pee. °You have new or more pain in your hips, back, or chest. °Get help right away if: °You have weakness in your legs. °You lose feeling in your legs. °You cannot control your pee or your poop (stool). °You have chills or a fever. °Summary °The prostate is a male gland that helps make semen. °Prostate cancer is when abnormal cells grow in this gland. °Treatment includes doing surgery, using medicines, using strong beams of energy, or watching without treatment. °Ask your doctor for help to find a support group for men with prostate cancer. °Contact a doctor if you have problems peeing or have any new pain that you did not have before. °This information is not intended to replace advice given to you by your health care provider. Make sure you discuss any questions you have with your health care provider. °Document Revised: 01/11/2021 Document Reviewed: 01/11/2021 °Elsevier   Patient Education © 2022 [employer] ° °

## 2021-09-04 DIAGNOSIS — X32XXXD Exposure to sunlight, subsequent encounter: Secondary | ICD-10-CM | POA: Diagnosis not present

## 2021-09-04 DIAGNOSIS — D044 Carcinoma in situ of skin of scalp and neck: Secondary | ICD-10-CM | POA: Diagnosis not present

## 2021-09-04 DIAGNOSIS — L570 Actinic keratosis: Secondary | ICD-10-CM | POA: Diagnosis not present

## 2021-10-18 ENCOUNTER — Other Ambulatory Visit: Payer: Self-pay

## 2021-10-18 ENCOUNTER — Encounter: Payer: Self-pay | Admitting: Urology

## 2021-10-18 ENCOUNTER — Ambulatory Visit (INDEPENDENT_AMBULATORY_CARE_PROVIDER_SITE_OTHER): Payer: Medicare Other | Admitting: Urology

## 2021-10-18 VITALS — BP 141/84 | HR 75

## 2021-10-18 DIAGNOSIS — N3941 Urge incontinence: Secondary | ICD-10-CM | POA: Diagnosis not present

## 2021-10-18 DIAGNOSIS — C61 Malignant neoplasm of prostate: Secondary | ICD-10-CM | POA: Diagnosis not present

## 2021-10-18 DIAGNOSIS — N390 Urinary tract infection, site not specified: Secondary | ICD-10-CM | POA: Diagnosis not present

## 2021-10-18 DIAGNOSIS — N400 Enlarged prostate without lower urinary tract symptoms: Secondary | ICD-10-CM

## 2021-10-18 LAB — URINALYSIS, ROUTINE W REFLEX MICROSCOPIC
Bilirubin, UA: NEGATIVE
Glucose, UA: NEGATIVE
Ketones, UA: NEGATIVE
Nitrite, UA: NEGATIVE
Protein,UA: NEGATIVE
Specific Gravity, UA: 1.015 (ref 1.005–1.030)
Urobilinogen, Ur: 0.2 mg/dL (ref 0.2–1.0)
pH, UA: 5.5 (ref 5.0–7.5)

## 2021-10-18 LAB — MICROSCOPIC EXAMINATION: Renal Epithel, UA: NONE SEEN /hpf

## 2021-10-18 NOTE — Progress Notes (Signed)
10/18/2021 11:42 AM   Tracy Pratt 1931/01/01 [id number]  Referring provider: [name], MD [address] [place],  [place] [address]  Followup urinary incontinence and prostate cancer   HPI: Tracy Pratt is a 87yo here for followup for urinary incontinence and prostate cancer. No recent PSA. He continues to have constant urinary incontinence. He uses 7 diapers per day and 2-3 diapers at night. His incontinence worsened after cryotherapy. He tried the penile clamp and can keep it on for 60-90 minutes then removes it due to pain. He is very unhappy with his incontinence.    PMH: Past Medical History:  Diagnosis Date   Adrenal adenoma    Left   Arthritis    BPH (benign prostatic hyperplasia)    Cataract 2007, 2009   Bilateral   Colon polyps    Essential hypertension    Heart murmur    HX OF 30 YEARS AGO   Helicobacter pylori gastritis 09/09/2018   Malignant hyperthermia    Prostate cancer ([place]) 08/08/2007   Seed Implant and XRT   Spondylolisthesis    L5-S1    Surgical History: Past Surgical History:  Procedure Laterality Date   [place]   BIOPSY  08/25/2018   Procedure: BIOPSY;  Surgeon: Gatha Mayer, MD;  Location: [facility];  Service: Endoscopy;;   CATARACT EXTRACTION  2007   COLONOSCOPY  2012   [facility]: single diminutive hyperplastic polyp in rectum   CORNEAL TRANSPLANT  2007   CRYOABLATION N/A 06/09/2021   Procedure: CRYO ABLATION PROSTATE;  Surgeon: Cleon Gustin, MD;  Location: [facility];  Service: Urology;  Laterality: N/A;   ESOPHAGOGASTRODUODENOSCOPY (EGD) WITH PROPOFOL N/A 08/25/2018   Empiric dilation of esophagus, erythematous mucosa in antrum s/p biopsy, normal duodenum. Clotest positive.   KNEE SURGERY  1996   left knee arthoscopic   MALONEY DILATION  08/25/2018   Procedure: MALONEY DILATION;  Surgeon: Gatha Mayer, MD;  [facility]: [facility];  Service: Endoscopy;;   ROTATOR CUFF REPAIR  2015    Torn meniscus left knee  1996   TRANSURETHRAL RESECTION OF PROSTATE  1997    Home Medications:  Allergies as of 10/18/2021       Reactions   Diclofenac Sodium Anaphylaxis   Nabumetone Anaphylaxis   Nsaids Anaphylaxis   Tolmetin Anaphylaxis   Bee Venom Swelling   Desflurane Other (See Comments)   Malignant hyperthermia   Enflurane Other (See Comments)   Malignant hyperthermia   Halothane Other (See Comments)   Malignant hyperthermia   Isoflurane Other (See Comments)   Malignant hyperthermia   Sevoflurane Other (See Comments)   Malignant hyperthermia   Succinylcholine Other (See Comments)   Malignant hyperthermia        Medication List        Accurate as of October 18, 2021 11:42 AM. If you have any questions, ask your nurse or doctor.          HYDROcodone-acetaminophen 7.5-325 MG tablet Commonly known as: NORCO Take 1 tablet by mouth every 6 (six) hours as needed for moderate pain.   levocetirizine 5 MG tablet Commonly known as: XYZAL Take 5 mg by mouth daily.   lisinopril-hydrochlorothiazide 10-12.5 MG tablet Commonly known as: ZESTORETIC Take 1 tablet by mouth daily.   loteprednol 0.5 % ophthalmic suspension Commonly known as: LOTEMAX Place 1 drop into the right eye at bedtime.   methocarbamol 500 MG tablet Commonly known as: ROBAXIN Take 1  tablet (500 mg total) by mouth 3 (three) times daily.   mirabegron ER 25 MG Tb24 tablet Commonly known as: MYRBETRIQ Take 1 tablet (25 mg total) by mouth daily.   prednisoLONE acetate 1 % ophthalmic suspension Commonly known as: PRED FORTE Place 1 drop into the left eye 3 (three) times daily.   Trospium Chloride 60 MG Cp24 Take 1 capsule (60 mg total) by mouth daily.   valACYclovir 1000 MG tablet Commonly known as: VALTREX Take 1,000 mg by mouth daily.        Allergies:  Allergies  Allergen Reactions   Diclofenac Sodium Anaphylaxis   Nabumetone Anaphylaxis   Nsaids Anaphylaxis   Tolmetin  Anaphylaxis   Bee Venom Swelling   Desflurane Other (See Comments)    Malignant hyperthermia   Enflurane Other (See Comments)    Malignant hyperthermia   Halothane Other (See Comments)    Malignant hyperthermia   Isoflurane Other (See Comments)    Malignant hyperthermia   Sevoflurane Other (See Comments)    Malignant hyperthermia   Succinylcholine Other (See Comments)    Malignant hyperthermia    Family History: Family History  Problem Relation Age of Onset   Prostate cancer Brother    Lung disease Brother        lobectomy    Social History:  reports that he has quit smoking. His smoking use included cigarettes. He has a 15.00 pack-year smoking history. He has never used smokeless tobacco. He reports that he does not drink alcohol and does not use drugs.  ROS: All other review of systems were reviewed and are negative except what is noted above in HPI  Physical Exam: BP (!) 141/84    Pulse 75   Constitutional:  Alert and oriented, No acute distress. HEENT: [place] AT, moist mucus membranes.  Trachea midline, no masses. Cardiovascular: No clubbing, cyanosis, or edema. Respiratory: Normal respiratory effort, no increased work of breathing. GI: Abdomen is soft, nontender, nondistended, no abdominal masses GU: No CVA tenderness.  Lymph: No cervical or inguinal lymphadenopathy. Skin: No rashes, bruises or suspicious lesions. Neurologic: Grossly intact, no focal deficits, moving all 4 extremities. Psychiatric: Normal mood and affect.  Laboratory Data: Lab Results  Component Value Date   WBC 7.7 06/05/2021   HGB 16.9 06/05/2021   HCT 49.5 06/05/2021   MCV 95.7 06/05/2021   PLT 199 06/05/2021    Lab Results  Component Value Date   CREATININE 1.19 06/05/2021    No results found for: PSA  No results found for: TESTOSTERONE  Lab Results  Component Value Date   HGBA1C 5.8 07/02/2017    Urinalysis    Component Value Date/Time   COLORURINE YELLOW 09/25/2014 1525    APPEARANCEUR Clear 03/06/2021 1157   LABSPEC 1.004 (L) 09/25/2014 1525   PHURINE 5.5 09/25/2014 1525   GLUCOSEU Negative 03/06/2021 1157   HGBUR LARGE (A) 09/25/2014 1525   BILIRUBINUR Negative 03/06/2021 1157   KETONESUR NEGATIVE 09/25/2014 1525   PROTEINUR Negative 03/06/2021 1157   PROTEINUR NEGATIVE 09/25/2014 1525   UROBILINOGEN 0.2 09/25/2014 1525   NITRITE Negative 03/06/2021 1157   NITRITE NEGATIVE 09/25/2014 1525   LEUKOCYTESUR Negative 03/06/2021 1157    Lab Results  Component Value Date   LABMICR Comment 03/06/2021   WBCUA >30 (A) 01/20/2021   LABEPIT None seen 01/20/2021   BACTERIA Few (A) 01/20/2021    Pertinent Imaging:  No results found for this or any previous visit.  No results found for this or any previous visit.  No results found for this or any previous visit.  No results found for this or any previous visit.  No results found for this or any previous visit.  No results found for this or any previous visit.  No results found for this or any previous visit.  No results found for this or any previous visit.   Assessment & Plan:    1. Urge incontinence/constant urinary incontinence -Referral to Dr. Matilde Sprang  - Urinalysis, Routine w reflex microscopic  2. Prostate cancer ([place]) - PSA today, will call with results.   No follow-ups on file.  Nicolette Bang, MD  [facility] [place]

## 2021-10-18 NOTE — Patient Instructions (Signed)
Urinary Incontinence °Urinary incontinence refers to a condition in which a person is unable to control where and when to pass urine. A person with this condition will urinate involuntarily. This means that the person urinates when he or she does not mean to. °What are the causes? °This condition may be caused by: °Medicines. °Infections. °Constipation. °Overactive bladder muscles. °Weak bladder muscles. °Weak pelvic floor muscles. These muscles provide support for the bladder, intestine, and, in women, the uterus. °Enlarged prostate in men. The prostate is a gland near the bladder. When it gets too big, it can pinch the urethra. With the urethra blocked, the bladder can weaken and lose the ability to empty properly. °Surgery. °Emotional factors, such as anxiety, stress, or post-traumatic stress disorder (PTSD). °Spinal cord injury, nerve injury, or other neurological conditions. °Pelvic organ prolapse. This happens in women when organs move out of place and into the vagina. This movement can prevent the bladder and urethra from working properly. °What increases the risk? °The following factors may make you more likely to develop this condition: °Age. The older you are, the higher the risk. °Obesity. °Being physically inactive. °Pregnancy and childbirth. °Menopause. °Diseases that affect the nerves or spinal cord. °Long-term, or chronic, coughing. This can increase pressure on the bladder and pelvic floor muscles. °What are the signs or symptoms? °Symptoms may vary depending on the type of urinary incontinence you have. They include: °A sudden urge to urinate, and passing urine involuntarily before you can get to a bathroom (urge incontinence). °Suddenly passing urine when doing activities that force urine to pass, such as coughing, laughing, exercising, or sneezing (stress incontinence). °Needing to urinate often but urinating only a small amount, or constantly dribbling urine (overflow incontinence). °Urinating  because you cannot get to the bathroom in time due to a physical disability, such as arthritis or injury, or due to a communication or thinking problem, such as Alzheimer's disease (functional incontinence). °How is this diagnosed? °This condition may be diagnosed based on: °Your medical history. °A physical exam. °Tests, such as: °Urine tests. °X-rays of your kidney and bladder. °Ultrasound. °CT scan. °Cystoscopy. In this procedure, a health care provider inserts a tube with a light and camera (cystoscope) through the urethra and into the bladder to check for problems. °Urodynamic testing. These tests assess how well the bladder, urethra, and sphincter can store and release urine. There are different types of urodynamic tests, and they vary depending on what the test is measuring. °To help diagnose your condition, your health care provider may recommend that you keep a log of when you urinate and how much you urinate. °How is this treated? °Treatment for this condition depends on the type of incontinence that you have and its cause. Treatment may include: °Lifestyle changes, such as: °Quitting smoking. °Maintaining a healthy weight. °Staying active. Try to get 150 minutes of moderate-intensity exercise every week. Ask your health care provider which activities are safe for you. °Eating a healthy diet. °Avoid high-fat foods, like fried foods. °Avoid refined carbohydrates like white bread and white rice. °Limit how much alcohol and caffeine you drink. °Increase your fiber intake. Healthy sources of fiber include beans, whole grains, and fresh fruits and vegetables. °Behavioral changes, such as: °Pelvic floor muscle exercises. °Bladder training, such as lengthening the amount of time between bathroom breaks, or using the bathroom at regular intervals. °Using techniques to suppress bladder urges. This can include distraction techniques or controlled breathing exercises. °Medicines, such as: °Medicines to relax the  bladder   muscles and prevent bladder spasms. °Medicines to help slow or prevent the growth of a man's prostate. °Botox injections. These can help relax the bladder muscles. °Treatments, such as: °Using pulses of electricity to help change bladder reflexes (electrical nerve stimulation). °For women, using a medical device to prevent urine leaks. This is a small, tampon-like, disposable device that is inserted into the urethra. °Injecting collagen or carbon beads (bulking agents) into the urinary sphincter. These can help thicken tissue and close the bladder opening. °Surgery. °Follow these instructions at home: °Lifestyle °Limit alcohol and caffeine. These can fill your bladder quickly and irritate it. °Keep yourself clean to help prevent odors and skin damage. Ask your health care provider about special skin creams and cleansers that can protect the skin from urine. °Consider wearing pads or adult diapers. Make sure to change them regularly, and always change them right after experiencing incontinence. °General instructions °Take over-the-counter and prescription medicines only as told by your health care provider. °Use the bathroom about every 3-4 hours, even if you do not feel the need to urinate. Try to empty your bladder completely every time. After urinating, wait a minute. Then try to urinate again. °Make sure you are in a relaxed position while urinating. °If your incontinence is caused by nerve problems, keep a log of the medicines you take and the times you go to the bathroom. °Keep all follow-up visits. This is important. °Where to find more information °National Institute of Diabetes and Digestive and Kidney Diseases: www.niddk.nih.gov °American Urology Association: www.urologyhealth.org °Contact a health care provider if: °You have pain that gets worse. °Your incontinence gets worse. °Get help right away if: °You have a fever or chills. °You are unable to urinate. °You have redness in your groin area or  down your legs. °Summary °Urinary incontinence refers to a condition in which a person is unable to control where and when to pass urine. °This condition may be caused by medicines, infection, weak bladder muscles, weak pelvic floor muscles, enlargement of the prostate (in men), or surgery. °Factors such as older age, obesity, pregnancy and childbirth, menopause, neurological diseases, and chronic coughing may increase your risk for developing this condition. °Types of urinary incontinence include urge incontinence, stress incontinence, overflow incontinence, and functional incontinence. °This condition is usually treated first with lifestyle and behavioral changes, such as quitting smoking, eating a healthier diet, and doing regular pelvic floor exercises. Other treatment options include medicines, bulking agents, medical devices, electrical nerve stimulation, or surgery. °This information is not intended to replace advice given to you by your health care provider. Make sure you discuss any questions you have with your health care provider. °Document Revised: 05/20/2020 Document Reviewed: 05/20/2020 °Elsevier Patient Education © 2022 Elsevier Inc. ° °

## 2021-10-18 NOTE — Progress Notes (Signed)
Urological Symptom Review  Patient is experiencing the following symptoms: Leakage of urine Stream starts and stops   Review of Systems  Gastrointestinal (upper)  : Negative for upper GI symptoms  Gastrointestinal (lower) : Negative for lower GI symptoms  Constitutional : Negative for symptoms  Skin: Negative for skin symptoms  Eyes: Negative for eye symptoms  Ear/Nose/Throat : Negative for Ear/Nose/Throat symptoms  Hematologic/Lymphatic: Easy bruising  Cardiovascular : Leg swelling  Respiratory : Negative for respiratory symptoms  Endocrine: Negative for endocrine symptoms  Musculoskeletal: Back pain  Neurological: Negative for neurological symptoms  Psychologic: Negative for psychiatric symptoms

## 2021-10-19 ENCOUNTER — Other Ambulatory Visit: Payer: Medicare Other

## 2021-10-19 DIAGNOSIS — C61 Malignant neoplasm of prostate: Secondary | ICD-10-CM | POA: Diagnosis not present

## 2021-10-20 LAB — PSA: Prostate Specific Ag, Serum: 2.8 ng/mL (ref 0.0–4.0)

## 2021-10-25 DIAGNOSIS — Z947 Corneal transplant status: Secondary | ICD-10-CM | POA: Diagnosis not present

## 2021-10-25 DIAGNOSIS — T868412 Corneal transplant failure, left eye: Secondary | ICD-10-CM | POA: Diagnosis not present

## 2021-11-06 DIAGNOSIS — L820 Inflamed seborrheic keratosis: Secondary | ICD-10-CM | POA: Diagnosis not present

## 2021-11-06 DIAGNOSIS — X32XXXD Exposure to sunlight, subsequent encounter: Secondary | ICD-10-CM | POA: Diagnosis not present

## 2021-11-06 DIAGNOSIS — Z85828 Personal history of other malignant neoplasm of skin: Secondary | ICD-10-CM | POA: Diagnosis not present

## 2021-11-06 DIAGNOSIS — L570 Actinic keratosis: Secondary | ICD-10-CM | POA: Diagnosis not present

## 2021-11-06 DIAGNOSIS — Z08 Encounter for follow-up examination after completed treatment for malignant neoplasm: Secondary | ICD-10-CM | POA: Diagnosis not present

## 2021-11-21 DIAGNOSIS — R351 Nocturia: Secondary | ICD-10-CM | POA: Diagnosis not present

## 2021-11-21 DIAGNOSIS — N3942 Incontinence without sensory awareness: Secondary | ICD-10-CM | POA: Diagnosis not present

## 2021-12-25 DIAGNOSIS — N3942 Incontinence without sensory awareness: Secondary | ICD-10-CM | POA: Diagnosis not present

## 2022-01-11 ENCOUNTER — Telehealth: Payer: Self-pay | Admitting: Internal Medicine

## 2022-01-11 DIAGNOSIS — N3941 Urge incontinence: Secondary | ICD-10-CM | POA: Diagnosis not present

## 2022-01-11 NOTE — Telephone Encounter (Signed)
Recall for mri °

## 2022-01-11 NOTE — Telephone Encounter (Signed)
Letter mailed

## 2022-01-15 ENCOUNTER — Other Ambulatory Visit: Payer: Medicare Other

## 2022-01-15 ENCOUNTER — Other Ambulatory Visit: Payer: Self-pay

## 2022-01-15 DIAGNOSIS — C61 Malignant neoplasm of prostate: Secondary | ICD-10-CM | POA: Diagnosis not present

## 2022-01-16 ENCOUNTER — Encounter: Payer: Self-pay | Admitting: Orthopaedic Surgery

## 2022-01-16 ENCOUNTER — Ambulatory Visit (INDEPENDENT_AMBULATORY_CARE_PROVIDER_SITE_OTHER): Payer: Medicare Other | Admitting: Orthopaedic Surgery

## 2022-01-16 ENCOUNTER — Ambulatory Visit: Payer: Medicare Other

## 2022-01-16 ENCOUNTER — Other Ambulatory Visit: Payer: Self-pay | Admitting: Orthopaedic Surgery

## 2022-01-16 VITALS — BP 141/74 | HR 89 | Ht 69.5 in | Wt 222.8 lb

## 2022-01-16 DIAGNOSIS — G8929 Other chronic pain: Secondary | ICD-10-CM

## 2022-01-16 DIAGNOSIS — M25561 Pain in right knee: Secondary | ICD-10-CM

## 2022-01-16 LAB — PSA: Prostate Specific Ag, Serum: 3.3 ng/mL (ref 0.0–4.0)

## 2022-01-16 NOTE — Progress Notes (Signed)
My right knee is hurting  He has been having pain and popping of the right knee.  He has feeling of giving way but it has not.  He has no recent trauma.  It is getting gradually worse.  Right knee has crepitus, effusion, ROM 0 to 105, medial joint pain, weakly positive medial Mc[place], limp right, no distal edema.  He had arthroscopy in the past on this knee.  X-rays were done of the right knee, reported separately.  Encounter Diagnosis  Name Primary?   Chronic pain of right knee Yes   PROCEDURE NOTE:  The patient requests injections of the right knee , verbal consent was obtained.  The right knee was prepped appropriately after time out was performed.   Sterile technique was observed and injection of 1 cc of DepoMedrol '40mg'$  with several cc's of plain xylocaine. Anesthesia was provided by ethyl chloride and a 20-gauge needle was used to inject the knee area. The injection was tolerated well.  A band aid dressing was applied.  The patient was advised to apply ice later today and tomorrow to the injection sight as needed.  I will see him as needed.  Call if any problem.  Precautions discussed.  Electronically Signed Sanjuana Kava, MD 3/21/20231:42 PM

## 2022-01-17 ENCOUNTER — Telehealth: Payer: Self-pay

## 2022-01-17 NOTE — Telephone Encounter (Signed)
Patient thought that you were going to send in something for pain for him yesterday  ? ? ?PATIENT USES [facility] ?

## 2022-01-18 MED ORDER — HYDROCODONE-ACETAMINOPHEN 7.5-325 MG PO TABS: ORAL_TABLET | ORAL | 0 refills | Status: DC

## 2022-01-18 NOTE — Telephone Encounter (Signed)
Patient aware prescription has been done. ?

## 2022-01-22 ENCOUNTER — Other Ambulatory Visit: Payer: Self-pay

## 2022-01-22 ENCOUNTER — Encounter: Payer: Self-pay | Admitting: Urology

## 2022-01-22 ENCOUNTER — Ambulatory Visit (INDEPENDENT_AMBULATORY_CARE_PROVIDER_SITE_OTHER): Payer: Medicare Other | Admitting: Urology

## 2022-01-22 VITALS — BP 128/75 | HR 67

## 2022-01-22 DIAGNOSIS — N400 Enlarged prostate without lower urinary tract symptoms: Secondary | ICD-10-CM

## 2022-01-22 DIAGNOSIS — C61 Malignant neoplasm of prostate: Secondary | ICD-10-CM

## 2022-01-22 DIAGNOSIS — N3941 Urge incontinence: Secondary | ICD-10-CM | POA: Diagnosis not present

## 2022-01-22 LAB — URINALYSIS, ROUTINE W REFLEX MICROSCOPIC
Bilirubin, UA: NEGATIVE
Glucose, UA: NEGATIVE
Ketones, UA: NEGATIVE
Nitrite, UA: NEGATIVE
Protein,UA: NEGATIVE
Specific Gravity, UA: 1.02 (ref 1.005–1.030)
Urobilinogen, Ur: 0.2 mg/dL (ref 0.2–1.0)
pH, UA: 6 (ref 5.0–7.5)

## 2022-01-22 LAB — MICROSCOPIC EXAMINATION
Bacteria, UA: NONE SEEN
Renal Epithel, UA: NONE SEEN /hpf
WBC, UA: >30 /hpf — AB (ref 0–5)

## 2022-01-22 NOTE — Progress Notes (Signed)
? ?01/22/2022 ?2:03 PM  ? ?Wesson Stith ?02/09/1931 ?[id number] ? ?Referring provider: [name], MD ?[address] ?[place],   [address] ? ?Followup urinary incontinence and prostate cancer ? ? ?HPI: ?[name] is a 88yo here for followup for urinary incontinence and prostate cancer. PSA increased to 3.3 from 2.8. No bone pain. He saw Dr. Matilde Sprang for urinary incontinence following cryotherapy and he is currently on Gemtesa for the past week. He uses 6-7 diapers daily and 2-3 pads per night. Nocturia 1-2x. He is using a penile clamp for his incontinence. No other complaints today ? ? ?PMH: ?Past Medical History:  ?Diagnosis Date  ? Adrenal adenoma   ? Left  ? Arthritis   ? BPH (benign prostatic hyperplasia)   ? Cataract 2007, 2009  ? Bilateral  ? Colon polyps   ? Essential hypertension   ? Heart murmur   ? HX OF 30 YEARS AGO  ? Helicobacter pylori gastritis 09/09/2018  ? Malignant hyperthermia   ? Prostate cancer ([place]) 08/08/2007  ? Seed Implant and XRT  ? Spondylolisthesis   ? L5-S1  ? ? ?Surgical History: ?Past Surgical History:  ?Procedure Laterality Date  ? APPENDECTOMY  1940  ? [place]  ? BIOPSY  08/25/2018  ? Procedure: BIOPSY;  Surgeon: Gatha Mayer, MD;  Location: [facility];  Service: Endoscopy;;  ? CATARACT EXTRACTION  2007  ? COLONOSCOPY  2012  ? [facility]: single diminutive hyperplastic polyp in rectum  ? CORNEAL TRANSPLANT  2007  ? CRYOABLATION N/A 06/09/2021  ? Procedure: CRYO ABLATION PROSTATE;  Surgeon: Cleon Gustin, MD;  Location: [facility];  Service: Urology;  Laterality: N/A;  ? ESOPHAGOGASTRODUODENOSCOPY (EGD) WITH PROPOFOL N/A 08/25/2018  ? Empiric dilation of esophagus, erythematous mucosa in antrum s/p biopsy, normal duodenum. Clotest positive.  ? KNEE SURGERY  1996  ? left knee arthoscopic  ? MALONEY DILATION  08/25/2018  ? Procedure: MALONEY DILATION;  Surgeon: Gatha Mayer, MD;  Location: [facility];  Service: Endoscopy;;  ? ROTATOR CUFF REPAIR   2015  ? Torn meniscus left knee  1996  ? TRANSURETHRAL RESECTION OF PROSTATE  1997  ? ? ?Home Medications:  ?Allergies as of 01/22/2022   ? ?   Reactions  ? Diclofenac Sodium Anaphylaxis  ? Nabumetone Anaphylaxis  ? Nsaids Anaphylaxis  ? Tolmetin Anaphylaxis  ? Bee Venom Swelling  ? Desflurane Other (See Comments)  ? Malignant hyperthermia  ? Enflurane Other (See Comments)  ? Malignant hyperthermia  ? Halothane Other (See Comments)  ? Malignant hyperthermia  ? Isoflurane Other (See Comments)  ? Malignant hyperthermia  ? Sevoflurane Other (See Comments)  ? Malignant hyperthermia  ? Succinylcholine Other (See Comments)  ? Malignant hyperthermia  ? ?  ? ?  ?Medication List  ?  ? ?  ? Accurate as of January 22, 2022  2:03 PM. If you have any questions, ask your nurse or doctor.  ?  ?  ? ?  ? ?Gemtesa 75 MG Tabs ?Generic drug: Vibegron ?  ?HYDROcodone-acetaminophen 7.5-325 MG tablet ?Commonly known as: NORCO ?One tablet every four hours as needed for pain.  Five day supply for acute pain per [facility]. ?  ?levocetirizine 5 MG tablet ?Commonly known as: XYZAL ?Take 5 mg by mouth daily. ?  ?lisinopril-hydrochlorothiazide 10-12.5 MG tablet ?Commonly known as: ZESTORETIC ?Take 1 tablet by mouth daily. ?  ?loteprednol 0.5 % ophthalmic suspension ?Commonly known as: LOTEMAX ?Place 1 drop into the  right eye at bedtime. ?  ?methocarbamol 500 MG tablet ?Commonly known as: ROBAXIN ?Take 1 tablet (500 mg total) by mouth 3 (three) times daily. ?  ?prednisoLONE acetate 1 % ophthalmic suspension ?Commonly known as: PRED FORTE ?Place 1 drop into the left eye 3 (three) times daily. ?  ? ?  ? ? ?Allergies:  ?Allergies  ?Allergen Reactions  ? Diclofenac Sodium Anaphylaxis  ? Nabumetone Anaphylaxis  ? Nsaids Anaphylaxis  ? Tolmetin Anaphylaxis  ? Bee Venom Swelling  ? Desflurane Other (See Comments)  ?  Malignant hyperthermia  ? Enflurane Other (See Comments)  ?  Malignant hyperthermia  ? Halothane Other (See Comments)  ?  Malignant  hyperthermia  ? Isoflurane Other (See Comments)  ?  Malignant hyperthermia  ? Sevoflurane Other (See Comments)  ?  Malignant hyperthermia  ? Succinylcholine Other (See Comments)  ?  Malignant hyperthermia  ? ? ?Family History: ?Family History  ?Problem Relation Age of Onset  ? Prostate cancer Brother   ? Lung disease Brother   ?     lobectomy  ? ? ?Social History:  reports that he has quit smoking. His smoking use included cigarettes. He has a 15.00 pack-year smoking history. He has never used smokeless tobacco. He reports that he does not drink alcohol and does not use drugs. ? ?ROS: ?All other review of systems were reviewed and are negative except what is noted above in HPI ? ?Physical Exam: ?BP 128/75   Pulse 67   ?Constitutional:  Alert and oriented, No acute distress. ?HEENT: [place] AT, moist mucus membranes.  Trachea midline, no masses. ?Cardiovascular: No clubbing, cyanosis, or edema. ?Respiratory: Normal respiratory effort, no increased work of breathing. ?GI: Abdomen is soft, nontender, nondistended, no abdominal masses ?GU: No CVA tenderness.  ?Lymph: No cervical or inguinal lymphadenopathy. ?Skin: No rashes, bruises or suspicious lesions. ?Neurologic: Grossly intact, no focal deficits, moving all 4 extremities. ?Psychiatric: Normal mood and affect. ? ?Laboratory Data: ?Lab Results  ?Component Value Date  ? WBC 7.7 06/05/2021  ? HGB 16.9 06/05/2021  ? HCT 49.5 06/05/2021  ? MCV 95.7 06/05/2021  ? PLT 199 06/05/2021  ? ? ?Lab Results  ?Component Value Date  ? CREATININE 1.19 06/05/2021  ? ? ?No results found for: PSA ? ?No results found for: TESTOSTERONE ? ?Lab Results  ?Component Value Date  ? HGBA1C 5.8 07/02/2017  ? ? ?Urinalysis ?   ?Component Value Date/Time  ? [place] YELLOW 09/25/2014 1525  ? APPEARANCEUR Clear 10/18/2021 1339  ? LABSPEC 1.004 (L) 09/25/2014 1525  ? PHURINE 5.5 09/25/2014 1525  ? GLUCOSEU Negative 10/18/2021 1339  ? HGBUR LARGE (A) 09/25/2014 1525  ? BILIRUBINUR Negative 10/18/2021  1339  ? [place] NEGATIVE 09/25/2014 1525  ? PROTEINUR Negative 10/18/2021 1339  ? PROTEINUR NEGATIVE 09/25/2014 1525  ? UROBILINOGEN 0.2 09/25/2014 1525  ? NITRITE Negative 10/18/2021 1339  ? NITRITE NEGATIVE 09/25/2014 1525  ? LEUKOCYTESUR 1+ (A) 10/18/2021 1339  ? ? ?Lab Results  ?Component Value Date  ? LABMICR See below: 10/18/2021  ? WBCUA 11-30 (A) 10/18/2021  ? LABEPIT 0-10 10/18/2021  ? BACTERIA Few 10/18/2021  ? ? ?Pertinent Imaging: ? ?No results found for this or any previous visit. ? ?No results found for this or any previous visit. ? ?No results found for this or any previous visit. ? ?No results found for this or any previous visit. ? ?No results found for this or any previous visit. ? ?No results found for this or any previous  visit. ? ?No results found for this or any previous visit. ? ?No results found for this or any previous visit. ? ? ?Assessment & Plan:   ? ?1. Urge incontinence ?-patient to continue Gemtesa ?- Urinalysis, Routine w reflex microscopic ? ?2. Benign prostatic hyperplasia, unspecified whether lower urinary tract symptoms present ?-Continue Gemtesa '75mg'$  ?- Urinalysis, Routine w reflex microscopic ? ?3. Prostate cancer ([place]) ?-PSMA PET ? ? ?No follow-ups on file. ? ?Nicolette Bang, MD ? ?[facility] [place] ?  ?

## 2022-01-22 NOTE — Patient Instructions (Signed)
Prostate Cancer °The prostate is a small gland that helps make semen. It is located below a man's bladder, in front of the rectum. Prostate cancer is when abnormal cells grow in this gland. °What are the causes? °The cause of this condition is not known. °What increases the risk? °Being age 86 or older. °Having a family history of prostate cancer. °Having a family history of cancer of the breasts or ovaries. °Having genes that are passed from parent to child (inherited). °Having Lynch syndrome. °African American men and men of African descent are diagnosed with prostate cancer at higher rates than other men. °What are the signs or symptoms? °Problems peeing (urinating). This may include: °A stream that is weak, or pee that stops and starts. °Trouble starting or stopping your pee. °Trouble emptying all of your pee. °Needing to pee more often, especially at night. °Blood in your pee or semen. °Pain in the: °Lower back. °Lower belly (abdomen). °Hips. °Trouble getting an erection. °Weakness or numbness in the legs or feet. °How is this treated? °Treatment for this condition depends on: °How much the cancer has spread. °Your age. °The kind of treatment you want. °Your health. °Treatments include: °Being watched. This is called observation. You will be tested from time to time, but you will not get treated. Tests are to make sure that the cancer is not growing. °Surgery. This may be done to: °Take out (remove) the prostate. °Freeze and kill cancer cells. °Radiation. This uses a strong beam of energy to kill cancer cells. °Chemotherapy. This uses medicines that stop cancer cells from increasing. This kills cancer cells and healthy cells. °Targeted therapy. This kills cancer cells only. Healthy cells are not affected. °Hormone treatment. This stops the body from making hormones that help the cancer cells grow. °Follow these instructions at home: °Lifestyle °Do not smoke or use any products that contain nicotine or tobacco.  If you need help quitting, ask your doctor. °Eat a healthy diet. °Treatment may affect your ability to have sex. If you have a partner, touch, hold, hug, and caress your partner to have intimate moments. °Get plenty of sleep. °Ask your doctor for help to find a support group for men with prostate cancer. °General instructions °Take over-the-counter and prescription medicines only as told by your doctor. °If you have to go to the hospital, let your cancer doctor (oncologist) know. °Keep all follow-up visits. °Where to find more information °American Cancer Society: www.cancer.org °American Society of Clinical Oncology: www.cancer.net °National Cancer Institute: www.cancer.gov °Contact a doctor if: °You have new or more trouble peeing. °You have new or more blood in your pee. °You have new or more pain in your hips, back, or chest. °Get help right away if: °You have weakness in your legs. °You lose feeling in your legs. °You cannot control your pee or your poop (stool). °You have chills or a fever. °Summary °The prostate is a male gland that helps make semen. °Prostate cancer is when abnormal cells grow in this gland. °Treatment includes doing surgery, using medicines, using strong beams of energy, or watching without treatment. °Ask your doctor for help to find a support group for men with prostate cancer. °Contact a doctor if you have problems peeing or have any new pain that you did not have before. °This information is not intended to replace advice given to you by your health care provider. Make sure you discuss any questions you have with your health care provider. °Document Revised: 01/11/2021 Document Reviewed: 01/11/2021 °Elsevier   Patient Education © 2022 [employer] Inc. ° °

## 2022-02-08 ENCOUNTER — Ambulatory Visit (HOSPITAL_COMMUNITY): Payer: Medicare Other

## 2022-02-08 DIAGNOSIS — U071 COVID-19: Secondary | ICD-10-CM | POA: Diagnosis not present

## 2022-02-15 ENCOUNTER — Encounter (HOSPITAL_COMMUNITY)
Admission: RE | Admit: 2022-02-15 | Discharge: 2022-02-15 | Disposition: A | Discharge status: Home / Self Care | Payer: Medicare Other | Source: Ambulatory Visit | Attending: Urology | Admitting: Urology

## 2022-02-15 DIAGNOSIS — C61 Malignant neoplasm of prostate: Secondary | ICD-10-CM | POA: Insufficient documentation

## 2022-02-15 DIAGNOSIS — I700 Atherosclerosis of aorta: Secondary | ICD-10-CM | POA: Diagnosis not present

## 2022-02-15 MED ORDER — PIFLIFOLASTAT F 18 (PYLARIFY) INJECTION
9.0000 | Freq: Once | INTRAVENOUS | Status: AC
  Administered 2022-02-15: 8.11 via INTRAVENOUS

## 2022-02-21 DIAGNOSIS — R350 Frequency of micturition: Secondary | ICD-10-CM | POA: Diagnosis not present

## 2022-02-21 DIAGNOSIS — N3942 Incontinence without sensory awareness: Secondary | ICD-10-CM | POA: Diagnosis not present

## 2022-02-22 DIAGNOSIS — Z20822 Contact with and (suspected) exposure to covid-19: Secondary | ICD-10-CM | POA: Diagnosis not present

## 2022-03-16 DIAGNOSIS — N3941 Urge incontinence: Secondary | ICD-10-CM | POA: Diagnosis not present

## 2022-03-16 DIAGNOSIS — R350 Frequency of micturition: Secondary | ICD-10-CM | POA: Diagnosis not present

## 2022-03-21 ENCOUNTER — Other Ambulatory Visit: Payer: Self-pay | Admitting: Orthopaedic Surgery

## 2022-03-23 DIAGNOSIS — N3941 Urge incontinence: Secondary | ICD-10-CM | POA: Diagnosis not present

## 2022-03-30 DIAGNOSIS — N3941 Urge incontinence: Secondary | ICD-10-CM | POA: Diagnosis not present

## 2022-04-06 DIAGNOSIS — N3941 Urge incontinence: Secondary | ICD-10-CM | POA: Diagnosis not present

## 2022-04-20 DIAGNOSIS — R350 Frequency of micturition: Secondary | ICD-10-CM | POA: Diagnosis not present

## 2022-04-20 DIAGNOSIS — N3941 Urge incontinence: Secondary | ICD-10-CM | POA: Diagnosis not present

## 2022-04-27 DIAGNOSIS — N3941 Urge incontinence: Secondary | ICD-10-CM | POA: Diagnosis not present

## 2022-04-27 DIAGNOSIS — R350 Frequency of micturition: Secondary | ICD-10-CM | POA: Diagnosis not present

## 2022-05-04 DIAGNOSIS — N3941 Urge incontinence: Secondary | ICD-10-CM | POA: Diagnosis not present

## 2022-05-07 DIAGNOSIS — L570 Actinic keratosis: Secondary | ICD-10-CM | POA: Diagnosis not present

## 2022-05-07 DIAGNOSIS — X32XXXD Exposure to sunlight, subsequent encounter: Secondary | ICD-10-CM | POA: Diagnosis not present

## 2022-05-11 DIAGNOSIS — N3941 Urge incontinence: Secondary | ICD-10-CM | POA: Diagnosis not present

## 2022-05-13 ENCOUNTER — Encounter (HOSPITAL_COMMUNITY): Payer: Self-pay

## 2022-05-13 ENCOUNTER — Other Ambulatory Visit: Payer: Self-pay

## 2022-05-13 ENCOUNTER — Emergency Department (HOSPITAL_COMMUNITY): Payer: Medicare Other

## 2022-05-13 ENCOUNTER — Observation Stay (HOSPITAL_COMMUNITY)
Admission: EM | Admit: 2022-05-13 | Discharge: 2022-05-14 | Disposition: A | Discharge status: Home / Self Care | Payer: Medicare Other | Attending: Internal Medicine | Admitting: Internal Medicine

## 2022-05-13 DIAGNOSIS — I10 Essential (primary) hypertension: Secondary | ICD-10-CM | POA: Diagnosis not present

## 2022-05-13 DIAGNOSIS — I110 Hypertensive heart disease with heart failure: Secondary | ICD-10-CM | POA: Insufficient documentation

## 2022-05-13 DIAGNOSIS — E860 Dehydration: Secondary | ICD-10-CM | POA: Insufficient documentation

## 2022-05-13 DIAGNOSIS — Z79899 Other long term (current) drug therapy: Secondary | ICD-10-CM | POA: Insufficient documentation

## 2022-05-13 DIAGNOSIS — S0101XA Laceration without foreign body of scalp, initial encounter: Secondary | ICD-10-CM | POA: Insufficient documentation

## 2022-05-13 DIAGNOSIS — N3941 Urge incontinence: Secondary | ICD-10-CM | POA: Insufficient documentation

## 2022-05-13 DIAGNOSIS — I700 Atherosclerosis of aorta: Secondary | ICD-10-CM | POA: Diagnosis not present

## 2022-05-13 DIAGNOSIS — W19XXXA Unspecified fall, initial encounter: Secondary | ICD-10-CM | POA: Diagnosis not present

## 2022-05-13 DIAGNOSIS — S0003XA Contusion of scalp, initial encounter: Secondary | ICD-10-CM

## 2022-05-13 DIAGNOSIS — I5032 Chronic diastolic (congestive) heart failure: Secondary | ICD-10-CM | POA: Diagnosis not present

## 2022-05-13 DIAGNOSIS — R0902 Hypoxemia: Secondary | ICD-10-CM | POA: Diagnosis not present

## 2022-05-13 DIAGNOSIS — Z23 Encounter for immunization: Secondary | ICD-10-CM | POA: Insufficient documentation

## 2022-05-13 DIAGNOSIS — Z8546 Personal history of malignant neoplasm of prostate: Secondary | ICD-10-CM | POA: Insufficient documentation

## 2022-05-13 DIAGNOSIS — M4800 Spinal stenosis, site unspecified: Secondary | ICD-10-CM

## 2022-05-13 DIAGNOSIS — Z6831 Body mass index (BMI) 31.0-31.9, adult: Secondary | ICD-10-CM | POA: Diagnosis not present

## 2022-05-13 DIAGNOSIS — N179 Acute kidney failure, unspecified: Secondary | ICD-10-CM | POA: Diagnosis not present

## 2022-05-13 DIAGNOSIS — R739 Hyperglycemia, unspecified: Secondary | ICD-10-CM | POA: Diagnosis not present

## 2022-05-13 DIAGNOSIS — Z87891 Personal history of nicotine dependence: Secondary | ICD-10-CM | POA: Insufficient documentation

## 2022-05-13 DIAGNOSIS — R55 Syncope and collapse: Secondary | ICD-10-CM | POA: Diagnosis not present

## 2022-05-13 DIAGNOSIS — R519 Headache, unspecified: Secondary | ICD-10-CM | POA: Diagnosis not present

## 2022-05-13 DIAGNOSIS — M542 Cervicalgia: Secondary | ICD-10-CM | POA: Diagnosis not present

## 2022-05-13 DIAGNOSIS — Z043 Encounter for examination and observation following other accident: Secondary | ICD-10-CM | POA: Diagnosis not present

## 2022-05-13 DIAGNOSIS — R58 Hemorrhage, not elsewhere classified: Secondary | ICD-10-CM | POA: Diagnosis not present

## 2022-05-13 DIAGNOSIS — E669 Obesity, unspecified: Secondary | ICD-10-CM

## 2022-05-13 LAB — BASIC METABOLIC PANEL
Anion gap: 11 (ref 5–15)
BUN: 21 mg/dL (ref 8–23)
CO2: 26 mmol/L (ref 22–32)
Calcium: 8.8 mg/dL — ABNORMAL LOW (ref 8.9–10.3)
Chloride: 100 mmol/L (ref 98–111)
Creatinine, Ser: 1.58 mg/dL — ABNORMAL HIGH (ref 0.61–1.24)
GFR, Estimated: 42 mL/min — ABNORMAL LOW (ref >60)
Glucose, Bld: 155 mg/dL — ABNORMAL HIGH (ref 70–99)
Potassium: 3.6 mmol/L (ref 3.5–5.1)
Sodium: 137 mmol/L (ref 135–145)

## 2022-05-13 LAB — CBC
HCT: 46.1 % (ref 39.0–52.0)
Hemoglobin: 15.7 g/dL (ref 13.0–17.0)
MCH: 32.2 pg (ref 26.0–34.0)
MCHC: 34.1 g/dL (ref 30.0–36.0)
MCV: 94.5 fL (ref 80.0–100.0)
Platelets: 180 10*3/uL (ref 150–400)
RBC: 4.88 MIL/uL (ref 4.22–5.81)
RDW: 12.7 % (ref 11.5–15.5)
WBC: 8.3 10*3/uL (ref 4.0–10.5)
nRBC: 0 % (ref 0.0–0.2)

## 2022-05-13 LAB — CBG MONITORING, ED: Glucose-Capillary: 161 mg/dL — ABNORMAL HIGH (ref 70–99)

## 2022-05-13 LAB — URINALYSIS, ROUTINE W REFLEX MICROSCOPIC
Bacteria, UA: NONE SEEN
Bilirubin Urine: NEGATIVE
Glucose, UA: NEGATIVE mg/dL
Ketones, ur: NEGATIVE mg/dL
Nitrite: NEGATIVE
Protein, ur: NEGATIVE mg/dL
Specific Gravity, Urine: 1.009 (ref 1.005–1.030)
WBC, UA: >50 WBC/hpf — ABNORMAL HIGH (ref 0–5)
pH: 6 (ref 5.0–8.0)

## 2022-05-13 LAB — TROPONIN I (HIGH SENSITIVITY)
Troponin I (High Sensitivity): 5 ng/L (ref <18)
Troponin I (High Sensitivity): 5 ng/L (ref <18)

## 2022-05-13 MED ORDER — SODIUM CHLORIDE 0.9 % IV BOLUS
500.0000 mL | Freq: Once | INTRAVENOUS | Status: AC
  Administered 2022-05-13: 500 mL via INTRAVENOUS

## 2022-05-13 MED ORDER — ACETAMINOPHEN 325 MG PO TABS: 650.0000 mg | ORAL_TABLET | Freq: Four times a day (QID) | ORAL | Status: DC | PRN

## 2022-05-13 MED ORDER — HYDROCODONE-ACETAMINOPHEN 7.5-325 MG PO TABS
1.0000 | ORAL_TABLET | ORAL | Status: DC | PRN
  Administered 2022-05-13: 1 via ORAL
  Filled 2022-05-13: qty 1

## 2022-05-13 MED ORDER — SODIUM CHLORIDE 0.9 % IV SOLN: INTRAVENOUS | Status: AC

## 2022-05-13 MED ORDER — TETANUS-DIPHTH-ACELL PERTUSSIS 5-2.5-18.5 LF-MCG/0.5 IM SUSY
0.5000 mL | PREFILLED_SYRINGE | Freq: Once | INTRAMUSCULAR | Status: AC
  Administered 2022-05-13: 0.5 mL via INTRAMUSCULAR
  Filled 2022-05-13: qty 0.5

## 2022-05-13 MED ORDER — LOTEPREDNOL ETABONATE 0.5 % OP SUSP
1.0000 [drp] | Freq: Every day | OPHTHALMIC | Status: DC
  Administered 2022-05-13: 1 [drp] via OPHTHALMIC
  Filled 2022-05-13: qty 5

## 2022-05-13 MED ORDER — LIDOCAINE-EPINEPHRINE (PF) 2 %-1:200000 IJ SOLN
10.0000 mL | Freq: Once | INTRAMUSCULAR | Status: AC
  Administered 2022-05-13: 10 mL
  Filled 2022-05-13: qty 20

## 2022-05-13 MED ORDER — OXYBUTYNIN CHLORIDE ER 5 MG PO TB24
10.0000 mg | ORAL_TABLET | Freq: Every day | ORAL | Status: DC
  Administered 2022-05-14: 10 mg via ORAL
  Filled 2022-05-13: qty 2

## 2022-05-13 MED ORDER — VALACYCLOVIR HCL 500 MG PO TABS
1000.0000 mg | ORAL_TABLET | Freq: Every day | ORAL | Status: DC
  Administered 2022-05-14: 1000 mg via ORAL
  Filled 2022-05-13: qty 2

## 2022-05-13 MED ORDER — ACETAMINOPHEN 650 MG RE SUPP: 650.0000 mg | Freq: Four times a day (QID) | RECTAL | Status: DC | PRN

## 2022-05-13 MED ORDER — LISINOPRIL 10 MG PO TABS
10.0000 mg | ORAL_TABLET | Freq: Every day | ORAL | Status: DC
  Filled 2022-05-13: qty 1

## 2022-05-13 MED ORDER — PREDNISOLONE ACETATE 1 % OP SUSP
1.0000 [drp] | Freq: Two times a day (BID) | OPHTHALMIC | Status: DC
  Administered 2022-05-13 – 2022-05-14 (×2): 1 [drp] via OPHTHALMIC
  Filled 2022-05-13: qty 1

## 2022-05-13 NOTE — H&P (Signed)
History and Physical    Patient: Tracy Pratt UYQ:[id number] DOB: Sep 05, 1931 DOA: 05/13/2022 DOS: the patient was seen and examined on 05/13/2022 PCP: [name], MD  Patient coming from: Home  Chief Complaint:  Chief Complaint  Patient presents with   Near Syncope   Head Injury   HPI: Tracy Pratt is a 88 y.o. male with medical history significant of prostate cancer status post cryoablation of the prostate, urge incontinence, s/p left corneal transplant, hypertension who presents to the emergency department due to possible syncopal episode and head injury.  Patient states he was helping to put groceries in the church pant[employer], he went to home after this, ate some meal, went to the stores to pick up some more groceries for the church pantry, while taking the groceries from his car to the pantry, he had a transitory vision change on his left eye (not unusual since corneal transplant), he sat down on a stool to rest, the next thing he remembers was waking up on the floor with pool of blood around his head.  He got up and cleaned the floor and he was brought to the ED by family member.  He complained of dull headache on the back of his head.  Patient states that he does not drink much fluid due to his history of urge incontinence, daughter at bedside, states that patient has not been taking his fluid pill regularly due to the inconvenience of the urge incontinence.  He denies chest pain, shortness of breath, fever, chills, nausea, vomiting   ED Course:  In the emergency department, he was hemodynamically stable.  Work-up in the ED showed normal CBC, BMP was normal except for hyperglycemia, BUN/creatinine 21/1.58 (baseline creatinine at 1.0-1.2), troponin x2 was negative. CT of head without contrast showed no acute intracranial abnormalities. Laceration and small hematoma over the posterior scalp was noted. CT cervical spine without contrast showed no fracture or acute malalignment in the  cervical spine. Laceration of the scalp was sutured, Tdap was given, IV hydration was provided.  Hospitalist was asked to admit patient for further evaluation and management.  Review of Systems: Review of systems as noted in the HPI. All other systems reviewed and are negative.   Past Medical History:  Diagnosis Date   Adrenal adenoma    Left   Arthritis    BPH (benign prostatic hyperplasia)    Cataract 2007, 2009   Bilateral   Colon polyps    Essential hypertension    Heart murmur    HX OF 30 YEARS AGO   Helicobacter pylori gastritis 09/09/2018   Malignant hyperthermia    Prostate cancer ([place]) 08/08/2007   Seed Implant and XRT   Spondylolisthesis    L5-S1   Past Surgical History:  Procedure Laterality Date   [place]   BIOPSY  08/25/2018   Procedure: BIOPSY;  Surgeon: Gatha Mayer, MD;  Location: [facility];  Service: Endoscopy;;   CATARACT EXTRACTION  2007   COLONOSCOPY  2012   [facility]: single diminutive hyperplastic polyp in rectum   CORNEAL TRANSPLANT  2007   CRYOABLATION N/A 06/09/2021   Procedure: CRYO ABLATION PROSTATE;  Surgeon: Cleon Gustin, MD;  Location: [facility] ORS;  Service: Urology;  Laterality: N/A;   ESOPHAGOGASTRODUODENOSCOPY (EGD) WITH PROPOFOL N/A 08/25/2018   Empiric dilation of esophagus, erythematous mucosa in antrum s/p biopsy, normal duodenum. Clotest positive.   KNEE SURGERY  1996   left knee  arthoscopic   MALONEY DILATION  08/25/2018   Procedure: Venia Minks DILATION;  Surgeon: Gatha Mayer, MD;  Location: [facility];  Service: Endoscopy;;   ROTATOR CUFF REPAIR  2015   Torn meniscus left knee  [place]    Social History:  reports that he has quit smoking. His smoking use included cigarettes. He has a 15.00 pack-year smoking history. He has never used smokeless tobacco. He reports that he does not drink alcohol and does not use drugs.   Allergies  Allergen  Reactions   Diclofenac Sodium Anaphylaxis   Nabumetone Anaphylaxis   Nsaids Anaphylaxis   Tolmetin Anaphylaxis   Bee Venom Swelling   Desflurane Other (See Comments)    Malignant hyperthermia   Enflurane Other (See Comments)    Malignant hyperthermia   Halothane Other (See Comments)    Malignant hyperthermia   Isoflurane Other (See Comments)    Malignant hyperthermia   Sevoflurane Other (See Comments)    Malignant hyperthermia   Succinylcholine Other (See Comments)    Malignant hyperthermia    Family History  Problem Relation Age of Onset   Prostate cancer Brother    Lung disease Brother        lobectomy     Prior to Admission medications   Medication Sig Start Date End Date Taking? Authorizing Provider  HYDROcodone-acetaminophen (NORCO) 7.5-325 MG tablet TAKE 1 TABLET EVERY 4 HOURS AS NEEDED FOR PAIN. 03/21/22  Yes Carole Civil, MD  levocetirizine (XYZAL) 5 MG tablet Take 5 mg by mouth daily. 02/14/21  Yes [provider]  lisinopril-hydrochlorothiazide (PRINZIDE,ZESTORETIC) 10-12.5 MG tablet Take 1 tablet by mouth daily.   Yes [provider]  loteprednol (LOTEMAX) 0.5 % ophthalmic suspension Place 1 drop into the right eye at bedtime.   Yes [provider]  oxybutynin (DITROPAN-XL) 10 MG 24 hr tablet Take 10 mg by mouth daily. 04/21/22  Yes [provider]  Polyethyl Glycol-Propyl Glycol (SYSTANE OP) Apply 1 drop to eye 2 (two) times daily.   Yes [provider]  prednisoLONE acetate (PRED FORTE) 1 % ophthalmic suspension Place 1 drop into the left eye 2 (two) times daily. 01/18/21  Yes [provider]  valACYclovir (VALTREX) 1000 MG tablet Take 1,000 mg by mouth daily. 04/28/22  Yes [provider]  Vibegron (GEMTESA) 75 MG TABS  01/12/22   [provider]    Physical Exam: BP (!) 143/77 (BP Location: Right Arm)   Pulse 71   Temp 98.5 F (36.9 C) (Oral)   Resp 18   Ht 5' 9.5" (1.765 m)   Wt 96.8  kg   SpO2 98%   BMI 31.05 kg/m   General: 88 y.o. year-old male well developed well nourished in no acute distress.  Alert and oriented x3. HEENT: EOMI, suture of laceration in occipital area with bandage.  Dry mucous membrane Neck: Supple, trachea medial Cardiovascular: Regular rate and rhythm with no rubs or gallops.  No thyromegaly or JVD noted.  No lower extremity edema. 2/4 pulses in all 4 extremities. Respiratory: Clear to auscultation with no wheezes or rales. Good inspiratory effort. Abdomen: Soft, nontender nondistended with normal bowel sounds x4 quadrants. Muskuloskeletal: No cyanosis, clubbing or edema noted bilaterally Neuro: CN II-XII intact, strength 5/5 x 4, sensation, reflexes intact Skin: No ulcerative lesions noted or rashes Psychiatry: Judgement and insight appear normal. Mood is appropriate for condition and setting          Labs  on Admission:  Basic Metabolic Panel: Recent Labs  Lab 05/13/22 1541  NA 137  K 3.6  CL 100  CO2 26  GLUCOSE 155*  BUN 21  CREATININE 1.58*  CALCIUM 8.8*   Liver Function Tests: No results for input(s): "AST", "ALT", "ALKPHOS", "BILITOT", "PROT", "ALBUMIN" in the last 168 hours. No results for input(s): "LIPASE", "AMYLASE" in the last 168 hours. No results for input(s): "AMMONIA" in the last 168 hours. CBC: Recent Labs  Lab 05/13/22 1541  WBC 8.3  HGB 15.7  HCT 46.1  MCV 94.5  PLT 180   Cardiac Enzymes: No results for input(s): "CKTOTAL", "CKMB", "CKMBINDEX", "TROPONINI" in the last 168 hours.  BNP (last 3 results) No results for input(s): "BNP" in the last 8760 hours.  ProBNP (last 3 results) No results for input(s): "PROBNP" in the last 8760 hours.  CBG: Recent Labs  Lab 05/13/22 1639  GLUCAP 161*    Radiological Exams on Admission: CT HEAD WO CONTRAST  Result Date: 05/13/2022 CLINICAL DATA:  Pain after fall. EXAM: CT HEAD WITHOUT CONTRAST CT CERVICAL SPINE WITHOUT CONTRAST TECHNIQUE: Multidetector CT  imaging of the head and cervical spine was performed following the standard protocol without intravenous contrast. Multiplanar CT image reconstructions of the cervical spine were also generated. RADIATION DOSE REDUCTION: This exam was performed according to the departmental dose-optimization program which includes automated exposure control, adjustment of the mA and/or kV according to patient size and/or use of iterative reconstruction technique. COMPARISON:  CT scan of the brain and cervical spine October 01, 2009 FINDINGS: CT HEAD FINDINGS Brain: No subdural, epidural, or subarachnoid hemorrhage identified. 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 Ventricles and sulci are again prominent consistent with atrophy, stable. The left cerebral hemisphere is smaller than the right, stable. Cerebellum and brainstem are normal. Basal cisterns are normal. No mass effect or midline shift. Scattered white matter changes. No acute cortical ischemia or infarct. Vascular: No hyperdense vessel or unexpected calcification. Skull: Normal. Negative for fracture or focal lesion. Sinuses/Orbits: No acute finding. Other: A laceration and small hematoma are identified in the posterior scalp. CT CERVICAL SPINE FINDINGS Alignment: Trace anterolisthesis of C7 versus T1 is stable since 2010. Otherwise, straightening of normal lordosis but no other malalignment. Skull base and vertebrae: No acute fracture. No primary bone lesion or focal pathologic process. Soft tissues and spinal canal: No prevertebral fluid or swelling. No visible canal hematoma. Disc levels: Multilevel degenerative disc disease and facet degenerative changes. Upper chest: Negative. Other: No other abnormalities. IMPRESSION: 1. No acute intracranial abnormalities. Ventricular and sulcal prominence is stable as is a genesis of the corpus callosum. Scattered white matter changes. 2. Laceration and small hematoma over the posterior scalp. 3. No fracture or acute  malalignment in the cervical spine. Degenerative changes. Electronically Signed   By: Dorise Bullion III M.D.   On: 05/13/2022 16:30   CT Cervical Spine Wo Contrast  Result Date: 05/13/2022 CLINICAL DATA:  Pain after fall. EXAM: CT HEAD WITHOUT CONTRAST CT CERVICAL SPINE WITHOUT CONTRAST TECHNIQUE: Multidetector CT imaging of the head and cervical spine was performed following the standard protocol without intravenous contrast. Multiplanar CT image reconstructions of the cervical spine were also generated. RADIATION DOSE REDUCTION: This exam was performed according to the departmental dose-optimization program which includes automated exposure control, adjustment of the mA and/or kV according to patient size and/or use of iterative reconstruction technique. COMPARISON:  CT scan of the brain and cervical spine October 01, 2009 FINDINGS: CT HEAD FINDINGS  Brain: No subdural, epidural, or subarachnoid hemorrhage identified. 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 Ventricles and sulci are again prominent consistent with atrophy, stable. The left cerebral hemisphere is smaller than the right, stable. Cerebellum and brainstem are normal. Basal cisterns are normal. No mass effect or midline shift. Scattered white matter changes. No acute cortical ischemia or infarct. Vascular: No hyperdense vessel or unexpected calcification. Skull: Normal. Negative for fracture or focal lesion. Sinuses/Orbits: No acute finding. Other: A laceration and small hematoma are identified in the posterior scalp. CT CERVICAL SPINE FINDINGS Alignment: Trace anterolisthesis of C7 versus T1 is stable since 2010. Otherwise, straightening of normal lordosis but no other malalignment. Skull base and vertebrae: No acute fracture. No primary bone lesion or focal pathologic process. Soft tissues and spinal canal: No prevertebral fluid or swelling. No visible canal hematoma. Disc levels: Multilevel degenerative disc disease and facet  degenerative changes. Upper chest: Negative. Other: No other abnormalities. IMPRESSION: 1. No acute intracranial abnormalities. Ventricular and sulcal prominence is stable as is a genesis of the corpus callosum. Scattered white matter changes. 2. Laceration and small hematoma over the posterior scalp. 3. No fracture or acute malalignment in the cervical spine. Degenerative changes. Electronically Signed   By: Dorise Bullion III M.D.   On: 05/13/2022 16:30   DG Chest 1 View  Result Date: 05/13/2022 CLINICAL DATA:  Fall EXAM: CHEST  1 VIEW COMPARISON:  01/19/2021 FINDINGS: The heart size and mediastinal contours are within normal limits. Aortic atherosclerosis. Low lung volumes. No focal airspace consolidation, pleural effusion, or pneumothorax. The visualized skeletal structures are unremarkable. IMPRESSION: No active disease. Electronically Signed   By: Davina Poke D.O.   On: 05/13/2022 16:29    EKG: I independently viewed the EKG done and my findings are as followed: Normal sinus rhythm at a rate of 85 bpm with VPCs  Assessment/Plan Present on Admission:  Syncope  Principal Problem:   Syncope Active Problems:   Essential hypertension   Scalp laceration   Scalp hematoma   AKI (acute kidney injury) ([place])   Dehydration   Hyperglycemia   Obesity (BMI 30-39.9)   Chronic diastolic CHF (congestive heart failure) (HCC)   Urge incontinence   History of prostate cancer   Spinal stenosis  Syncope Continue telemetry and watch for arrhythmias Troponins x 2 was negative EKG showed normal sinus rhythm at a rate of 85 bpm with VPCs Echocardiogram done on 06/27/2017 showed LVEF of 60 to 70%.  No RWMA.Bing Neighbors DD Echocardiogram will be done to rule out significant aortic stenosis or other outflow obstruction, and also to evaluate EF and to rule out segmental/Regional wall motion abnormalities.  Carotid artery Dopplers will be done to rule out hemodynamically significant stenosis  Laceration and  hematoma over posterior scalp s/p suture of laceration Laceration was sutured and bandaged Continue wound care  Acute kidney injury BUN/creatinine 21/1.58 (baseline creatinine at 1.0-1.2 Continue gentle hydration Renally adjust medications, avoid nephrotoxic agents/dehydration/hypotension  Dehydration Continue IV hydration  Hyperglycemia possibly reactive CBG 155, patient has no history of T2DM Continue to monitor blood glucose level with morning labs  Chronic spinal stenosis Continue Norco  Essential hypertension Continue lisinopril  Chronic diastolic CHF Echocardiogram done on 06/27/2017 showed LVEF of 60 to 70%.  No RWMA.Bing Neighbors DD Patient does not appear to be fluid overloaded at this time Continue total input/output, daily weights and fluid restriction Continue Cardiac diet  Echocardiogram in the morning   Urge incontinence Continue oxybutynin  History  of prostate cancer status post cryoablation of the prostate Stable   Obesity (BMI 31.73 kg/m) Continue diet and lifestyle modification  DVT prophylaxis: SCDs  Code Status: Full code  Consults: None   Family Communication: Daughter at bedside (all questions answered to satisfaction)  Severity of Illness: The appropriate patient status for this patient is OBSERVATION. Observation status is judged to be reasonable and necessary in order to provide the required intensity of service to ensure the patient's safety. The patient's presenting symptoms, physical exam findings, and initial radiographic and laboratory data in the context of their medical condition is felt to place them at decreased risk for further clinical deterioration. Furthermore, it is anticipated that the patient will be medically stable for discharge from the hospital within 2 midnights of admission.   Author: Bernadette Hoit, DO 05/13/2022 8:58 PM  For on call review [email or web address].

## 2022-05-13 NOTE — ED Triage Notes (Signed)
"  Was putting groceries away and was sitting on stool and the next thing I remember I woke up on the floor and had to wipe up blood from the floor where my head was bleeding" per pt Does not recall any symptoms prior to falling. Denies any symptoms at this time on arrival. Bandage in place to back of head. Bleeding controlled.

## 2022-05-13 NOTE — ED Provider Notes (Signed)
[facility] EMERGENCY DEPARTMENT Provider Note   CSN: [id number] Arrival date & time: 05/13/22  1515     History  Chief Complaint  Patient presents with   Near Syncope   Head Injury    Tracy Pratt is a 88 y.o. male.   Head Injury Patient presents after syncope and head injury.  Was putting light groceries at [employer].  States he had a little vision changes in his left eye which is not unusual for him.  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  However then does not know what happened but woke up on the floor laying down with blood in both his hands in a pool of blood around his head.  He got up cleaned up the floor in his hands.  Came here.  Reportedly had a moderate amount of blood.  Does have a dull headache on the back of his head.  No chest pain.  No trouble breathing.  Has been doing well.  Has eaten about 2 hours prior to the event.  Had a near syncopal episode which appears to be around 5 years ago now.    Past Medical History:  Diagnosis Date   Adrenal adenoma    Left   Arthritis    BPH (benign prostatic hyperplasia)    Cataract 2007, 2009   Bilateral   Colon polyps    Essential hypertension    Heart murmur    HX OF 30 YEARS AGO   Helicobacter pylori gastritis 09/09/2018   Malignant hyperthermia    Prostate cancer ([place]) 08/08/2007   Seed Implant and XRT   Spondylolisthesis    L5-S1    Home Medications Prior to Admission medications   Medication Sig Start Date End Date Taking? Authorizing Provider  HYDROcodone-acetaminophen (NORCO) 7.5-325 MG tablet TAKE 1 TABLET EVERY 4 HOURS AS NEEDED FOR PAIN. 03/21/22  Yes Carole Civil, MD  levocetirizine (XYZAL) 5 MG tablet Take 5 mg by mouth daily. 02/14/21  Yes [provider]  lisinopril-hydrochlorothiazide (PRINZIDE,ZESTORETIC) 10-12.5 MG tablet Take 1 tablet by mouth daily.   Yes [provider]  loteprednol (LOTEMAX) 0.5 % ophthalmic suspension Place 1 drop into the right eye at  bedtime.   Yes [provider]  oxybutynin (DITROPAN-XL) 10 MG 24 hr tablet Take 10 mg by mouth daily. 04/21/22  Yes [provider]  Polyethyl Glycol-Propyl Glycol (SYSTANE OP) Apply 1 drop to eye 2 (two) times daily.   Yes [provider]  prednisoLONE acetate (PRED FORTE) 1 % ophthalmic suspension Place 1 drop into the left eye 2 (two) times daily. 01/18/21  Yes [provider]  valACYclovir (VALTREX) 1000 MG tablet Take 1,000 mg by mouth daily. 04/28/22  Yes [provider]  Vibegron (GEMTESA) 75 MG TABS  01/12/22   [provider]      Allergies    Diclofenac sodium, Nabumetone, Nsaids, Tolmetin, Bee venom, Desflurane, Enflurane, Halothane, Isoflurane, Sevoflurane, and Succinylcholine    Review of Systems   Review of Systems  Physical Exam Updated Vital Signs BP (!) 142/82   Pulse 72   Temp 98.2 F (36.8 C) (Oral)   Resp 15   Ht 5' 9.5" (1.765 m)   Wt 98.9 kg   SpO2 95%   BMI 31.73 kg/m  Physical Exam Vitals and nursing note reviewed.  HENT:     Head:     Comments: Laceration on occipital area.  Approximately 4 to 5 cm long but somewhat U-shaped. Eyes:  Extraocular Movements: Extraocular movements intact.  Cardiovascular:     Rate and Rhythm: Regular rhythm.  Pulmonary:     Effort: Pulmonary effort is normal.  Musculoskeletal:     Cervical back: Neck supple. No tenderness.  Skin:    General: Skin is warm.     Capillary Refill: Capillary refill takes less than 2 seconds.  Neurological:     Mental Status: He is alert and oriented to person, place, and time.     ED Results / Procedures / Treatments   Labs (all labs ordered are listed, but only abnormal results are displayed) Labs Reviewed  BASIC METABOLIC PANEL - Abnormal; Notable for the following components:      Result Value   Glucose, Bld 155 (*)    Creatinine, Ser 1.58 (*)    Calcium 8.8 (*)    GFR, Estimated 42 (*)    All other components within  normal limits  CBG MONITORING, ED - Abnormal; Notable for the following components:   Glucose-Capillary 161 (*)    All other components within normal limits  CBC  URINALYSIS, ROUTINE W REFLEX MICROSCOPIC  TROPONIN I (HIGH SENSITIVITY)  TROPONIN I (HIGH SENSITIVITY)    EKG EKG Interpretation  Date/Time:  Sunday May 13 2022 15:28:08 EDT Ventricular Rate:  85 PR Interval:  208 QRS Duration: 119 QT Interval:  405 QTC Calculation: 482 R Axis:   -74 Text Interpretation: Sinus rhythm Ventricular premature complex LAD, consider left anterior fascicular block Baseline wander in lead(s) II III aVF pvcs are new Confirmed by Davonna Belling [phone]) on 05/13/2022 3:40:14 PM  Radiology CT HEAD WO CONTRAST  Result Date: 05/13/2022 CLINICAL DATA:  Pain after fall. EXAM: CT HEAD WITHOUT CONTRAST CT CERVICAL SPINE WITHOUT CONTRAST TECHNIQUE: Multidetector CT imaging of the head and cervical spine was performed following the standard protocol without intravenous contrast. Multiplanar CT image reconstructions of the cervical spine were also generated. RADIATION DOSE REDUCTION: This exam was performed according to the departmental dose-optimization program which includes automated exposure control, adjustment of the mA and/or kV according to patient size and/or use of iterative reconstruction technique. COMPARISON:  CT scan of the brain and cervical spine October 01, 2009 FINDINGS: CT HEAD FINDINGS Brain: No subdural, epidural, or subarachnoid hemorrhage identified. 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 Ventricles and sulci are again prominent consistent with atrophy, stable. The left cerebral hemisphere is smaller than the right, stable. Cerebellum and brainstem are normal. Basal cisterns are normal. No mass effect or midline shift. Scattered white matter changes. No acute cortical ischemia or infarct. Vascular: No hyperdense vessel or unexpected calcification. Skull: Normal. Negative for fracture  or focal lesion. Sinuses/Orbits: No acute finding. Other: A laceration and small hematoma are identified in the posterior scalp. CT CERVICAL SPINE FINDINGS Alignment: Trace anterolisthesis of C7 versus T1 is stable since 2010. Otherwise, straightening of normal lordosis but no other malalignment. Skull base and vertebrae: No acute fracture. No primary bone lesion or focal pathologic process. Soft tissues and spinal canal: No prevertebral fluid or swelling. No visible canal hematoma. Disc levels: Multilevel degenerative disc disease and facet degenerative changes. Upper chest: Negative. Other: No other abnormalities. IMPRESSION: 1. No acute intracranial abnormalities. Ventricular and sulcal prominence is stable as is a genesis of the corpus callosum. Scattered white matter changes. 2. Laceration and small hematoma over the posterior scalp. 3. No fracture or acute malalignment in the cervical spine. Degenerative changes. Electronically Signed   By: Dorise Bullion III M.D.   On: 05/13/2022  16:30   CT Cervical Spine Wo Contrast  Result Date: 05/13/2022 CLINICAL DATA:  Pain after fall. EXAM: CT HEAD WITHOUT CONTRAST CT CERVICAL SPINE WITHOUT CONTRAST TECHNIQUE: Multidetector CT imaging of the head and cervical spine was performed following the standard protocol without intravenous contrast. Multiplanar CT image reconstructions of the cervical spine were also generated. RADIATION DOSE REDUCTION: This exam was performed according to the departmental dose-optimization program which includes automated exposure control, adjustment of the mA and/or kV according to patient size and/or use of iterative reconstruction technique. COMPARISON:  CT scan of the brain and cervical spine October 01, 2009 FINDINGS: CT HEAD FINDINGS Brain: No subdural, epidural, or subarachnoid hemorrhage identified. 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 Ventricles and sulci are again prominent consistent with atrophy, stable. The left  cerebral hemisphere is smaller than the right, stable. Cerebellum and brainstem are normal. Basal cisterns are normal. No mass effect or midline shift. Scattered white matter changes. No acute cortical ischemia or infarct. Vascular: No hyperdense vessel or unexpected calcification. Skull: Normal. Negative for fracture or focal lesion. Sinuses/Orbits: No acute finding. Other: A laceration and small hematoma are identified in the posterior scalp. CT CERVICAL SPINE FINDINGS Alignment: Trace anterolisthesis of C7 versus T1 is stable since 2010. Otherwise, straightening of normal lordosis but no other malalignment. Skull base and vertebrae: No acute fracture. No primary bone lesion or focal pathologic process. Soft tissues and spinal canal: No prevertebral fluid or swelling. No visible canal hematoma. Disc levels: Multilevel degenerative disc disease and facet degenerative changes. Upper chest: Negative. Other: No other abnormalities. IMPRESSION: 1. No acute intracranial abnormalities. Ventricular and sulcal prominence is stable as is a genesis of the corpus callosum. Scattered white matter changes. 2. Laceration and small hematoma over the posterior scalp. 3. No fracture or acute malalignment in the cervical spine. Degenerative changes. Electronically Signed   By: Dorise Bullion III M.D.   On: 05/13/2022 16:30   DG Chest 1 View  Result Date: 05/13/2022 CLINICAL DATA:  Fall EXAM: CHEST  1 VIEW COMPARISON:  01/19/2021 FINDINGS: The heart size and mediastinal contours are within normal limits. Aortic atherosclerosis. Low lung volumes. No focal airspace consolidation, pleural effusion, or pneumothorax. The visualized skeletal structures are unremarkable. IMPRESSION: No active disease. Electronically Signed   By: Davina Poke D.O.   On: 05/13/2022 16:29    Procedures Procedures    Medications Ordered in ED Medications  Tdap (BOOSTRIX) injection 0.5 mL (0.5 mLs Intramuscular Given 05/13/22 1811)   lidocaine-EPINEPHrine (XYLOCAINE W/EPI) 2 %-1:200000 (PF) injection 10 mL (10 mLs Infiltration Given by Other 05/13/22 1814)  sodium chloride 0.9 % bolus 500 mL (500 mLs Intravenous New Bag/Given 05/13/22 1814)    ED Course/ Medical Decision Making/ A&P                           Medical Decision Making Amount and/or Complexity of Data Reviewed Labs: ordered. Radiology: ordered.  Risk Prescription drug management.   Patient presents after syncopal episode.  Came without much warning.  Does have some slight blurring in left eye that is been going on and off.  Unsure if that was a precursor to the syncope or related to corneal transplant that he has had with some complications.  However states he did not really know what was coming.  Just woke up on the floor.  Did have large scalp laceration that was closed.  There is appears to be some missing tissue however.  Creatinine is mildly increased.  With further questioning did eat and drink a little less today.  EKG is reassuring except for some new PVCs.  Head CT and cervical spine CT independently interpreted showed no fracture or intracranial hemorrhage.  However with the syncope without much warning I feel he would benefit from admission to the hospital.  Has had a work-up previously but is been around 3 years ago.  Will discuss with hospitalist for admission.  Discussed with the patient's daughter who is with him         Final Clinical Impression(s) / ED Diagnoses Final diagnoses:  Syncope, unspecified syncope type  Laceration of scalp, initial encounter    Rx / DC Orders ED Discharge Orders     None         Davonna Belling, MD 05/13/22 1903

## 2022-05-13 NOTE — ED Notes (Signed)
Hospitalist at bedside 

## 2022-05-13 NOTE — ED Provider Notes (Signed)
..  Laceration Repair  Date/Time: 05/13/2022 6:41 PM  Performed by: Jacqlyn Larsen, PA-C Authorized by: Jacqlyn Larsen, PA-C   Consent:    Consent obtained:  Verbal   Consent given by:  Patient   Risks, benefits, and alternatives were discussed: yes     Risks discussed:  Infection, pain, need for additional repair, poor cosmetic result, nerve damage and poor wound healing   Alternatives discussed:  No treatment Universal protocol:    Procedure explained and questions answered to patient or proxy's satisfaction: yes     Patient identity confirmed:  Verbally with patient Anesthesia:    Anesthesia method:  Local infiltration   Local anesthetic:  Lidocaine 2% WITH epi Laceration details:    Location:  Scalp   Scalp location:  Crown   Length (cm):  4 Pre-procedure details:    Preparation:  Patient was prepped and draped in usual sterile fashion Exploration:    Hemostasis achieved with:  Direct pressure and epinephrine   Wound exploration: entire depth of wound visualized     Wound extent: areolar tissue violated   Treatment:    Area cleansed with:  Shur-Clens   Amount of cleaning:  Standard Skin repair:    Repair method:  Sutures   Suture size:  5-0   Suture material:  Prolene   Suture technique:  Simple interrupted   Number of sutures:  6 Approximation:    Approximation:  Close Repair type:    Repair type:  Simple Post-procedure details:    Dressing:  Non-adherent dressing   Procedure completion:  Tolerated well, no immediate complications     Jacqlyn Larsen, PA-C 05/13/22 1847    Davonna Belling, MD 05/14/22 1234

## 2022-05-14 ENCOUNTER — Encounter (HOSPITAL_COMMUNITY): Payer: Self-pay | Admitting: Internal Medicine

## 2022-05-14 ENCOUNTER — Observation Stay (HOSPITAL_COMMUNITY): Payer: Medicare Other

## 2022-05-14 ENCOUNTER — Observation Stay (HOSPITAL_COMMUNITY)
Admit: 2022-05-14 | Discharge: 2022-05-14 | Disposition: A | Discharge status: Home / Self Care | Payer: Medicare Other | Attending: Internal Medicine | Admitting: Internal Medicine

## 2022-05-14 ENCOUNTER — Observation Stay (HOSPITAL_BASED_OUTPATIENT_CLINIC_OR_DEPARTMENT_OTHER): Payer: Medicare Other

## 2022-05-14 DIAGNOSIS — N179 Acute kidney failure, unspecified: Secondary | ICD-10-CM | POA: Diagnosis not present

## 2022-05-14 DIAGNOSIS — I6523 Occlusion and stenosis of bilateral carotid arteries: Secondary | ICD-10-CM | POA: Diagnosis not present

## 2022-05-14 DIAGNOSIS — Q040 Congenital malformations of corpus callosum: Secondary | ICD-10-CM | POA: Diagnosis not present

## 2022-05-14 DIAGNOSIS — I10 Essential (primary) hypertension: Secondary | ICD-10-CM | POA: Diagnosis not present

## 2022-05-14 DIAGNOSIS — R55 Syncope and collapse: Secondary | ICD-10-CM

## 2022-05-14 DIAGNOSIS — R569 Unspecified convulsions: Secondary | ICD-10-CM | POA: Diagnosis not present

## 2022-05-14 DIAGNOSIS — R29818 Other symptoms and signs involving the nervous system: Secondary | ICD-10-CM | POA: Diagnosis not present

## 2022-05-14 DIAGNOSIS — E669 Obesity, unspecified: Secondary | ICD-10-CM | POA: Diagnosis not present

## 2022-05-14 DIAGNOSIS — G319 Degenerative disease of nervous system, unspecified: Secondary | ICD-10-CM | POA: Diagnosis not present

## 2022-05-14 LAB — ECHOCARDIOGRAM COMPLETE
AR max vel: 3.33 cm2
AV Area VTI: 2.98 cm2
AV Area mean vel: 3.21 cm2
AV Mean grad: 3 mmHg
AV Peak grad: 6.3 mmHg
Ao pk vel: 1.25 m/s
Area-P 1/2: 4.19 cm2
Calc EF: 62.9 %
Height: 69.5 in
MV VTI: 2.8 cm2
S' Lateral: 3.2 cm
Single Plane A2C EF: 67.7 %
Single Plane A4C EF: 57.8 %
Weight: 3442.7 oz

## 2022-05-14 LAB — CBC
HCT: 43.6 % (ref 39.0–52.0)
Hemoglobin: 14.8 g/dL (ref 13.0–17.0)
MCH: 32.6 pg (ref 26.0–34.0)
MCHC: 33.9 g/dL (ref 30.0–36.0)
MCV: 96 fL (ref 80.0–100.0)
Platelets: 176 10*3/uL (ref 150–400)
RBC: 4.54 MIL/uL (ref 4.22–5.81)
RDW: 12.9 % (ref 11.5–15.5)
WBC: 8.7 10*3/uL (ref 4.0–10.5)
nRBC: 0 % (ref 0.0–0.2)

## 2022-05-14 LAB — PHOSPHORUS: Phosphorus: 3.7 mg/dL (ref 2.5–4.6)

## 2022-05-14 LAB — COMPREHENSIVE METABOLIC PANEL
ALT: 22 U/L (ref 0–44)
AST: 17 U/L (ref 15–41)
Albumin: 3.4 g/dL — ABNORMAL LOW (ref 3.5–5.0)
Alkaline Phosphatase: 40 U/L (ref 38–126)
Anion gap: 10 (ref 5–15)
BUN: 20 mg/dL (ref 8–23)
CO2: 25 mmol/L (ref 22–32)
Calcium: 8.3 mg/dL — ABNORMAL LOW (ref 8.9–10.3)
Chloride: 104 mmol/L (ref 98–111)
Creatinine, Ser: 1.12 mg/dL (ref 0.61–1.24)
GFR, Estimated: >60 mL/min (ref >60)
Glucose, Bld: 100 mg/dL — ABNORMAL HIGH (ref 70–99)
Potassium: 3.5 mmol/L (ref 3.5–5.1)
Sodium: 139 mmol/L (ref 135–145)
Total Bilirubin: 0.7 mg/dL (ref 0.3–1.2)
Total Protein: 6 g/dL — ABNORMAL LOW (ref 6.5–8.1)

## 2022-05-14 LAB — MAGNESIUM: Magnesium: 2.1 mg/dL (ref 1.7–2.4)

## 2022-05-14 MED ORDER — POTASSIUM CHLORIDE IN NACL 20-0.9 MEQ/L-% IV SOLN: INTRAVENOUS | Status: DC

## 2022-05-14 NOTE — Assessment & Plan Note (Signed)
Repaired in the ED

## 2022-05-14 NOTE — Assessment & Plan Note (Addendum)
Secondary to volume depletion Baseline creatinine 1.0-1.2 Presented with serum creatinine 1.58 Continue IV fluids Serum creatinine 1.12 at time of dc

## 2022-05-14 NOTE — Assessment & Plan Note (Addendum)
Suspect dehydration/volume depletion but concerned about possible underlying Cardiac/neurologic abnormality EEG--normal Echo--EF 60-65%, G1DD, nl RV, no major valve abnormalities Check orthostatics>>positive by pulse HR 64>>88 Cardiology consult>>ok to d/c home, repeat work up if recurs;  2018 holter unremarkable Remain on telemetry--no concerning dysrhythmia Neuro consult>>doubt seizure MR Brain--neg for acute findings; agenesis of corpus callosum

## 2022-05-14 NOTE — Assessment & Plan Note (Signed)
Follow-up with Dr. Nicolette Bang

## 2022-05-14 NOTE — Progress Notes (Signed)
PROGRESS NOTE  Tracy Pratt MOQ:[id number] DOB: April 25, 1931 DOA: 05/13/2022 PCP: [name], MD  Brief History:  88 year old male with a history of BPH, hypertension, prostate cancer status post cryoablation presented due to near syncopal episode.  The patient states that after he helped with some groceries in his church pantry he went home and ate dinner.  He went back to the store to pick up more groceries for his pantry.  While transferring the groceries the patient had a transitory vision change in his left eye (patient had recent left corneal transplant).  The patient sat down to rest and the next thing he remembered was waking up on the floor with a pool of blood around his head.  He complained of dull headache on the back of his head after the syncopal episode.  Patient states that he had not been drinking much fluid due to his history of urge incontinence.  The patient is a difficult historian.  History is supplemented by the patient's daughter.  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  Since then, the patient has had transient visual disturbances in his left eye although it is unclear how long they usual last.  He has not had any syncopal episodes previously with transient visual disturbances.  He has no other aural type symptoms.  The patient takes as needed furosemide for lower extremity edema.  He states that he has been taking it more frequently in the past week to 2 weeks.  However, he denies any dizziness, palpitations, chest pain, shortness of breath.  He has not had any other new medications. Apparently, the patient has a history of prostate cancer with resultant urge incontinence.  He tries to avoid drinking much fluid as result of his incontinence.  The patient had a similar episode of syncope 3 to 4 years ago.  He had a stress test in 07/02/2019 which was low risk with EF 69%.  His echocardiogram showed EF 65-70% with no WMA  and no major valvular abnormalities.  In the ED, the patient was afebrile hemodynamically stable.  He was noted to have a scalp laceration that was repaired.  WBC 8.3, hemoglobin 15.7, platelets 1-80,000.  Sodium 137, potassium 3.6, bicarbonate 26, serum creatinine 1.58.     Assessment and Plan: * Syncope Suspect dehydration/volume depletion but concerned about possible underlying Cardiac/neurologic abnormality EEG Echocardiogram Check orthostatics Cardiology consult Remain on telemetry  History of prostate cancer Follow-up with Dr. Nicolette Bang  Urge incontinence Continue Gemtesa  Obesity (BMI 30-39.9) BMI 31.32 Lifestyle modification  Hyperglycemia Check hemoglobin A1c  AKI (acute kidney injury) ([place]) Secondary to volume depletion Baseline creatinine 1.0-1.2 Presented with serum creatinine 1.58 Continue IV fluids  Scalp laceration Repaired in the ED  Essential hypertension Holding lisinopril HCTZ temporarily BP controlled presently         Family Communication: daughter updated 7/17  Consultants:  neurology  Code Status:  FULL DVT Prophylaxis:  SCDs   Procedures: As Listed in Progress Note Above  Antibiotics: None      Subjective: Patient denies fevers, chills, headache, chest pain, dyspnea, nausea, vomiting, diarrhea, abdominal pain, dysuria, hematuria, hematochezia, and melena.   Objective: Vitals:   05/14/22 0130 05/14/22 0401 05/14/22 0429 05/14/22 0545  BP: 129/72  125/85   Pulse: 64  (!) 59   Resp: 18  18   Temp: 98.2 F (36.8 C)  98.3 F (36.8 C)  TempSrc: Oral  Oral   SpO2: 96%  96%   Weight:  96.8 kg  97.6 kg  Height:        Intake/Output Summary (Last 24 hours) at 05/14/2022 0834 Last data filed at 05/13/2022 2212 Gross per 24 hour  Intake 500 ml  Output 2 ml  Net 498 ml   Weight change:  Exam:  General:  Pt is alert, follows commands appropriately, not in acute distress HEENT: No icterus, No thrush, No  neck mass, [place]/AT Cardiovascular: RRR, S1/S2, no rubs, no gallops Respiratory: CTA bilaterally, no wheezing, no crackles, no rhonchi Abdomen: Soft/+BS, non tender, non distended, no guarding Extremities: No edema, No lymphangitis, No petechiae, No rashes, no synovitis Neuro:  CN II-XII intact, strength 4/5 in RUE, RLE, strength 4/5 LUE, LLE; sensation intact bilateral; no dysmetria; babinski equivocal    Data Reviewed: I have personally reviewed following labs and imaging studies Basic Metabolic Panel: Recent Labs  Lab 05/13/22 1541 05/14/22 0508  NA 137 139  K 3.6 3.5  CL 100 104  CO2 26 25  GLUCOSE 155* 100*  BUN 21 20  CREATININE 1.58* 1.12  CALCIUM 8.8* 8.3*  MG  --  2.1  PHOS  --  3.7   Liver Function Tests: Recent Labs  Lab 05/14/22 0508  AST 17  ALT 22  ALKPHOS 40  BILITOT 0.7  PROT 6.0*  ALBUMIN 3.4*   No results for input(s): "LIPASE", "AMYLASE" in the last 168 hours. No results for input(s): "AMMONIA" in the last 168 hours. Coagulation Profile: No results for input(s): "INR", "PROTIME" in the last 168 hours. CBC: Recent Labs  Lab 05/13/22 1541 05/14/22 0508  WBC 8.3 8.7  HGB 15.7 14.8  HCT 46.1 43.6  MCV 94.5 96.0  PLT 180 176   Cardiac Enzymes: No results for input(s): "CKTOTAL", "CKMB", "CKMBINDEX", "TROPONINI" in the last 168 hours. BNP: Invalid input(s): "POCBNP" CBG: Recent Labs  Lab 05/13/22 1639  GLUCAP 161*   HbA1C: No results for input(s): "HGBA1C" in the last 72 hours. Urine analysis:    Component Value Date/Time   COLORURINE YELLOW 05/13/2022 1535   APPEARANCEUR CLEAR 05/13/2022 1535   APPEARANCEUR Clear 01/22/2022 1343   LABSPEC 1.009 05/13/2022 1535   PHURINE 6.0 05/13/2022 1535   GLUCOSEU NEGATIVE 05/13/2022 1535   HGBUR SMALL (A) 05/13/2022 1535   BILIRUBINUR NEGATIVE 05/13/2022 1535   BILIRUBINUR Negative 01/22/2022 1343   KETONESUR NEGATIVE 05/13/2022 1535   PROTEINUR NEGATIVE 05/13/2022 1535   UROBILINOGEN 0.2  09/25/2014 1525   NITRITE NEGATIVE 05/13/2022 1535   LEUKOCYTESUR MODERATE (A) 05/13/2022 1535   Sepsis Labs: '@LABRCNTIP'$ (procalcitonin:4,lacticidven:4) )No results found for this or any previous visit (from the past 240 hour(s)).   Scheduled Meds:  lisinopril  10 mg Oral Daily   loteprednol  1 drop Right Eye QHS   oxybutynin  10 mg Oral Daily   prednisoLONE acetate  1 drop Left Eye BID   valACYclovir  1,000 mg Oral Daily   Continuous Infusions:  Procedures/Studies: CT HEAD WO CONTRAST  Result Date: 05/13/2022 CLINICAL DATA:  Pain after fall. EXAM: CT HEAD WITHOUT CONTRAST CT CERVICAL SPINE WITHOUT CONTRAST TECHNIQUE: Multidetector CT imaging of the head and cervical spine was performed following the standard protocol without intravenous contrast. Multiplanar CT image reconstructions of the cervical spine were also generated. RADIATION DOSE REDUCTION: This exam was performed according to the departmental dose-optimization program which includes automated exposure control, adjustment of the mA and/or kV according to patient size and/or use  of iterative reconstruction technique. COMPARISON:  CT scan of the brain and cervical spine October 01, 2009 FINDINGS: CT HEAD FINDINGS Brain: No subdural, epidural, or subarachnoid hemorrhage identified. 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 Ventricles and sulci are again prominent consistent with atrophy, stable. The left cerebral hemisphere is smaller than the right, stable. Cerebellum and brainstem are normal. Basal cisterns are normal. No mass effect or midline shift. Scattered white matter changes. No acute cortical ischemia or infarct. Vascular: No hyperdense vessel or unexpected calcification. Skull: Normal. Negative for fracture or focal lesion. Sinuses/Orbits: No acute finding. Other: A laceration and small hematoma are identified in the posterior scalp. CT CERVICAL SPINE FINDINGS Alignment: Trace anterolisthesis of C7 versus T1 is stable  since 2010. Otherwise, straightening of normal lordosis but no other malalignment. Skull base and vertebrae: No acute fracture. No primary bone lesion or focal pathologic process. Soft tissues and spinal canal: No prevertebral fluid or swelling. No visible canal hematoma. Disc levels: Multilevel degenerative disc disease and facet degenerative changes. Upper chest: Negative. Other: No other abnormalities. IMPRESSION: 1. No acute intracranial abnormalities. Ventricular and sulcal prominence is stable as is a genesis of the corpus callosum. Scattered white matter changes. 2. Laceration and small hematoma over the posterior scalp. 3. No fracture or acute malalignment in the cervical spine. Degenerative changes. Electronically Signed   By: Dorise Bullion III M.D.   On: 05/13/2022 16:30   CT Cervical Spine Wo Contrast  Result Date: 05/13/2022 CLINICAL DATA:  Pain after fall. EXAM: CT HEAD WITHOUT CONTRAST CT CERVICAL SPINE WITHOUT CONTRAST TECHNIQUE: Multidetector CT imaging of the head and cervical spine was performed following the standard protocol without intravenous contrast. Multiplanar CT image reconstructions of the cervical spine were also generated. RADIATION DOSE REDUCTION: This exam was performed according to the departmental dose-optimization program which includes automated exposure control, adjustment of the mA and/or kV according to patient size and/or use of iterative reconstruction technique. COMPARISON:  CT scan of the brain and cervical spine October 01, 2009 FINDINGS: CT HEAD FINDINGS Brain: No subdural, epidural, or subarachnoid hemorrhage identified. 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 Ventricles and sulci are again prominent consistent with atrophy, stable. The left cerebral hemisphere is smaller than the right, stable. Cerebellum and brainstem are normal. Basal cisterns are normal. No mass effect or midline shift. Scattered white matter changes. No acute cortical ischemia or  infarct. Vascular: No hyperdense vessel or unexpected calcification. Skull: Normal. Negative for fracture or focal lesion. Sinuses/Orbits: No acute finding. Other: A laceration and small hematoma are identified in the posterior scalp. CT CERVICAL SPINE FINDINGS Alignment: Trace anterolisthesis of C7 versus T1 is stable since 2010. Otherwise, straightening of normal lordosis but no other malalignment. Skull base and vertebrae: No acute fracture. No primary bone lesion or focal pathologic process. Soft tissues and spinal canal: No prevertebral fluid or swelling. No visible canal hematoma. Disc levels: Multilevel degenerative disc disease and facet degenerative changes. Upper chest: Negative. Other: No other abnormalities. IMPRESSION: 1. No acute intracranial abnormalities. Ventricular and sulcal prominence is stable as is a genesis of the corpus callosum. Scattered white matter changes. 2. Laceration and small hematoma over the posterior scalp. 3. No fracture or acute malalignment in the cervical spine. Degenerative changes. Electronically Signed   By: Dorise Bullion III M.D.   On: 05/13/2022 16:30   DG Chest 1 View  Result Date: 05/13/2022 CLINICAL DATA:  Fall EXAM: CHEST  1 VIEW COMPARISON:  01/19/2021 FINDINGS: The heart size and  mediastinal contours are within normal limits. Aortic atherosclerosis. Low lung volumes. No focal airspace consolidation, pleural effusion, or pneumothorax. The visualized skeletal structures are unremarkable. IMPRESSION: No active disease. Electronically Signed   By: Davina Poke D.O.   On: 05/13/2022 16:29    Orson Eva, DO  [facility]  If 7PM-7AM, please contact night-coverage [email or web address] Password [facility] 05/14/2022, 8:34 AM   LOS: 0 days

## 2022-05-14 NOTE — Assessment & Plan Note (Signed)
BMI 31.32 Lifestyle modification

## 2022-05-14 NOTE — Progress Notes (Signed)
*  PRELIMINARY RESULTS* Echocardiogram 2D Echocardiogram has been performed.  Tracy Pratt 05/14/2022, 9:12 AM

## 2022-05-14 NOTE — Plan of Care (Signed)

## 2022-05-14 NOTE — Discharge Summary (Signed)
Physician Discharge Summary   Patient: Armanii Pressnell MRN: [id number] DOB: 26-Jul-1931  Admit date:     05/13/2022  Discharge date: 05/14/22  Discharge Physician: Shanon Brow Ahmiyah Coil   PCP: [name], MD   Recommendations at discharge:   Please follow up with primary care provider within 1-2 weeks  Please repeat BMP and CBC in one week     Hospital Course: 88 year old male with a history of BPH, hypertension, prostate cancer status post cryoablation presented due to near syncopal episode.  The patient states that after he helped with some groceries in his church pantry he went home and ate dinner.  He went back to the store to pick up more groceries for his pantry.  While transferring the groceries the patient had a transitory vision change in his left eye (patient had recent left corneal transplant).  The patient sat down to rest and the next thing he remembered was waking up on the floor with a pool of blood around his head.  He complained of dull headache on the back of his head after the syncopal episode.  Patient states that he had not been drinking much fluid due to his history of urge incontinence.  The patient is a difficult historian.  History is supplemented by the patient's daughter.  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  Since then, the patient has had transient visual disturbances in his left eye although it is unclear how long they usual last.  He has not had any syncopal episodes previously with transient visual disturbances.  He has no other aural type symptoms.  The patient takes as needed furosemide for lower extremity edema.  He states that he has been taking it more frequently in the past week to 2 weeks.  However, he denies any dizziness, palpitations, chest pain, shortness of breath.  He has not had any other new medications. Apparently, the patient has a history of prostate cancer with resultant urge incontinence.  He tries to  avoid drinking much fluid as result of his incontinence.  The patient had a similar episode of syncope 3 to 4 years ago.  He had a stress test in 07/02/2019 which was low risk with EF 69%.  His echocardiogram showed EF 65-70% with no WMA and no major valvular abnormalities.  In the ED, the patient was afebrile hemodynamically stable.  He was noted to have a scalp laceration that was repaired.  WBC 8.3, hemoglobin 15.7, platelets 1-80,000.  Sodium 137, potassium 3.6, bicarbonate 26, serum creatinine 1.58.  Assessment and Plan: * Syncope Suspect dehydration/volume depletion but concerned about possible underlying Cardiac/neurologic abnormality EEG--normal Echo--EF 60-65%, G1DD, nl RV, no major valve abnormalities Check orthostatics>>positive by pulse HR 64>>88 Cardiology consult>>ok to d/c home, repeat work up if recurs;  2018 holter unremarkable Remain on telemetry--no concerning dysrhythmia Neuro consult>>doubt seizure MR Brain--neg for acute findings; agenesis of corpus callosum  History of prostate cancer Follow-up with Dr. Nicolette Bang  Urge incontinence Continue Ditropan outpt follow with urology  Obesity (BMI 30-39.9) BMI 31.32 Lifestyle modification  Hyperglycemia Check hemoglobin A1c after dc  AKI (acute kidney injury) ([place]) Secondary to volume depletion Baseline creatinine 1.0-1.2 Presented with serum creatinine 1.58 Continue IV fluids Serum creatinine 1.12 at time of dc  Scalp laceration Repaired in the ED  Essential hypertension Holding lisinopril HCTZ temporarily>>restart after d/c BP controlled presently         Consultants: neurology, cardiology Procedures performed: none  Disposition:  Home Diet recommendation:  Cardiac diet DISCHARGE MEDICATION: Allergies as of May 29, 2022       Reactions   Diclofenac Sodium Anaphylaxis   Nabumetone Anaphylaxis   Nsaids Anaphylaxis   Tolmetin Anaphylaxis   Bee Venom Swelling   Desflurane Other (See  Comments)   Malignant hyperthermia   Enflurane Other (See Comments)   Malignant hyperthermia   Halothane Other (See Comments)   Malignant hyperthermia   Isoflurane Other (See Comments)   Malignant hyperthermia   Sevoflurane Other (See Comments)   Malignant hyperthermia   Succinylcholine Other (See Comments)   Malignant hyperthermia        Medication List     STOP taking these medications    Gemtesa 75 MG Tabs Generic drug: Vibegron       TAKE these medications    HYDROcodone-acetaminophen 7.5-325 MG tablet Commonly known as: NORCO TAKE 1 TABLET EVERY 4 HOURS AS NEEDED FOR PAIN.   levocetirizine 5 MG tablet Commonly known as: XYZAL Take 5 mg by mouth daily.   lisinopril-hydrochlorothiazide 10-12.5 MG tablet Commonly known as: ZESTORETIC Take 1 tablet by mouth daily.   loteprednol 0.5 % ophthalmic suspension Commonly known as: LOTEMAX Place 1 drop into the right eye at bedtime.   oxybutynin 10 MG 24 hr tablet Commonly known as: DITROPAN-XL Take 10 mg by mouth daily.   prednisoLONE acetate 1 % ophthalmic suspension Commonly known as: PRED FORTE Place 1 drop into the left eye 2 (two) times daily.   SYSTANE OP Apply 1 drop to eye 2 (two) times daily.   valACYclovir 1000 MG tablet Commonly known as: VALTREX Take 1,000 mg by mouth daily.        Discharge Exam: Filed Weights   05/13/22 2035 05-29-2022 0401 2022-05-29 0545  Weight: 96.8 kg 96.8 kg 97.6 kg   HEENT:  [place]/AT, No thrush, no icterus CV:  RRR, no rub, no S3, no S4 Lung:  CTA, no wheeze, no rhonchi Abd:  soft/+BS, NT Ext:  No edema, no lymphangitis, no synovitis, no rash   Condition at discharge: stable  The results of significant diagnostics from this hospitalization (including imaging, microbiology, ancillary and laboratory) are listed below for reference.   Imaging Studies: EEG adult  Result Date: 05-29-22 Lora Havens, MD     2022/05/29  3:59 PM Patient Name: Yaron Grasse  MRN: [id number] Epilepsy Attending: Lora Havens Referring Physician/Provider: Orson Eva, MD Date: 2022-05-29 Duration: 22.26 mins Patient history: 88 year old male who presented with transient loss of consciousness. EEG to evaluate for seizure Level of alertness: Awake, asleep AEDs during EEG study: None Technical aspects: This EEG study was done with scalp electrodes positioned according to the 10-20 International system of electrode placement. Electrical activity was acquired at a sampling rate of '500Hz'$  and reviewed with a high frequency filter of '70Hz'$  and a low frequency filter of '1Hz'$ . EEG data were recorded continuously and digitally stored. Description: The posterior dominant rhythm consists of 10 Hz activity of moderate voltage (25-35 uV) seen predominantly in posterior head regions, symmetric and reactive to eye opening and eye closing. Sleep was characterized by vertex waves, sleep spindles (12 to 14 Hz), maximal frontocentral region. Physiologic photic driving was seen during photic stimulation.  Hyperventilation was not performed.   IMPRESSION: This study is within normal limits. No seizures or epileptiform discharges were seen throughout the recording. Priyanka O Yadav   US Carotid Bilateral  Result Date: 05-29-2022 CLINICAL DATA:  Syncopal episode yesterday. Post fall. History of hypertension.  Former smoker. EXAM: BILATERAL CAROTID DUPLEX ULTRASOUND TECHNIQUE: Pearline Cables scale imaging, color Doppler and duplex ultrasound were performed of bilateral carotid and vertebral arteries in the neck. COMPARISON:  12/24/2016 FINDINGS: Criteria: Quantification of carotid stenosis is based on velocity parameters that correlate the residual internal carotid diameter with NASCET-based stenosis levels, using the diameter of the distal internal carotid lumen as the denominator for stenosis measurement. The following velocity measurements were obtained: RIGHT ICA: 95/16 cm/sec CCA: 185/63 cm/sec SYSTOLIC ICA/CCA RATIO:   0.9 ECA: 127 cm/sec LEFT ICA: 60/15 cm/sec CCA: 14/97 cm/sec SYSTOLIC ICA/CCA RATIO:  0.7 ECA: 117 cm/sec RIGHT CAROTID ARTERY: There is a minimal amount of eccentric partially shadowing plaque within the right carotid bulb (image 16), extending to involve the origin and proximal aspects of the right internal carotid artery (image 24), unchanged to slightly progressed compared to the 2018 examination though not resulting in elevated peak systolic velocities within the interrogated course of the right internal carotid artery to suggest a hemodynamically significant stenosis. RIGHT VERTEBRAL ARTERY:  Antegrade Flow LEFT CAROTID ARTERY: There is a minimal amount of atherosclerotic plaque involving the left carotid bulb, extending to involve the origin and proximal aspects of the left internal carotid artery (image 58), unchanged to slightly progressed compared to the 2018 examination though not resulting in elevated peak systolic velocities within the interrogated course of the left internal carotid artery to suggest a hemodynamically significant stenosis. LEFT VERTEBRAL ARTERY:  Antegrade Flow IMPRESSION: Minimal amount of bilateral atherosclerotic plaque, right greater than left, unchanged to slightly progressed compared to the 2018 examination though not resulting in a hemodynamically significant stenosis within either internal carotid artery. Electronically Signed   By: Sandi Mariscal M.D.   On: 05/14/2022 11:51   MR BRAIN WO CONTRAST  Result Date: 05/14/2022 CLINICAL DATA:  Neuro deficit, stroke suspected EXAM: MRI HEAD WITHOUT CONTRAST TECHNIQUE: Multiplanar, multiecho pulse sequences of the brain and surrounding structures were obtained without intravenous contrast. COMPARISON:  Noncontrast CT head 1 day prior FINDINGS: Brain: There is no acute intracranial hemorrhage, extra-axial fluid collection, or acute infarct. There is agenesis of the corpus callosum with colpocephaly appearance of the lateral  ventricles. Mild global atrophy superimposed on asymmetry of the cerebral hemispheres with the left being smaller than the right is noted. Scattered small remote lacunar infarcts versus prominent perivascular spaces are noted in the left cerebral hemisphere. Additional patchy FLAIR signal abnormality in the supratentorial white matter likely reflects sequela of mild underlying chronic white matter microangiopathy. There is no mass lesion.  There is no mass effect or midline shift. Vascular: Normal flow voids. Skull and upper cervical spine: Normal marrow signal. Sinuses/Orbits: The imaged paranasal sinuses are clear. Bilateral lens implants are in place. The globes and orbits are otherwise unremarkable. Other: None. IMPRESSION: 1. No acute intracranial pathology. 2. Agenesis of the corpus callosum, unchanged. Electronically Signed   By: Valetta Mole M.D.   On: 05/14/2022 11:50   ECHOCARDIOGRAM COMPLETE  Result Date: 05/14/2022    ECHOCARDIOGRAM REPORT   Patient Name:   PEARCE LITTLEFIELD Date of Exam: 05/14/2022 Medical Rec #:  [id number]         Height:       69.5 in Accession #:    [id number]        Weight:       215.2 lb Date of Birth:  20-Jan-1931         BSA:          2.142 m Patient  Age:    65 years          BP:           125/85 mmHg Patient Gender: M                 HR:           61 bpm. Exam Location:  [facility] Procedure: 2D Echo, Cardiac Doppler and Color Doppler Indications:    Syncope  History:        Patient has prior history of Echocardiogram examinations, most                 recent 06/27/2017. CHF, Signs/Symptoms:Syncope; Risk                 Factors:Hypertension and Former Smoker. Prostate CA.  Sonographer:    Wenda Low Referring Phys: [id number] OLADAPO ADEFESO IMPRESSIONS  1. Left ventricular ejection fraction, by estimation, is 60 to 65%. The left ventricle has normal function. The left ventricle has no regional wall motion abnormalities. Left ventricular diastolic parameters are consistent  with Grade I diastolic dysfunction (impaired relaxation).  2. Right ventricular systolic function is normal. The right ventricular size is normal. There is normal pulmonary artery systolic pressure.  3. The mitral valve is normal in structure. No evidence of mitral valve regurgitation.  4. The aortic valve is normal in structure. Aortic valve regurgitation is not visualized.  5. The inferior vena cava is normal in size with greater than 50% respiratory variability, suggesting right atrial pressure of 3 mmHg. FINDINGS  Left Ventricle: Left ventricular ejection fraction, by estimation, is 60 to 65%. The left ventricle has normal function. The left ventricle has no regional wall motion abnormalities. The left ventricular internal cavity size was normal in size. There is  borderline left ventricular hypertrophy. Left ventricular diastolic parameters are consistent with Grade I diastolic dysfunction (impaired relaxation). Right Ventricle: The right ventricular size is normal. No increase in right ventricular wall thickness. Right ventricular systolic function is normal. There is normal pulmonary artery systolic pressure. The tricuspid regurgitant velocity is 2.50 m/s, and  with an assumed right atrial pressure of 3 mmHg, the estimated right ventricular systolic pressure is 27.5 mmHg. Left Atrium: Left atrial size was normal in size. Right Atrium: Right atrial size was normal in size. Pericardium: There is no evidence of pericardial effusion. Presence of epicardial fat layer. Mitral Valve: The mitral valve is normal in structure. No evidence of mitral valve regurgitation. MV peak gradient, 3.9 mmHg. The mean mitral valve gradient is 1.0 mmHg. Tricuspid Valve: The tricuspid valve is normal in structure. Tricuspid valve regurgitation is trivial. Aortic Valve: The aortic valve is normal in structure. Aortic valve regurgitation is not visualized. Aortic valve mean gradient measures 3.0 mmHg. Aortic valve peak gradient  measures 6.2 mmHg. Aortic valve area, by VTI measures 2.98 cm. Pulmonic Valve: The pulmonic valve was not well visualized. Pulmonic valve regurgitation is trivial. Aorta: The aortic root and ascending aorta are structurally normal, with no evidence of dilitation. Venous: The inferior vena cava is normal in size with greater than 50% respiratory variability, suggesting right atrial pressure of 3 mmHg. IAS/Shunts: No atrial level shunt detected by color flow Doppler.  LEFT VENTRICLE PLAX 2D LVIDd:         5.00 cm     Diastology LVIDs:         3.20 cm     LV e' medial:    7.51 cm/s LV PW:  1.10 cm     LV E/e' medial:  10.8 LV IVS:        1.00 cm     LV e' lateral:   10.00 cm/s LVOT diam:     2.10 cm     LV E/e' lateral: 8.1 LV SV:         91 LV SV Index:   42 LVOT Area:     3.46 cm  LV Volumes (MOD) LV vol d, MOD A2C: 50.4 ml LV vol d, MOD A4C: 74.4 ml LV vol s, MOD A2C: 16.3 ml LV vol s, MOD A4C: 31.4 ml LV SV MOD A2C:     34.1 ml LV SV MOD A4C:     74.4 ml LV SV MOD BP:      40.1 ml RIGHT VENTRICLE RV Basal diam:  3.70 cm RV Mid diam:    3.00 cm RV S prime:     11.50 cm/s TAPSE (M-mode): 2.9 cm LEFT ATRIUM             Index        RIGHT ATRIUM           Index LA diam:        3.70 cm 1.73 cm/m   RA Area:     21.90 cm LA Vol (A2C):   49.8 ml 23.25 ml/m  RA Volume:   62.10 ml  28.99 ml/m LA Vol (A4C):   54.1 ml 25.26 ml/m LA Biplane Vol: 52.8 ml 24.65 ml/m  AORTIC VALVE                    PULMONIC VALVE AV Area (Vmax):    3.33 cm     PV Vmax:       1.15 m/s AV Area (Vmean):   3.21 cm     PV Peak grad:  5.3 mmHg AV Area (VTI):     2.98 cm AV Vmax:           125.00 cm/s AV Vmean:          79.700 cm/s AV VTI:            0.305 m AV Peak Grad:      6.2 mmHg AV Mean Grad:      3.0 mmHg LVOT Vmax:         120.00 cm/s LVOT Vmean:        73.900 cm/s LVOT VTI:          0.262 m LVOT/AV VTI ratio: 0.86  AORTA Ao Root diam: 3.40 cm MITRAL VALVE               TRICUSPID VALVE MV Area (PHT): 4.19 cm    TR Peak grad:    25.0 mmHg MV Area VTI:   2.80 cm    TR Vmax:        250.00 cm/s MV Peak grad:  3.9 mmHg MV Mean grad:  1.0 mmHg    SHUNTS MV Vmax:       0.99 m/s    Systemic VTI:  0.26 m MV Vmean:      50.6 cm/s   Systemic Diam: 2.10 cm MV Decel Time: 181 msec MV E velocity: 81.00 cm/s MV A velocity: 95.10 cm/s MV E/A ratio:  0.85 Mary Scientist, physiological signed by Phineas Inches Signature Date/Time: 05/14/2022/10:32:28 AM    Final    CT HEAD WO CONTRAST  Result Date: 05/13/2022 CLINICAL DATA:  Pain after fall. EXAM: CT HEAD WITHOUT  CONTRAST CT CERVICAL SPINE WITHOUT CONTRAST TECHNIQUE: Multidetector CT imaging of the head and cervical spine was performed following the standard protocol without intravenous contrast. Multiplanar CT image reconstructions of the cervical spine were also generated. RADIATION DOSE REDUCTION: This exam was performed according to the departmental dose-optimization program which includes automated exposure control, adjustment of the mA and/or kV according to patient size and/or use of iterative reconstruction technique. COMPARISON:  CT scan of the brain and cervical spine October 01, 2009 FINDINGS: CT HEAD FINDINGS Brain: No subdural, epidural, or subarachnoid hemorrhage identified. 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 Ventricles and sulci are again prominent consistent with atrophy, stable. The left cerebral hemisphere is smaller than the right, stable. Cerebellum and brainstem are normal. Basal cisterns are normal. No mass effect or midline shift. Scattered white matter changes. No acute cortical ischemia or infarct. Vascular: No hyperdense vessel or unexpected calcification. Skull: Normal. Negative for fracture or focal lesion. Sinuses/Orbits: No acute finding. Other: A laceration and small hematoma are identified in the posterior scalp. CT CERVICAL SPINE FINDINGS Alignment: Trace anterolisthesis of C7 versus T1 is stable since 2010. Otherwise, straightening of normal lordosis but no  other malalignment. Skull base and vertebrae: No acute fracture. No primary bone lesion or focal pathologic process. Soft tissues and spinal canal: No prevertebral fluid or swelling. No visible canal hematoma. Disc levels: Multilevel degenerative disc disease and facet degenerative changes. Upper chest: Negative. Other: No other abnormalities. IMPRESSION: 1. No acute intracranial abnormalities. Ventricular and sulcal prominence is stable as is a genesis of the corpus callosum. Scattered white matter changes. 2. Laceration and small hematoma over the posterior scalp. 3. No fracture or acute malalignment in the cervical spine. Degenerative changes. Electronically Signed   By: Dorise Bullion III M.D.   On: 05/13/2022 16:30   CT Cervical Spine Wo Contrast  Result Date: 05/13/2022 CLINICAL DATA:  Pain after fall. EXAM: CT HEAD WITHOUT CONTRAST CT CERVICAL SPINE WITHOUT CONTRAST TECHNIQUE: Multidetector CT imaging of the head and cervical spine was performed following the standard protocol without intravenous contrast. Multiplanar CT image reconstructions of the cervical spine were also generated. RADIATION DOSE REDUCTION: This exam was performed according to the departmental dose-optimization program which includes automated exposure control, adjustment of the mA and/or kV according to patient size and/or use of iterative reconstruction technique. COMPARISON:  CT scan of the brain and cervical spine October 01, 2009 FINDINGS: CT HEAD FINDINGS Brain: No subdural, epidural, or subarachnoid hemorrhage identified. 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 Ventricles and sulci are again prominent consistent with atrophy, stable. The left cerebral hemisphere is smaller than the right, stable. Cerebellum and brainstem are normal. Basal cisterns are normal. No mass effect or midline shift. Scattered white matter changes. No acute cortical ischemia or infarct. Vascular: No hyperdense vessel or unexpected  calcification. Skull: Normal. Negative for fracture or focal lesion. Sinuses/Orbits: No acute finding. Other: A laceration and small hematoma are identified in the posterior scalp. CT CERVICAL SPINE FINDINGS Alignment: Trace anterolisthesis of C7 versus T1 is stable since 2010. Otherwise, straightening of normal lordosis but no other malalignment. Skull base and vertebrae: No acute fracture. No primary bone lesion or focal pathologic process. Soft tissues and spinal canal: No prevertebral fluid or swelling. No visible canal hematoma. Disc levels: Multilevel degenerative disc disease and facet degenerative changes. Upper chest: Negative. Other: No other abnormalities. IMPRESSION: 1. No acute intracranial abnormalities. Ventricular and sulcal prominence is stable as is a genesis of the corpus callosum. Scattered  white matter changes. 2. Laceration and small hematoma over the posterior scalp. 3. No fracture or acute malalignment in the cervical spine. Degenerative changes. Electronically Signed   By: Dorise Bullion III M.D.   On: 05/13/2022 16:30   DG Chest 1 View  Result Date: 05/13/2022 CLINICAL DATA:  Fall EXAM: CHEST  1 VIEW COMPARISON:  01/19/2021 FINDINGS: The heart size and mediastinal contours are within normal limits. Aortic atherosclerosis. Low lung volumes. No focal airspace consolidation, pleural effusion, or pneumothorax. The visualized skeletal structures are unremarkable. IMPRESSION: No active disease. Electronically Signed   By: Davina Poke D.O.   On: 05/13/2022 16:29    Microbiology: Results for orders placed or performed in visit on 01/22/22  Microscopic Examination     Status: Abnormal   Collection Time: 01/22/22  1:43 PM   Urine  Result Value Ref Range Status   WBC, UA >30 (A) 0 - 5 /hpf Final   RBC, Urine 0-2 0 - 2 /hpf Final   Epithelial Cells (non renal) 0-10 0 - 10 /hpf Final   Renal Epithel, UA None seen None seen /hpf Final   Bacteria, UA None seen None seen/Few Final     Labs: CBC: Recent Labs  Lab 05/13/22 1541 05/14/22 0508  WBC 8.3 8.7  HGB 15.7 14.8  HCT 46.1 43.6  MCV 94.5 96.0  PLT 180 628   Basic Metabolic Panel: Recent Labs  Lab 05/13/22 1541 05/14/22 0508  NA 137 139  K 3.6 3.5  CL 100 104  CO2 26 25  GLUCOSE 155* 100*  BUN 21 20  CREATININE 1.58* 1.12  CALCIUM 8.8* 8.3*  MG  --  2.1  PHOS  --  3.7   Liver Function Tests: Recent Labs  Lab 05/14/22 0508  AST 17  ALT 22  ALKPHOS 40  BILITOT 0.7  PROT 6.0*  ALBUMIN 3.4*   CBG: Recent Labs  Lab 05/13/22 1639  GLUCAP 161*    Discharge time spent: greater than 30 minutes.  Signed: Orson Eva, MD [facility] 05/14/2022

## 2022-05-14 NOTE — Hospital Course (Addendum)
88 year old male with a history of BPH, hypertension, prostate cancer status post cryoablation presented due to near syncopal episode.  The patient states that after he helped with some groceries in his church pant[employer] he went home and ate dinner.  He went back to the store to pick up more groceries for his pantry.  While transferring the groceries the patient had a transitory vision change in his left eye (patient had recent left corneal transplant).  The patient sat down to rest and the next thing he remembered was waking up on the floor with a pool of blood around his head.  He complained of dull headache on the back of his head after the syncopal episode.  Patient states that he had not been drinking much fluid due to his history of urge incontinence.  The patient is a difficult historian.  History is supplemented by the patient's daughter.  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  Since then, the patient has had transient visual disturbances in his left eye although it is unclear how long they usual last.  He has not had any syncopal episodes previously with transient visual disturbances.  He has no other aural type symptoms.  The patient takes as needed furosemide for lower extremity edema.  He states that he has been taking it more frequently in the past week to 2 weeks.  However, he denies any dizziness, palpitations, chest pain, shortness of breath.  He has not had any other new medications. Apparently, the patient has a history of prostate cancer with resultant urge incontinence.  He tries to avoid drinking much fluid as result of his incontinence.  The patient had a similar episode of syncope 3 to 4 years ago.  He had a stress test in 07/02/2019 which was low risk with EF 69%.  His echocardiogram showed EF 65-70% with no WMA and no major valvular abnormalities.  In the ED, the patient was afebrile hemodynamically stable.  He was noted to have a scalp  laceration that was repaired.  WBC 8.3, hemoglobin 15.7, platelets 1-80,000.  Sodium 137, potassium 3.6, bicarbonate 26, serum creatinine 1.58.

## 2022-05-14 NOTE — Procedures (Signed)
Patient Name: Peyson Postema  MRN: [id number]  Epilepsy Attending: Lora Havens  Referring Physician/Provider: Orson Eva, MD  Date: 05/14/2022 Duration: 22.26 mins  Patient history: 88 year old male who presented with transient loss of consciousness. EEG to evaluate for seizure  Level of alertness: Awake, asleep  AEDs during EEG study: None  Technical aspects: This EEG study was done with scalp electrodes positioned according to the 10-20 International system of electrode placement. Electrical activity was acquired at a sampling rate of '500Hz'$  and reviewed with a high frequency filter of '70Hz'$  and a low frequency filter of '1Hz'$ . EEG data were recorded continuously and digitally stored.   Description: The posterior dominant rhythm consists of 10 Hz activity of moderate voltage (25-35 uV) seen predominantly in posterior head regions, symmetric and reactive to eye opening and eye closing. Sleep was characterized by vertex waves, sleep spindles (12 to 14 Hz), maximal frontocentral region. Physiologic photic driving was seen during photic stimulation.  Hyperventilation was not performed.     IMPRESSION: This study is within normal limits. No seizures or epileptiform discharges were seen throughout the recording.  Natavia Sublette Barbra Sarks

## 2022-05-14 NOTE — Care Management Obs Status (Signed)
[place] NOTIFICATION   Patient Details  Name: Tracy Pratt MRN: [id number] Date of Birth: 1931/07/29   Medicare Observation Status Notification Given:  Yes    Tommy Medal 05/14/2022, 4:49 PM

## 2022-05-14 NOTE — Assessment & Plan Note (Addendum)
Continue Ditropan outpt follow with urology

## 2022-05-14 NOTE — Consult Note (Signed)
I connected with  Tracy Pratt on 05/14/22 by a video enabled telemedicine application and verified that I am speaking with the correct person using two identifiers.   I discussed the limitations of evaluation and management by telemedicine. The patient expressed understanding and agreed to proceed.  Location of patient: [place] Location of physician: [facility]  Neurology Consultation Reason for Consult: Transient loss of consciousness Referring Physician: Dr Shanon Brow Tat  CC: LOC  History is obtained from: Patient, daughter at bedside, chart review  HPI: Tracy Pratt is a 88 y.o. male with medical history of hypertension, cataract, left eye corneal transplant who presented with an episode of loss of consciousness.  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  He was in and out in the heat and then going back into the store.  Reports having lunch but had not had much fluid.  States he felt like his vision was blurry in both eyes, left more than right (at baseline left is worse than right due to corneal transplant) and felt lightheaded.  Therefore he sat down on a stool. He remembers is waking up on the floor with some blood oozing from his head.  Denies any headache, weakness, tingling, numbness, bowel or bladder incontinence, tongue bite, jerking.  Reports having similar episode in 2018 under similar circumstances.  Denies any history of epilepsy/seizures.  Denies any recent illness, new medications.  ROS: All other systems reviewed and negative except as noted in the HPI.  Past Medical History:  Diagnosis Date   Adrenal adenoma    Left   Arthritis    BPH (benign prostatic hyperplasia)    Cataract 2007, 2009   Bilateral   Colon polyps    Essential hypertension    Heart murmur    HX OF 30 YEARS AGO   Helicobacter pylori gastritis 09/09/2018   Malignant hyperthermia    Prostate cancer ([place]) 08/08/2007   Seed  Implant and XRT   Spondylolisthesis    L5-S1    Family History  Problem Relation Age of Onset   Prostate cancer Brother    Lung disease Brother        lobectomy    Social History:  reports that he has quit smoking. His smoking use included cigarettes. He has a 15.00 pack-year smoking history. He has never used smokeless tobacco. He reports that he does not drink alcohol and does not use drugs.   Medications Prior to Admission  Medication Sig Dispense Refill Last Dose   HYDROcodone-acetaminophen (NORCO) 7.5-325 MG tablet TAKE 1 TABLET EVERY 4 HOURS AS NEEDED FOR PAIN. 30 tablet 0 05/12/2022   levocetirizine (XYZAL) 5 MG tablet Take 5 mg by mouth daily.   05/13/2022   lisinopril-hydrochlorothiazide (PRINZIDE,ZESTORETIC) 10-12.5 MG tablet Take 1 tablet by mouth daily.   05/13/2022   loteprednol (LOTEMAX) 0.5 % ophthalmic suspension Place 1 drop into the right eye at bedtime.   05/12/2022   oxybutynin (DITROPAN-XL) 10 MG 24 hr tablet Take 10 mg by mouth daily.   05/13/2022   Polyethyl Glycol-Propyl Glycol (SYSTANE OP) Apply 1 drop to eye 2 (two) times daily.   05/13/2022   prednisoLONE acetate (PRED FORTE) 1 % ophthalmic suspension Place 1 drop into the left eye 2 (two) times daily.   05/13/2022   valACYclovir (VALTREX) 1000 MG tablet Take 1,000 mg by mouth daily.   05/13/2022   Vibegron (GEMTESA) 75 MG TABS  (Patient not  taking: Reported on 05/13/2022)   Not Taking      Exam: Current vital signs: BP 123/72 (BP Location: Left Arm)   Pulse (!) 52   Temp 98.3 F (36.8 C) (Oral)   Resp 18   Ht 5' 9.5" (1.765 m)   Wt 97.6 kg   SpO2 97%   BMI 31.32 kg/m  Vital signs in last 24 hours: Temp:  [98.2 F (36.8 C)-98.5 F (36.9 C)] 98.3 F (36.8 C) (07/17 1337) Pulse Rate:  [52-83] 52 (07/17 1337) Resp:  [15-20] 18 (07/17 1337) BP: (123-143)/(70-85) 123/72 (07/17 1337) SpO2:  [94 %-98 %] 97 % (07/17 1337) Weight:  [96.8 kg-98.9 kg] 97.6 kg (07/17 0545)   Physical Exam  Constitutional:  Appears well-developed and well-nourished.  Psych: Affect appropriate to situation Eyes: No scleral injection Neuro: AOx3, no aphasia, cranial nerves II to XII appear grossly intact, antigravity strength in all 4 extremities, FTN intact bilaterally, intact to light touch  I have reviewed labs in epic and the results pertinent to this consultation are: CBC:  Recent Labs  Lab 05/13/22 1541 05/14/22 0508  WBC 8.3 8.7  HGB 15.7 14.8  HCT 46.1 43.6  MCV 94.5 96.0  PLT 180 426    Basic Metabolic Panel:  Lab Results  Component Value Date   NA 139 05/14/2022   K 3.5 05/14/2022   CO2 25 05/14/2022   GLUCOSE 100 (H) 05/14/2022   BUN 20 05/14/2022   CREATININE 1.12 05/14/2022   CALCIUM 8.3 (L) 05/14/2022   GFRNONAA >60 05/14/2022   GFRAA >60 07/27/2018   Lipid Panel: No results found for: "LDLCALC" HgbA1c:  Lab Results  Component Value Date   HGBA1C 5.8 07/02/2017   Urine Drug Screen: No results found for: "LABOPIA", "COCAINSCRNUR", "LABBENZ", "AMPHETMU", "THCU", "LABBARB"  Alcohol Level No results found for: "ETH"   I have reviewed the images obtained:  MRI brain without contrast 05/14/2022:  No acute intracranial pathology. Agenesis of the corpus callosum, unchanged.  US carotid bilateral 05/14/2022: Minimal amount of bilateral atherosclerotic plaque, right greater than left, unchanged to slightly progressed compared to the 2018 examination though not resulting in a hemodynamically significant stenosis within either internal carotid artery.  TTE 05/14/2022: Left ventricle ejection fraction 60 to 65%, no regional wall motion abnormality  EKG 05/14/2022: NSR  ASSESSMENT/PLAN: 88 year old male who presented with transient loss of consciousness.  Transient loss of consciousness -Semiology is most likely suggestive of syncope due to dehydration.  Low suspicion for cardiac etiology.  Unlikely epileptic  Recommendations: -We will obtain routine EEG to look for potential  epileptogenicity -Will not start on any antiepileptics unless definite abnormality on EEG -Agree with rest of the work-up per cardiology and hospitalist team -If EEG within normal limits, okay for discharge from neurology standpoint -Discussed plan with patient, daughter at bedside as well as Dr. Carles Collet  Thank you for allowing [place] to participate in the care of this patient. If you have any further questions, please contact  me or neurohospitalist.   Zeb Comfort Epilepsy [facility] neurohospitalist

## 2022-05-14 NOTE — Assessment & Plan Note (Addendum)
Check hemoglobin A1c after dc

## 2022-05-14 NOTE — TOC Progression Note (Signed)
  Transition of Care [facility]) Screening Note   Patient Details  Name: Tracy Pratt Date of Birth: 05-Nov-1930   [facility] of [facility]) CM/SW Contact:    Boneta Lucks, RN Phone Number: 05/14/2022, 12:34 PM    Transition of Care Department [facility]) has reviewed patient and no TOC needs have been identified at this time. We will continue to monitor patient advancement through interdisciplinary progression rounds. If new patient transition needs arise, please place a TOC consult.      Barriers to Discharge: Continued Medical Work up

## 2022-05-14 NOTE — Progress Notes (Signed)
EEG completed, results pending. 

## 2022-05-14 NOTE — Consult Note (Signed)
Cardiology Consultation:   Patient ID: Tracy Pratt MRN: [id number]; DOB: Jun 14, 1931  Admit date: 05/13/2022 Date of Consult: 05/14/2022  PCP:  Tracy Sites, MD   [facility] Providers Cardiologist:  Tracy Lesches, MD        Patient Profile:   Tracy Pratt is a 88 y.o. male with a Pratt of HTN, BPH, prostate cancer and Pratt who is being seen 05/14/2022 for the evaluation of Pratt at the request of Dr. Carles Collet.  History of Present Illness:   Tracy Pratt, EF was 65-70%and normal valves. Cardiac monitor with SR with some SB and rare PVCs. No significant arrhythmias.   Nuc study in 2020 for chest pain was normal. On CTA of chest in 07/2019 he was found to have aortic and coronary artery atherosclerosis.   Since that time has had urge incontinence with his prostate cancer and is s/p cryoablation, Lt corneal transplant   Now admitted with Pratt while sitting on a stool while putting groceries away.  Prior to Pratt he had transitory vision change in his lt eye, not unusual since cornea transplant.  He sat on stool and woke up on floor.  He has laceration to his head.  He was out buying groceries and in the heat.  He does not drink much due to incontinence.  He has not been taking his fluid pill daily for same reason.  No chest pain and no cold or fevers.  No orthostatic BPs done.   EKG:  The EKG was personally reviewed and demonstrates:  SR with PVC and incomplete RBBB and LAD.  No acute changes in ST from 05/2021 and PVCs then as well. Telemetry:  Telemetry was personally reviewed and demonstrates:  SR  PCXR NAD.  Hs Troponin 5 & 5 Today Na 139, K+ 3.5 Cr 1.12 down from 1.58  Hgb 14.8 WBC 8.7 plts 176 Mg+ 2.1, Phos 3.7 BP on arrival 124/70 P 81 afebrile today BP 125/85 P 64-59 No awareness of palpitations or rapid HR.  Today feels well.  Mild head soreness from laceration.    Past Medical History:  Diagnosis Date   Adrenal  adenoma    Left   Arthritis    BPH (benign prostatic hyperplasia)    Cataract 2007, 2009   Bilateral   Colon polyps    Essential hypertension    Heart murmur    Pratt OF 30 YEARS AGO   Helicobacter pylori gastritis 09/09/2018   Malignant hyperthermia    Prostate cancer ([place]) 08/08/2007   Seed Implant and XRT   Spondylolisthesis    L5-S1    Past Surgical History:  Procedure Laterality Date   [place]   BIOPSY  08/25/2018   Procedure: BIOPSY;  Surgeon: Gatha Mayer, MD;  Location: [facility];  Service: Endoscopy;;   CATARACT EXTRACTION  2007   COLONOSCOPY  2012   [facility]: single diminutive hyperplastic polyp in rectum   CORNEAL TRANSPLANT  2007   CRYOABLATION N/A 06/09/2021   Procedure: CRYO ABLATION PROSTATE;  Surgeon: Cleon Gustin, MD;  Location: [facility] ORS;  Service: Urology;  Laterality: N/A;   ESOPHAGOGASTRODUODENOSCOPY (EGD) WITH PROPOFOL N/A 08/25/2018   Empiric dilation of esophagus, erythematous mucosa in antrum s/p biopsy, normal duodenum. Clotest positive.   KNEE SURGERY  1996   left knee arthoscopic   MALONEY DILATION  08/25/2018   Procedure: MALONEY DILATION;  Surgeon: Carlean Purl,  Ofilia Neas, MD;  Location: [facility];  Service: Endoscopy;;   ROTATOR CUFF REPAIR  2015   Torn meniscus left knee  1996   TRANSURETHRAL RESECTION OF PROSTATE  1997     Home Medications:  Prior to Admission medications   Medication Sig Start Date End Date Taking? Authorizing Provider  HYDROcodone-acetaminophen (NORCO) 7.5-325 MG tablet TAKE 1 TABLET EVERY 4 HOURS AS NEEDED FOR PAIN. 03/21/22  Yes Carole Civil, MD  levocetirizine (XYZAL) 5 MG tablet Take 5 mg by mouth daily. 02/14/21  Yes [provider]  lisinopril-hydrochlorothiazide (PRINZIDE,ZESTORETIC) 10-12.5 MG tablet Take 1 tablet by mouth daily.   Yes [provider]  loteprednol (LOTEMAX) 0.5 % ophthalmic suspension Place 1 drop into the right eye at bedtime.   Yes  [provider]  oxybutynin (DITROPAN-XL) 10 MG 24 hr tablet Take 10 mg by mouth daily. 04/21/22  Yes [provider]  Polyethyl Glycol-Propyl Glycol (SYSTANE OP) Apply 1 drop to eye 2 (two) times daily.   Yes [provider]  prednisoLONE acetate (PRED FORTE) 1 % ophthalmic suspension Place 1 drop into the left eye 2 (two) times daily. 01/18/21  Yes [provider]  valACYclovir (VALTREX) 1000 MG tablet Take 1,000 mg by mouth daily. 04/28/22  Yes [provider]  Vibegron (GEMTESA) 75 MG TABS  01/12/22   [provider]    Inpatient Medications: Scheduled Meds:  lisinopril  10 mg Oral Daily   loteprednol  1 drop Right Eye QHS   oxybutynin  10 mg Oral Daily   prednisoLONE acetate  1 drop Left Eye BID   valACYclovir  1,000 mg Oral Daily   Continuous Infusions:  PRN Meds: acetaminophen **OR** acetaminophen, HYDROcodone-acetaminophen  Allergies:    Allergies  Allergen Reactions   Diclofenac Sodium Anaphylaxis   Nabumetone Anaphylaxis   Nsaids Anaphylaxis   Tolmetin Anaphylaxis   Bee Venom Swelling   Desflurane Other (See Comments)    Malignant hyperthermia   Enflurane Other (See Comments)    Malignant hyperthermia   Halothane Other (See Comments)    Malignant hyperthermia   Isoflurane Other (See Comments)    Malignant hyperthermia   Sevoflurane Other (See Comments)    Malignant hyperthermia   Succinylcholine Other (See Comments)    Malignant hyperthermia    Social History:   Social History   Socioeconomic History   Marital status: Widowed    Spouse name: Not on file   Number of children: 2   Years of education: Not on file   Highest education level: Not on file  Occupational History   Occupation: Furniture conservator/restorer  Tobacco Use   Smoking status: Former    Packs/day: 1.00    Years: 15.00    Total pack years: 15.00    Types: Cigarettes   Smokeless tobacco: Never   Tobacco comments:    quit 28 years ago  Vaping Use    Vaping Use: Never used  Substance and Sexual Activity   Alcohol use: No   Drug use: No   Sexual activity: Not Currently  Other Topics Concern   Not on file  Social History Narrative   Widowed   Daughter Tracy Crouch PA-C [facility] [employer]   Retired Geologist, engineering   2 daughters   4 caffeine/day   Prior smoker, no EtOH/drugs   Social Determinants of Radio broadcast assistant Strain: Not on [employer] Insecurity: Not on file  Transportation Needs: Not on file  Physical Activity: Not on file  Stress: Not on file  Social Connections: Not on file  Intimate Partner Violence: Not on file    Family History:    Family History  Problem Relation Age of Onset   Prostate cancer Brother    Lung disease Brother        lobectomy     ROS:  Please see the history of present illness.  General:no colds or fevers, no weight changes Skin:no rashes or ulcers HEENT:no blurred vision, no congestion CV:see HPI PUL:see HPI GI:no diarrhea constipation or melena, no indigestion GU:no hematuria, no dysuria MS:no joint pain, no claudication Neuro:no Pratt, no lightheadedness Endo:no diabetes, no thyroid disease  All other ROS reviewed and negative.     Physical Exam/Data:   Vitals:   05/14/22 0130 05/14/22 0401 05/14/22 0429 05/14/22 0545  BP: 129/72  125/85   Pulse: 64  (!) 59   Resp: 18  18   Temp: 98.2 F (36.8 C)  98.3 F (36.8 C)   TempSrc: Oral  Oral   SpO2: 96%  96%   Weight:  96.8 kg  97.6 kg  Height:        Intake/Output Summary (Last 24 hours) at 05/14/2022 0824 Last data filed at 05/13/2022 2212 Gross per 24 hour  Intake 500 ml  Output 2 ml  Net 498 ml      05/14/2022    5:45 AM 05/14/2022    4:01 AM 05/13/2022    8:35 PM  Last 3 Weights  Weight (lbs) 215 lb 2.7 oz 213 lb 4.8 oz 213 lb 4.8 oz  Weight (kg) 97.6 kg 96.752 kg 96.752 kg     Body mass index is 31.32 kg/m.  General:  Well nourished, well developed, in no acute distress HEENT: normal,  though dried blood in his hair and laceration, mild swelling. Neck: no JVD Vascular: No carotid bruits; Distal pulses 2+ bilaterally Cardiac:  normal S1, S2; RRR; no murmur  Lungs:  clear to auscultation bilaterally, no wheezing, rhonchi or rales  Abd: soft, nontender, no hepatomegaly  Ext: no edema Musculoskeletal:  No deformities, BUE and BLE strength normal and equal Skin: warm and dry  Neuro:  alert and oriented X 3 MAE follows commands, no focal abnormalities noted Psych:  Normal affect    Relevant CV Studies:  Echo today pending and carotid dopplers.  Nuc study 06/2019  Narrative & Impression  There was no ST segment deviation noted during stress. RBBB seen throughout study. No T wave inversion was noted during stress. The study is normal. This is a low risk study. Nuclear stress EF: 69%.   Echo 2018  Study Conclusions   - Left ventricle: The cavity size was normal. Wall thickness was at    the upper limits of normal. Systolic function was vigorous. The    estimated ejection fraction was in the range of 65% to 70%. Wall    motion was normal; there were no regional wall motion    abnormalities. Doppler parameters are consistent with abnormal    left ventricular relaxation (grade 1 diastolic dysfunction).  - Aortic valve: Mildly calcified annulus. Trileaflet. There was no    stenosis. Mean gradient (S): 6 mm Hg. Peak gradient (S): 14 mm    Hg. Valve area (VTI): 2.99 cm^2.  - Mitral valve: There was trivial regurgitation.  - Right atrium: Central venous pressure (est): 3 mm Hg.  - Atrial septum: No defect or patent foramen ovale was identified.  - Tricuspid valve: There was trivial regurgitation.  -  Pulmonary arteries: PA peak pressure: 25 mm Hg (S).  - Pericardium, extracardiac: There was no pericardial effusion.   Impressions:   - Upper normal LV wall thickness with LVEF 65-70% and grade 1    diastolic dysfunction. Trivial mitral regurgitation. Mildly    sclerotic  aortic valve without stenosis. Trivial tricuspid    regurgitation with normal estimated PASP 25 mmHg.   Laboratory Data:  High Sensitivity Troponin:   Recent Labs  Lab 05/13/22 1541 05/13/22 1834  TROPONINIHS 5 5     Chemistry Recent Labs  Lab 05/13/22 1541 05/14/22 0508  NA 137 139  K 3.6 3.5  CL 100 104  CO2 26 25  GLUCOSE 155* 100*  BUN 21 20  CREATININE 1.58* 1.12  CALCIUM 8.8* 8.3*  MG  --  2.1  GFRNONAA 42* >60  ANIONGAP 11 10    Recent Labs  Lab 05/14/22 0508  PROT 6.0*  ALBUMIN 3.4*  AST 17  ALT 22  ALKPHOS 40  BILITOT 0.7   Lipids No results for input(s): "CHOL", "TRIG", "HDL", "LABVLDL", "LDLCALC", "CHOLHDL" in the last 168 hours.  Hematology Recent Labs  Lab 05/13/22 1541 05/14/22 0508  WBC 8.3 8.7  RBC 4.88 4.54  HGB 15.7 14.8  HCT 46.1 43.6  MCV 94.5 96.0  MCH 32.2 32.6  MCHC 34.1 33.9  RDW 12.7 12.9  PLT 180 176   Thyroid No results for input(s): "TSH", "FREET4" in the last 168 hours.  BNPNo results for input(s): "BNP", "PROBNP" in the last 168 hours.  DDimer No results for input(s): "DDIMER" in the last 168 hours.   Radiology/Studies:  CT HEAD WO CONTRAST  Result Date: 05/13/2022 CLINICAL DATA:  Pain after fall. EXAM: CT HEAD WITHOUT CONTRAST CT CERVICAL SPINE WITHOUT CONTRAST TECHNIQUE: Multidetector CT imaging of the head and cervical spine was performed following the standard protocol without intravenous contrast. Multiplanar CT image reconstructions of the cervical spine were also generated. RADIATION DOSE REDUCTION: This exam was performed according to the departmental dose-optimization program which includes automated exposure control, adjustment of the mA and/or kV according to patient size and/or use of iterative reconstruction technique. COMPARISON:  CT scan of the brain and cervical spine October 01, 2009 FINDINGS: CT HEAD FINDINGS Brain: No subdural, epidural, or subarachnoid hemorrhage identified. 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 Ventricles and sulci are again prominent consistent with atrophy, stable. The left cerebral hemisphere is smaller than the right, stable. Cerebellum and brainstem are normal. Basal cisterns are normal. No mass effect or midline shift. Scattered white matter changes. No acute cortical ischemia or infarct. Vascular: No hyperdense vessel or unexpected calcification. Skull: Normal. Negative for fracture or focal lesion. Sinuses/Orbits: No acute finding. Other: A laceration and small hematoma are identified in the posterior scalp. CT CERVICAL SPINE FINDINGS Alignment: Trace anterolisthesis of C7 versus T1 is stable since 2010. Otherwise, straightening of normal lordosis but no other malalignment. Skull base and vertebrae: No acute fracture. No primary bone lesion or focal pathologic process. Soft tissues and spinal canal: No prevertebral fluid or swelling. No visible canal hematoma. Disc levels: Multilevel degenerative disc disease and facet degenerative changes. Upper chest: Negative. Other: No other abnormalities. IMPRESSION: 1. No acute intracranial abnormalities. Ventricular and sulcal prominence is stable as is a genesis of the corpus callosum. Scattered white matter changes. 2. Laceration and small hematoma over the posterior scalp. 3. No fracture or acute malalignment in the cervical spine. Degenerative changes. Electronically Signed   By: Dorise Bullion III  M.D.   On: 05/13/2022 16:30   CT Cervical Spine Wo Contrast  Result Date: 05/13/2022 CLINICAL DATA:  Pain after fall. EXAM: CT HEAD WITHOUT CONTRAST CT CERVICAL SPINE WITHOUT CONTRAST TECHNIQUE: Multidetector CT imaging of the head and cervical spine was performed following the standard protocol without intravenous contrast. Multiplanar CT image reconstructions of the cervical spine were also generated. RADIATION DOSE REDUCTION: This exam was performed according to the departmental dose-optimization program which includes  automated exposure control, adjustment of the mA and/or kV according to patient size and/or use of iterative reconstruction technique. COMPARISON:  CT scan of the brain and cervical spine October 01, 2009 FINDINGS: CT HEAD FINDINGS Brain: No subdural, epidural, or subarachnoid hemorrhage identified. 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 Ventricles and sulci are again prominent consistent with atrophy, stable. The left cerebral hemisphere is smaller than the right, stable. Cerebellum and brainstem are normal. Basal cisterns are normal. No mass effect or midline shift. Scattered white matter changes. No acute cortical ischemia or infarct. Vascular: No hyperdense vessel or unexpected calcification. Skull: Normal. Negative for fracture or focal lesion. Sinuses/Orbits: No acute finding. Other: A laceration and small hematoma are identified in the posterior scalp. CT CERVICAL SPINE FINDINGS Alignment: Trace anterolisthesis of C7 versus T1 is stable since 2010. Otherwise, straightening of normal lordosis but no other malalignment. Skull base and vertebrae: No acute fracture. No primary bone lesion or focal pathologic process. Soft tissues and spinal canal: No prevertebral fluid or swelling. No visible canal hematoma. Disc levels: Multilevel degenerative disc disease and facet degenerative changes. Upper chest: Negative. Other: No other abnormalities. IMPRESSION: 1. No acute intracranial abnormalities. Ventricular and sulcal prominence is stable as is a genesis of the corpus callosum. Scattered white matter changes. 2. Laceration and small hematoma over the posterior scalp. 3. No fracture or acute malalignment in the cervical spine. Degenerative changes. Electronically Signed   By: Dorise Bullion III M.D.   On: 05/13/2022 16:30   DG Chest 1 View  Result Date: 05/13/2022 CLINICAL DATA:  Fall EXAM: CHEST  1 VIEW COMPARISON:  01/19/2021 FINDINGS: The heart size and mediastinal contours are within normal  limits. Aortic atherosclerosis. Low lung volumes. No focal airspace consolidation, pleural effusion, or pneumothorax. The visualized skeletal structures are unremarkable. IMPRESSION: No active disease. Electronically Signed   By: Davina Poke D.O.   On: 05/13/2022 16:29     Assessment and Plan:   Pratt with decreased fluid intake.  Will check orthostatic VS.  Cr slightly elevated on arrival. Echo and carotid dopplers pending.  Was hydrated overnight with improved Cr.   On prior CTA of chest in 2020 he had coronary calcifications scattered and neg stress test , no chest pain and no SOB.  Neg troponins HTN controlled- lisinopril HCTZ on hold.  It may be prudent to change to another medication.  Will see what echo reveals.  AKI resolved Scalp laceration per IM and repaired in ED  Pratt of prostate cancer and now urge incontinence.    Risk Assessment/Risk Scores:                For questions or updates, please contact [place] Please consult [email or web address] for contact info under    Signed, Cecilie Kicks, NP  05/14/2022 8:24 AM

## 2022-05-14 NOTE — Assessment & Plan Note (Addendum)
Holding lisinopril HCTZ temporarily>>restart after d/c BP controlled presently

## 2022-05-15 LAB — URINE CULTURE

## 2022-05-25 DIAGNOSIS — N3941 Urge incontinence: Secondary | ICD-10-CM | POA: Diagnosis not present

## 2022-05-28 DIAGNOSIS — R600 Localized edema: Secondary | ICD-10-CM | POA: Diagnosis not present

## 2022-05-28 DIAGNOSIS — R946 Abnormal results of thyroid function studies: Secondary | ICD-10-CM | POA: Diagnosis not present

## 2022-05-28 DIAGNOSIS — R7309 Other abnormal glucose: Secondary | ICD-10-CM | POA: Diagnosis not present

## 2022-05-28 DIAGNOSIS — E6609 Other obesity due to excess calories: Secondary | ICD-10-CM | POA: Diagnosis not present

## 2022-05-28 DIAGNOSIS — Z6830 Body mass index (BMI) 30.0-30.9, adult: Secondary | ICD-10-CM | POA: Diagnosis not present

## 2022-05-28 DIAGNOSIS — Z0001 Encounter for general adult medical examination with abnormal findings: Secondary | ICD-10-CM | POA: Diagnosis not present

## 2022-05-28 DIAGNOSIS — Z1331 Encounter for screening for depression: Secondary | ICD-10-CM | POA: Diagnosis not present

## 2022-05-28 DIAGNOSIS — C61 Malignant neoplasm of prostate: Secondary | ICD-10-CM | POA: Diagnosis not present

## 2022-05-28 DIAGNOSIS — I10 Essential (primary) hypertension: Secondary | ICD-10-CM | POA: Diagnosis not present

## 2022-05-28 DIAGNOSIS — E559 Vitamin D deficiency, unspecified: Secondary | ICD-10-CM | POA: Diagnosis not present

## 2022-05-28 DIAGNOSIS — E785 Hyperlipidemia, unspecified: Secondary | ICD-10-CM | POA: Diagnosis not present

## 2022-06-01 DIAGNOSIS — N3941 Urge incontinence: Secondary | ICD-10-CM | POA: Diagnosis not present

## 2022-06-07 DIAGNOSIS — N3941 Urge incontinence: Secondary | ICD-10-CM | POA: Diagnosis not present

## 2022-06-15 DIAGNOSIS — R350 Frequency of micturition: Secondary | ICD-10-CM | POA: Diagnosis not present

## 2022-06-29 DIAGNOSIS — Z6831 Body mass index (BMI) 31.0-31.9, adult: Secondary | ICD-10-CM | POA: Diagnosis not present

## 2022-06-29 DIAGNOSIS — E6609 Other obesity due to excess calories: Secondary | ICD-10-CM | POA: Diagnosis not present

## 2022-06-29 DIAGNOSIS — J069 Acute upper respiratory infection, unspecified: Secondary | ICD-10-CM | POA: Diagnosis not present

## 2022-07-12 DIAGNOSIS — R350 Frequency of micturition: Secondary | ICD-10-CM | POA: Diagnosis not present

## 2022-07-12 DIAGNOSIS — N3941 Urge incontinence: Secondary | ICD-10-CM | POA: Diagnosis not present

## 2022-08-10 DIAGNOSIS — N3941 Urge incontinence: Secondary | ICD-10-CM | POA: Diagnosis not present

## 2022-08-15 ENCOUNTER — Ambulatory Visit (INDEPENDENT_AMBULATORY_CARE_PROVIDER_SITE_OTHER): Payer: Medicare Other

## 2022-08-15 ENCOUNTER — Ambulatory Visit (INDEPENDENT_AMBULATORY_CARE_PROVIDER_SITE_OTHER): Payer: Medicare Other | Admitting: Orthopaedic Surgery

## 2022-08-15 ENCOUNTER — Encounter: Payer: Self-pay | Admitting: Orthopaedic Surgery

## 2022-08-15 DIAGNOSIS — M25562 Pain in left knee: Secondary | ICD-10-CM

## 2022-08-15 DIAGNOSIS — G8929 Other chronic pain: Secondary | ICD-10-CM

## 2022-08-15 NOTE — Progress Notes (Signed)
My left knee hurts.  A couple of months ago he hurt his left knee while putting on a compression hose.  He was standing and almost fell.  He twisted his left knee.  The left knee has gotten better but still bothers him at times.  It is no longer swelling.  He has no giving way.  He has come popping.  He is walking much better but is concerned it still hurts now and then.  His retained fluids in the lower legs have gotten better with change of diuretics.  Left knee has no effusion, slight crepitus, stable, near full ROM, some medial tenderness, NV intact.  No distal edema today.  X-rays were done of the left knee, reported separately.  Encounter Diagnosis  Name Primary?   Chronic pain of left knee Yes   I have discussed his pain and findings.  I will hold off any injection today.  He is doing well.  I will see him as needed.  Call if needed.  Call if any problem.  Precautions discussed.  Electronically Signed Sanjuana Kava, MD 10/18/202310:52 AM

## 2022-08-16 ENCOUNTER — Telehealth: Payer: Self-pay

## 2022-08-16 ENCOUNTER — Telehealth: Payer: Self-pay | Admitting: Orthopaedic Surgery

## 2022-08-16 MED ORDER — HYDROCODONE-ACETAMINOPHEN 7.5-325 MG PO TABS: ORAL_TABLET | ORAL | 0 refills | Status: DC

## 2022-08-16 NOTE — Telephone Encounter (Signed)
Call received following office visit 08/15/22; patient states had understood a prescription was to be sent to [facility], and said not anything has been received at pharmacy. Please advise.

## 2022-08-24 DIAGNOSIS — Z947 Corneal transplant status: Secondary | ICD-10-CM | POA: Diagnosis not present

## 2022-09-07 NOTE — Telephone Encounter (Signed)
done

## 2022-09-13 ENCOUNTER — Ambulatory Visit (INDEPENDENT_AMBULATORY_CARE_PROVIDER_SITE_OTHER): Payer: Medicare Other | Admitting: Orthopaedic Surgery

## 2022-09-13 ENCOUNTER — Encounter: Payer: Self-pay | Admitting: Orthopaedic Surgery

## 2022-09-13 VITALS — BP 136/74 | HR 66 | Ht 69.5 in | Wt 215.0 lb

## 2022-09-13 DIAGNOSIS — G8929 Other chronic pain: Secondary | ICD-10-CM

## 2022-09-13 DIAGNOSIS — M25512 Pain in left shoulder: Secondary | ICD-10-CM | POA: Diagnosis not present

## 2022-09-13 MED ORDER — METHYLPREDNISOLONE ACETATE 40 MG/ML IJ SUSP
40.0000 mg | Freq: Once | INTRAMUSCULAR | Status: AC
  Administered 2022-09-13: 40 mg via INTRA_ARTICULAR

## 2022-09-13 MED ORDER — HYDROCODONE-ACETAMINOPHEN 7.5-325 MG PO TABS: ORAL_TABLET | ORAL | 0 refills | Status: DC

## 2022-09-13 NOTE — Progress Notes (Signed)
My shoulder hurts bad.  He was putting up a blind on his window when his left shoulder started hurting about three days ago.  It continues to hurt.  He has taken pain medicine, used heat and ice.  He has no swelling, no numbness.  ROM of the left shoulder is full but painful in the extremes.  NV intact.  ROM of neck is full.  Encounter Diagnosis  Name Primary?   Chronic left shoulder pain Yes   PROCEDURE NOTE:  The patient request injection, verbal consent was obtained.  The left shoulder was prepped appropriately after time out was performed.   Sterile technique was observed and injection of 1 cc of DepoMedrol '40mg'$  with several cc's of plain xylocaine. Anesthesia was provided by ethyl chloride and a 20-gauge needle was used to inject the shoulder area. A posterior approach was used.  The injection was tolerated well.  A band aid dressing was applied.  The patient was advised to apply ice later today and tomorrow to the injection sight as needed.  I will refill pain medicine.  I have reviewed the [place] web site prior to prescribing narcotic medicine for this patient.  Return in two weeks.  Call if any problem.  Precautions discussed.  Electronically Signed Sanjuana Kava, MD 11/16/20239:41 AM

## 2022-09-14 DIAGNOSIS — R350 Frequency of micturition: Secondary | ICD-10-CM | POA: Diagnosis not present

## 2022-09-14 DIAGNOSIS — N3941 Urge incontinence: Secondary | ICD-10-CM | POA: Diagnosis not present

## 2022-09-18 DIAGNOSIS — Z23 Encounter for immunization: Secondary | ICD-10-CM | POA: Diagnosis not present

## 2022-09-27 ENCOUNTER — Ambulatory Visit (INDEPENDENT_AMBULATORY_CARE_PROVIDER_SITE_OTHER): Payer: Medicare Other | Admitting: Orthopaedic Surgery

## 2022-09-27 ENCOUNTER — Encounter: Payer: Self-pay | Admitting: Orthopaedic Surgery

## 2022-09-27 VITALS — BP 131/63 | HR 60 | Ht 69.5 in | Wt 211.0 lb

## 2022-09-27 DIAGNOSIS — G8929 Other chronic pain: Secondary | ICD-10-CM

## 2022-09-27 DIAGNOSIS — M25512 Pain in left shoulder: Secondary | ICD-10-CM | POA: Diagnosis not present

## 2022-09-27 NOTE — Progress Notes (Signed)
I am much better.  The left shoulder injection really helped him a lot.  He has near full ROM with just slight tenderness of the left shoulder today.  NV intact.  Encounter Diagnosis  Name Primary?   Chronic left shoulder pain Yes   I will see prn.  Call if any problem.  Precautions discussed.  Electronically Signed Sanjuana Kava, MD 11/30/202310:15 AM

## 2022-10-12 DIAGNOSIS — R350 Frequency of micturition: Secondary | ICD-10-CM | POA: Diagnosis not present

## 2022-10-12 DIAGNOSIS — N3941 Urge incontinence: Secondary | ICD-10-CM | POA: Diagnosis not present

## 2022-11-05 DIAGNOSIS — L308 Other specified dermatitis: Secondary | ICD-10-CM | POA: Diagnosis not present

## 2022-11-05 DIAGNOSIS — X32XXXD Exposure to sunlight, subsequent encounter: Secondary | ICD-10-CM | POA: Diagnosis not present

## 2022-11-05 DIAGNOSIS — L570 Actinic keratosis: Secondary | ICD-10-CM | POA: Diagnosis not present

## 2022-11-09 DIAGNOSIS — R350 Frequency of micturition: Secondary | ICD-10-CM | POA: Diagnosis not present

## 2022-12-02 ENCOUNTER — Other Ambulatory Visit: Payer: Self-pay

## 2022-12-02 ENCOUNTER — Emergency Department (HOSPITAL_COMMUNITY)
Admission: EM | Admit: 2022-12-02 | Discharge: 2022-12-02 | Disposition: A | Discharge status: Home / Self Care | Payer: Medicare Other | Attending: Student | Admitting: Student

## 2022-12-02 ENCOUNTER — Encounter (HOSPITAL_COMMUNITY): Payer: Self-pay

## 2022-12-02 DIAGNOSIS — M7989 Other specified soft tissue disorders: Secondary | ICD-10-CM

## 2022-12-02 DIAGNOSIS — R600 Localized edema: Secondary | ICD-10-CM | POA: Diagnosis not present

## 2022-12-02 DIAGNOSIS — R2240 Localized swelling, mass and lump, unspecified lower limb: Secondary | ICD-10-CM | POA: Diagnosis not present

## 2022-12-02 DIAGNOSIS — Z8546 Personal history of malignant neoplasm of prostate: Secondary | ICD-10-CM | POA: Diagnosis not present

## 2022-12-02 DIAGNOSIS — I10 Essential (primary) hypertension: Secondary | ICD-10-CM | POA: Diagnosis not present

## 2022-12-02 DIAGNOSIS — Z87891 Personal history of nicotine dependence: Secondary | ICD-10-CM | POA: Diagnosis not present

## 2022-12-02 DIAGNOSIS — I82402 Acute embolism and thrombosis of unspecified deep veins of left lower extremity: Secondary | ICD-10-CM | POA: Insufficient documentation

## 2022-12-02 DIAGNOSIS — Z79899 Other long term (current) drug therapy: Secondary | ICD-10-CM | POA: Diagnosis not present

## 2022-12-02 LAB — TROPONIN I (HIGH SENSITIVITY)
Troponin I (High Sensitivity): 6 ng/L (ref <18)
Troponin I (High Sensitivity): 7 ng/L (ref <18)

## 2022-12-02 LAB — BRAIN NATRIURETIC PEPTIDE: B Natriuretic Peptide: 52 pg/mL (ref 0.0–100.0)

## 2022-12-02 LAB — COMPREHENSIVE METABOLIC PANEL
ALT: 27 U/L (ref 0–44)
AST: 23 U/L (ref 15–41)
Albumin: 4 g/dL (ref 3.5–5.0)
Alkaline Phosphatase: 58 U/L (ref 38–126)
Anion gap: 10 (ref 5–15)
BUN: 20 mg/dL (ref 8–23)
CO2: 28 mmol/L (ref 22–32)
Calcium: 8.3 mg/dL — ABNORMAL LOW (ref 8.9–10.3)
Chloride: 97 mmol/L — ABNORMAL LOW (ref 98–111)
Creatinine, Ser: 1.36 mg/dL — ABNORMAL HIGH (ref 0.61–1.24)
GFR, Estimated: 49 mL/min — ABNORMAL LOW (ref >60)
Glucose, Bld: 102 mg/dL — ABNORMAL HIGH (ref 70–99)
Potassium: 3.6 mmol/L (ref 3.5–5.1)
Sodium: 135 mmol/L (ref 135–145)
Total Bilirubin: 0.9 mg/dL (ref 0.3–1.2)
Total Protein: 7.5 g/dL (ref 6.5–8.1)

## 2022-12-02 LAB — CBC WITH DIFFERENTIAL/PLATELET
Abs Immature Granulocytes: 0.03 10*3/uL (ref 0.00–0.07)
Basophils Absolute: 0 10*3/uL (ref 0.0–0.1)
Basophils Relative: 0 %
Eosinophils Absolute: 0.2 10*3/uL (ref 0.0–0.5)
Eosinophils Relative: 2 %
HCT: 45.5 % (ref 39.0–52.0)
Hemoglobin: 15.4 g/dL (ref 13.0–17.0)
Immature Granulocytes: 0 %
Lymphocytes Relative: 15 %
Lymphs Abs: 1.3 10*3/uL (ref 0.7–4.0)
MCH: 33.1 pg (ref 26.0–34.0)
MCHC: 33.8 g/dL (ref 30.0–36.0)
MCV: 97.8 fL (ref 80.0–100.0)
Monocytes Absolute: 0.8 10*3/uL (ref 0.1–1.0)
Monocytes Relative: 9 %
Neutro Abs: 6.6 10*3/uL (ref 1.7–7.7)
Neutrophils Relative %: 74 %
Platelets: 189 10*3/uL (ref 150–400)
RBC: 4.65 MIL/uL (ref 4.22–5.81)
RDW: 12.5 % (ref 11.5–15.5)
WBC: 9 10*3/uL (ref 4.0–10.5)
nRBC: 0 % (ref 0.0–0.2)

## 2022-12-02 LAB — D-DIMER, QUANTITATIVE: D-Dimer, Quant: 12.13 ug/mL-FEU — ABNORMAL HIGH (ref 0.00–0.50)

## 2022-12-02 MED ORDER — APIXABAN 5 MG PO TABS
10.0000 mg | ORAL_TABLET | Freq: Once | ORAL | Status: AC
  Administered 2022-12-02: 10 mg via ORAL
  Filled 2022-12-02: qty 2

## 2022-12-02 MED ORDER — APIXABAN (ELIQUIS) VTE STARTER PACK (10MG AND 5MG): ORAL_TABLET | ORAL | 0 refills | Status: DC

## 2022-12-02 MED ORDER — APIXABAN (ELIQUIS) EDUCATION KIT FOR DVT/PE PATIENTS: PACK | Freq: Once | Status: AC

## 2022-12-02 MED ORDER — FUROSEMIDE 10 MG/ML IJ SOLN
40.0000 mg | Freq: Once | INTRAMUSCULAR | Status: AC
  Administered 2022-12-02: 40 mg via INTRAVENOUS
  Filled 2022-12-02: qty 4

## 2022-12-02 NOTE — ED Provider Notes (Addendum)
[facility] Provider Note  CSN: [id number] Arrival date & time: 12/02/22 1710  Chief Complaint(s) Leg Swelling  HPI Tracy Pratt is a 89 y.o. male with PMH HTN, prostate cancer status post cryotherapy and radiation, H. pylori who presents emergency department for evaluation of left lower extremity swelling.  Patient states that he normally has some lower extremity edema bilaterally and he takes Lasix every day for this but he has noticed a significant worsening of asymmetric left-sided lower extremity edema compared to the right of the last 24 hours.  He states that it feels heavy but denies pain to the extremity.  No recent long travel or trauma to the leg.  Denies chest pain, shortness of breath, abdominal pain, nausea, vomiting or other systemic symptoms.   Past Medical History Past Medical History:  Diagnosis Date   Adrenal adenoma    Left   Arthritis    BPH (benign prostatic hyperplasia)    Cataract 2007, 2009   Bilateral   Colon polyps    Essential hypertension    Heart murmur    HX OF 30 YEARS AGO   Helicobacter pylori gastritis 09/09/2018   Malignant hyperthermia    Prostate cancer ([place]) 08/08/2007   Seed Implant and XRT   Spondylolisthesis    L5-S1   Patient Active Problem List   Diagnosis Date Noted   Syncope 05/13/2022   Scalp laceration 05/13/2022   AKI (acute kidney injury) ([place]) 05/13/2022   Hyperglycemia 05/13/2022   Obesity (BMI 30-39.9) 05/13/2022   Urge incontinence 05/13/2022   History of prostate cancer 05/13/2022   Pancreatic lesion 63/14/9702   Helicobacter pylori gastritis 09/09/2018   Parotid swelling 09/08/2018   Esophageal dysphagia    Prediabetes 08/19/2017   Subclinical hypothyroidism 08/19/2017   Adrenal nodule ([place]) 08/19/2017   Hypoglycemia 08/14/2017   Piriformis syndrome of right side 07/29/2017   Spondylolisthesis at L5-S1 level 12/13/2015   Midline low back pain without sciatica  12/13/2015   H/O repair of left rotator cuff 04/19/2014   Pain in joint, shoulder region 03/23/2014   Muscle weakness (generalized) 03/23/2014   Decreased range of motion of left shoulder 03/23/2014   Essential hypertension 02/23/2014   Torn rotator cuff 02/09/2014   Prostate cancer ([place]) 10/31/2011   Spondylolisthesis    BPH (benign prostatic hyperplasia)    Malignant hyperthermia    Adrenal adenoma    Home Medication(s) Prior to Admission medications   Medication Sig Start Date End Date Taking? Authorizing Provider  APIXABAN Arne Cleveland) VTE STARTER PACK ('10MG'$  AND '5MG'$ ) Take as directed on package: start with two-'5mg'$  tablets twice daily for 7 days. On day 8, switch to one-'5mg'$  tablet twice daily. 12/02/22  Yes Deondre Marinaro, MD  furosemide (LASIX) 40 MG tablet Take 40 mg by mouth daily as needed. 05/28/22   [provider]  HYDROcodone-acetaminophen (NORCO) 7.5-325 MG tablet TAKE 1 TABLET EVERY 4 HOURS AS NEEDED FOR PAIN. 09/13/22   Sanjuana Kava, MD  levocetirizine (XYZAL) 5 MG tablet Take 5 mg by mouth daily. 02/14/21   [provider]  lisinopril-hydrochlorothiazide (PRINZIDE,ZESTORETIC) 10-12.5 MG tablet Take 1 tablet by mouth daily.    [provider]  loteprednol (LOTEMAX) 0.5 % ophthalmic suspension Place 1 drop into the right eye at bedtime.    [provider]  oxybutynin (DITROPAN-XL) 10 MG 24 hr tablet Take 10 mg by mouth daily. 04/21/22   [provider]  Polyethyl Glycol-Propyl Glycol (SYSTANE OP) Apply 1 drop  to eye 2 (two) times daily.    [provider]  prednisoLONE acetate (PRED FORTE) 1 % ophthalmic suspension Place 1 drop into the left eye 2 (two) times daily. 01/18/21   [provider]  valACYclovir (VALTREX) 1000 MG tablet Take 1,000 mg by mouth daily. 04/28/22   [provider]  Vitamin D, Ergocalciferol, (DRISDOL) 1.25 MG (50000 UNIT) CAPS capsule Take by mouth. 05/29/22   [provider]                                                                                                                                     Past Surgical History Past Surgical History:  Procedure Laterality Date   [place]   BIOPSY  08/25/2018   Procedure: BIOPSY;  Surgeon: Gatha Mayer, MD;  Location: [facility];  Service: Endoscopy;;   CATARACT EXTRACTION  2007   COLONOSCOPY  2012   [facility]: single diminutive hyperplastic polyp in rectum   CORNEAL TRANSPLANT  2007   CRYOABLATION N/A 06/09/2021   Procedure: CRYO ABLATION PROSTATE;  Surgeon: Cleon Gustin, MD;  Location: [facility];  Service: Urology;  Laterality: N/A;   ESOPHAGOGASTRODUODENOSCOPY (EGD) WITH PROPOFOL N/A 08/25/2018   Empiric dilation of esophagus, erythematous mucosa in antrum s/p biopsy, normal duodenum. Clotest positive.   KNEE SURGERY  1996   left knee arthoscopic   MALONEY DILATION  08/25/2018   Procedure: MALONEY DILATION;  Surgeon: Gatha Mayer, MD;  [facility];  Service: Endoscopy;;   ROTATOR CUFF REPAIR  2015   Torn meniscus left knee  1996   TRANSURETHRAL RESECTION OF PROSTATE  1997   Family History Family History  Problem Relation Age of Onset   Prostate cancer Brother    Lung disease Brother        lobectomy    Social History Social History   Tobacco Use   Smoking status: Former    Packs/day: 1.00    Years: 15.00    Total pack years: 15.00    Types: Cigarettes   Smokeless tobacco: Never   Tobacco comments:    quit 28 years ago  Vaping Use   Vaping Use: Never used  Substance Use Topics   Alcohol use: No   Drug use: No   Allergies Diclofenac sodium, Nabumetone, Nsaids, Tolmetin, Bee venom, Desflurane, Enflurane, Halothane, Isoflurane, Sevoflurane, and Succinylcholine  Review of Systems Review of Systems  Cardiovascular:  Positive for leg swelling.    Physical Exam Vital Signs  I have reviewed the triage vital signs BP 114/72   Pulse 65    Temp 98.1 F (36.7 C) (Oral)   Resp 20   Ht 5' 9.5" (1.765 m)   Wt 95.7 kg   SpO2 98%   BMI 30.71 kg/m   Physical Exam Constitutional:      General: He is not in acute distress.  Appearance: Normal appearance.  HENT:     Head: Normocephalic and atraumatic.     Nose: No congestion or rhinorrhea.  Eyes:     General:        Right eye: No discharge.        Left eye: No discharge.     Extraocular Movements: Extraocular movements intact.     Pupils: Pupils are equal, round, and reactive to light.  Cardiovascular:     Rate and Rhythm: Normal rate and regular rhythm.     Heart sounds: No murmur heard. Pulmonary:     Effort: No respiratory distress.     Breath sounds: No wheezing or rales.  Abdominal:     General: There is no distension.     Tenderness: There is no abdominal tenderness.  Musculoskeletal:        General: Normal range of motion.     Cervical back: Normal range of motion.     Right lower leg: Edema present.     Left lower leg: Edema present.  Skin:    General: Skin is warm and dry.  Neurological:     General: No focal deficit present.     Mental Status: He is alert.     ED Results and Treatments Labs (all labs ordered are listed, but only abnormal results are displayed) Labs Reviewed  COMPREHENSIVE METABOLIC PANEL - Abnormal; Notable for the following components:      Result Value   Chloride 97 (*)    Glucose, Bld 102 (*)    Creatinine, Ser 1.36 (*)    Calcium 8.3 (*)    GFR, Estimated 49 (*)    All other components within normal limits  D-DIMER, QUANTITATIVE - Abnormal; Notable for the following components:   D-Dimer, Quant 12.13 (*)    All other components within normal limits  CBC WITH DIFFERENTIAL/PLATELET  BRAIN NATRIURETIC PEPTIDE  TROPONIN I (HIGH SENSITIVITY)  TROPONIN I (HIGH SENSITIVITY)                                                                                                                          Radiology No results  found.  Pertinent labs & imaging results that were available during my care of the patient were reviewed by me and considered in my medical decision making (see MDM for details).  Medications Ordered in ED Medications  furosemide (LASIX) injection 40 mg (has no administration in time range)  apixaban (ELIQUIS) tablet 10 mg (has no administration in time range)  apixaban (ELIQUIS) Education Kit for DVT/PE patients (has no administration in time range)  Procedures Procedures  (including critical care time)  Medical Decision Making / ED Course   This patient presents to the ED for concern of lower extremity swelling, this involves an extensive number of treatment options, and is a complaint that carries with it a high risk of complications and morbidity.  The differential diagnosis includes DVT, gravity dependent edema, kidney failure, liver failure, CHF, vitamin deficiency  MDM: Patient seen emerged part for evaluation of lower extremity swelling.  Physical exam with asymmetric swelling with left greater than right but no significant tenderness palpation.  Pitting edema again worse on the left compared to the right but present in both legs.  Laboratory evaluation with a creatinine of 1.36 but is otherwise unremarkable.  BNP is normal.  As we do not have DVT ultrasound available today, I attempted to use Wells criteria with a D-dimer, but his D-dimer was significantly elevated to 12.13.  Given marked asymmetric edema and an elevated D-dimer, we will empirically start the patient on Eliquis and he will return tomorrow for DVT ultrasound.  Starter pack sent to the pharmacy already and ED physician tomorrow can tell the patient to fill this medication or not based on ultrasound findings tomorrow.  Single dose Lasix given and patient discharged with follow-up tomorrow for  DVT ultrasound.  Patient has no chest pain or shortness of breath, no tachycardia or hypoxia and I have overall low suspicion for PE at this time.  Thus CT PE deferred.  Patient then discharged with follow-up tomorrow.   Additional history obtained: -Additional history obtained from daughter -External records from outside source obtained and reviewed including: Chart review including previous notes, labs, imaging, consultation notes   Lab Tests: -I ordered, reviewed, and interpreted labs.   The pertinent results include:   Labs Reviewed  COMPREHENSIVE METABOLIC PANEL - Abnormal; Notable for the following components:      Result Value   Chloride 97 (*)    Glucose, Bld 102 (*)    Creatinine, Ser 1.36 (*)    Calcium 8.3 (*)    GFR, Estimated 49 (*)    All other components within normal limits  D-DIMER, QUANTITATIVE - Abnormal; Notable for the following components:   D-Dimer, Quant 12.13 (*)    All other components within normal limits  CBC WITH DIFFERENTIAL/PLATELET  BRAIN NATRIURETIC PEPTIDE  TROPONIN I (HIGH SENSITIVITY)  TROPONIN I (HIGH SENSITIVITY)       Imaging Studies ordered: I ordered imaging studies including outpatient DVT study   Medicines ordered and prescription drug management: Meds ordered this encounter  Medications   furosemide (LASIX) injection 40 mg   apixaban (ELIQUIS) tablet 10 mg   apixaban (ELIQUIS) Education Kit for DVT/PE patients   APIXABAN (ELIQUIS) VTE STARTER PACK ('10MG'$  AND '5MG'$ )    Sig: Take as directed on package: start with two-'5mg'$  tablets twice daily for 7 days. On day 8, switch to one-'5mg'$  tablet twice daily.    Dispense:  1 each    Refill:  0    -I have reviewed the patients home medicines and have made adjustments as needed  Critical interventions none   Cardiac Monitoring: The patient was maintained on a cardiac monitor.  I personally viewed and interpreted the cardiac monitored which showed an underlying rhythm of: NSR  Social  Determinants of Health:  Factors impacting patients care include: none   Reevaluation: After the interventions noted above, I reevaluated the patient and found that they have :stayed the same  Co morbidities that complicate the  patient evaluation  Past Medical History:  Diagnosis Date   Adrenal adenoma    Left   Arthritis    BPH (benign prostatic hyperplasia)    Cataract 2007, 2009   Bilateral   Colon polyps    Essential hypertension    Heart murmur    HX OF 30 YEARS AGO   Helicobacter pylori gastritis 09/09/2018   Malignant hyperthermia    Prostate cancer ([place]) 08/08/2007   Seed Implant and XRT   Spondylolisthesis    L5-S1      Dispostion: I considered admission for this patient, and this patient is hemodynamically stable currently, he will be discharged with ED follow-up tomorrow for DVT ultrasound     Final Clinical Impression(s) / ED Diagnoses Final diagnoses:  Leg swelling     '@PCDICTATION'$ @    Teressa Lower, MD 12/02/22 1916    Teressa Lower, MD 12/02/22 2013

## 2022-12-02 NOTE — Discharge Instructions (Addendum)
Follow up with Dr. Donzetta Matters or partners in 1-2 weeks.   Start taking the eliquis

## 2022-12-02 NOTE — ED Triage Notes (Signed)
Pt reports he takes fluid pills for leg edema but the left leg is still swollen which is not normal for him.

## 2022-12-03 ENCOUNTER — Ambulatory Visit (HOSPITAL_COMMUNITY)
Admission: RE | Admit: 2022-12-03 | Discharge: 2022-12-03 | Disposition: A | Discharge status: Home / Self Care | Payer: Medicare Other | Source: Ambulatory Visit | Attending: Student | Admitting: Student

## 2022-12-03 DIAGNOSIS — M7989 Other specified soft tissue disorders: Secondary | ICD-10-CM | POA: Insufficient documentation

## 2022-12-03 DIAGNOSIS — I82432 Acute embolism and thrombosis of left popliteal vein: Secondary | ICD-10-CM | POA: Diagnosis not present

## 2022-12-03 NOTE — ED Provider Notes (Signed)
Patient has a significant DVT left lower extremity.  He will start taking the Eliquis and is referred to the vascular surgeon Dr. Jannett Celestine, MD 12/03/22 [phone]

## 2022-12-06 ENCOUNTER — Ambulatory Visit (INDEPENDENT_AMBULATORY_CARE_PROVIDER_SITE_OTHER): Payer: Medicare Other | Admitting: Vascular Surgery

## 2022-12-06 ENCOUNTER — Encounter: Payer: Self-pay | Admitting: Vascular Surgery

## 2022-12-06 VITALS — BP 150/80 | HR 62 | Temp 98.2°F | Resp 18 | Ht 69.5 in | Wt 215.0 lb

## 2022-12-06 DIAGNOSIS — M7989 Other specified soft tissue disorders: Secondary | ICD-10-CM

## 2022-12-06 DIAGNOSIS — I82492 Acute embolism and thrombosis of other specified deep vein of left lower extremity: Secondary | ICD-10-CM

## 2022-12-06 DIAGNOSIS — O2230 Deep phlebothrombosis in pregnancy, unspecified trimester: Secondary | ICD-10-CM

## 2022-12-06 NOTE — Progress Notes (Signed)
Patient ID: Tracy Pratt, male   DOB: [birth year], 89 y.o.   MRN: [id number]  Reason for Consult: No chief complaint on file.   Referred by Tracy Lower, MD  Subjective:     HPI:  Tracy Pratt is a 89 y.o. male diagnosed earlier this week with extensive left Pratt extremity DVT.  He does have known swelling of his left greater than right Pratt extremity as well as varicosities in both Pratt extremities for multiple years and has never had any venous intervention.  He does not have a personal or family history of DVT prior to this.  He does have a history of prostate cancer is followed by Dr. Alyson Ingles.  Leg swelling has gotten only slightly better since beginning Eliquis earlier this week.  He does wear over-the-counter compression that he has had for multiple years has not really helped with his current level of swelling.  Past Medical History:  Diagnosis Date   Adrenal adenoma    Left   Arthritis    BPH (benign prostatic hyperplasia)    Cataract 2007, 2009   Bilateral   Colon polyps    Essential hypertension    Heart murmur    HX OF 30 YEARS AGO   Helicobacter pylori gastritis 09/09/2018   Malignant hyperthermia    Prostate cancer ([place]) 08/08/2007   Seed Implant and XRT   Spondylolisthesis    L5-S1   Family History  Problem Relation Age of Onset   Prostate cancer Brother    Lung disease Brother        lobectomy   Past Surgical History:  Procedure Laterality Date   APPENDECTOMY  [place]   BIOPSY  08/25/2018   Procedure: BIOPSY;  Surgeon: Gatha Mayer, MD;  [facility];  Service: Endoscopy;;   CATARACT EXTRACTION  2007   COLONOSCOPY  2012   [facility]: single diminutive hyperplastic polyp in rectum   CORNEAL TRANSPLANT  2007   CRYOABLATION N/A 06/09/2021   Procedure: CRYO ABLATION PROSTATE;  Surgeon: Cleon Gustin, MD;  Location: [facility];  Service: Urology;  Laterality: N/A;   ESOPHAGOGASTRODUODENOSCOPY (EGD) WITH  PROPOFOL N/A 08/25/2018   Empiric dilation of esophagus, erythematous mucosa in antrum s/p biopsy, normal duodenum. Clotest positive.   KNEE SURGERY  1996   left knee arthoscopic   MALONEY DILATION  08/25/2018   Procedure: MALONEY DILATION;  Surgeon: Gatha Mayer, MD;  [facility];  Service: Endoscopy;;   ROTATOR CUFF REPAIR  2015   Torn meniscus left knee  1996   TRANSURETHRAL RESECTION OF PROSTATE  1997    Short Social History:  Social History   Tobacco Use   Smoking status: Former    Packs/day: 1.00    Years: 15.00    Total pack years: 15.00    Types: Cigarettes   Smokeless tobacco: Never   Tobacco comments:    quit 28 years ago  Substance Use Topics   Alcohol use: No    Allergies  Allergen Reactions   Diclofenac Sodium Anaphylaxis   Nabumetone Anaphylaxis   Nsaids Anaphylaxis   Tolmetin Anaphylaxis   Bee Venom Swelling   Desflurane Other (See Comments)    Malignant hyperthermia   Enflurane Other (See Comments)    Malignant hyperthermia   Halothane Other (See Comments)    Malignant hyperthermia   Isoflurane Other (See Comments)    Malignant hyperthermia   Sevoflurane Other (See Comments)  Malignant hyperthermia   Succinylcholine Other (See Comments)    Malignant hyperthermia    Current Outpatient Medications  Medication Sig Dispense Refill   APIXABAN (ELIQUIS) VTE STARTER PACK ('10MG'$  AND '5MG'$ ) Take as directed on package: start with two-'5mg'$  tablets twice daily for 7 days. On day 8, switch to one-'5mg'$  tablet twice daily. 1 each 0   furosemide (LASIX) 40 MG tablet Take 40 mg by mouth daily as needed.     HYDROcodone-acetaminophen (NORCO) 7.5-325 MG tablet TAKE 1 TABLET EVERY 4 HOURS AS NEEDED FOR PAIN. 60 tablet 0   levocetirizine (XYZAL) 5 MG tablet Take 5 mg by mouth daily.     lisinopril-hydrochlorothiazide (PRINZIDE,ZESTORETIC) 10-12.5 MG tablet Take 1 tablet by mouth daily.     loteprednol (LOTEMAX) 0.5 % ophthalmic suspension Place 1 drop  into the right eye at bedtime.     oxybutynin (DITROPAN-XL) 10 MG 24 hr tablet Take 10 mg by mouth daily.     Polyethyl Glycol-Propyl Glycol (SYSTANE OP) Apply 1 drop to eye 2 (two) times daily.     prednisoLONE acetate (PRED FORTE) 1 % ophthalmic suspension Place 1 drop into the left eye 2 (two) times daily.     valACYclovir (VALTREX) 1000 MG tablet Take 1,000 mg by mouth daily.     Vitamin D, Ergocalciferol, (DRISDOL) 1.25 MG (50000 UNIT) CAPS capsule Take by mouth.     No current facility-administered medications for this visit.    Review of Systems  Constitutional:  Constitutional negative. HENT: HENT negative.  Eyes: Eyes negative.  Respiratory: Respiratory negative.  Cardiovascular: Positive for leg swelling.  GI: Gastrointestinal negative.  Musculoskeletal: Musculoskeletal negative.  Skin: Skin negative.  Neurological: Neurological negative. Hematologic: Hematologic/lymphatic negative.  Psychiatric: Psychiatric negative.        Objective:  Objective  Vitals:   12/06/22 1012  BP: (!) 150/80  Pulse: 62  Resp: 18  Temp: 98.2 F (36.8 C)  SpO2: 93%     Physical Exam HENT:     Head: Normocephalic.     Nose: Nose normal.  Eyes:     Pupils: Pupils are equal, round, and reactive to light.  Cardiovascular:     Rate and Rhythm: Normal rate.  Pulmonary:     Effort: Pulmonary effort is normal.  Abdominal:     General: Abdomen is flat.     Palpations: Abdomen is soft.  Musculoskeletal:        General: Normal range of motion.     Cervical back: Normal range of motion.     Right Pratt leg: No edema.     Left Pratt leg: Edema present.     Comments: Right leg measures 40 cm, left leg measures 45 cm (measured 10 cm from tibial tuberosity)  Skin:    General: Skin is warm.     Capillary Refill: Capillary refill takes less than 2 seconds.  Neurological:     General: No focal deficit present.     Mental Status: He is alert.  Psychiatric:        Mood and Affect: Mood  normal.        Behavior: Behavior normal.        Thought Content: Thought content normal.        Judgment: Judgment normal.     Data: Venous duplex FINDINGS: Contralateral Common Femoral Vein: Respiratory phasicity is normal and symmetric with the symptomatic side. No evidence of thrombus. Normal compressibility.   Common Femoral Vein: No evidence of thrombus. Normal compressibility, respiratory phasicity  and response to augmentation.   Saphenofemoral Junction: No evidence of thrombus. Normal compressibility and flow on color Doppler imaging.   Profunda Femoral Vein: No evidence of thrombus. Normal compressibility and flow on color Doppler imaging.   Femoral Vein: The femoral vein is abnormal in non compressible. Heterogeneous hypoechoic thrombus can be visualized within the vessel. No evidence of flow on color Doppler imaging. Findings are consistent with occlusive DVT.   Popliteal Vein: Occlusive DVT extends through the popliteal vein.   Calf Veins: Occlusive DVT extends through the calf veins.   Superficial Great Saphenous Vein: No evidence of thrombus. Normal compressibility.   Venous Reflux:  None.   Other Findings:  None.   IMPRESSION: Positive for extensive left Pratt extremity DVT beginning in the femoral vein in the proximal thigh and extending through the leg and into the calf veins.       Assessment/Plan:    89 year old male with recent left Pratt extremity extensive DVT extending from the femoral vein into the calf veins with now just over 10% edema of the left Pratt extremity.  I recommended Eliquis for 3 months at least unless his swelling has not improved or there is new finding of cancer diagnosis.  After Eliquis he will transition to baby aspirin daily.  We are fitting him for knee-high compression stockings today based off the size of his right Pratt extremity.  I discussed with the patient and his family that if he is not seeing any improvement in 3 to  4 months that we could consider intervention and I would need to see him back in the office prior to that.  Previous CT scan in May 2022 did not demonstrate any concern for May Thurner syndrome but given his known underlying swelling there may be some component of an obstructive process.  There is also likely reflux process at play here although more of his edema related to the recent extensive DVT.  Patient can see me on an as-needed basis.  All questions answered today.     Waynetta Sandy MD [facility]

## 2022-12-07 DIAGNOSIS — R350 Frequency of micturition: Secondary | ICD-10-CM | POA: Diagnosis not present

## 2022-12-07 DIAGNOSIS — N3941 Urge incontinence: Secondary | ICD-10-CM | POA: Diagnosis not present

## 2022-12-14 ENCOUNTER — Encounter: Payer: Self-pay | Admitting: Urology

## 2022-12-14 ENCOUNTER — Ambulatory Visit (INDEPENDENT_AMBULATORY_CARE_PROVIDER_SITE_OTHER): Payer: Medicare Other | Admitting: Urology

## 2022-12-14 VITALS — BP 116/68 | HR 84

## 2022-12-14 DIAGNOSIS — N3941 Urge incontinence: Secondary | ICD-10-CM

## 2022-12-14 DIAGNOSIS — C61 Malignant neoplasm of prostate: Secondary | ICD-10-CM | POA: Diagnosis not present

## 2022-12-14 DIAGNOSIS — N453 Epididymo-orchitis: Secondary | ICD-10-CM | POA: Diagnosis not present

## 2022-12-14 DIAGNOSIS — N5082 Scrotal pain: Secondary | ICD-10-CM | POA: Diagnosis not present

## 2022-12-14 LAB — MICROSCOPIC EXAMINATION: WBC, UA: >30 /hpf — AB (ref 0–5)

## 2022-12-14 LAB — URINALYSIS, ROUTINE W REFLEX MICROSCOPIC
Bilirubin, UA: NEGATIVE
Glucose, UA: NEGATIVE
Ketones, UA: NEGATIVE
Nitrite, UA: NEGATIVE
Protein,UA: NEGATIVE
Specific Gravity, UA: 1.01 (ref 1.005–1.030)
Urobilinogen, Ur: 0.2 mg/dL (ref 0.2–1.0)
pH, UA: 6 (ref 5.0–7.5)

## 2022-12-14 MED ORDER — SULFAMETHOXAZOLE-TRIMETHOPRIM 800-160 MG PO TABS: 1.0000 | ORAL_TABLET | Freq: Two times a day (BID) | ORAL | 0 refills | Status: DC

## 2022-12-14 NOTE — Patient Instructions (Signed)
Epididymitis  Epididymitis is inflammation or swelling of the epididymis. This is caused by an infection. The epididymis is a cord-like structure that is located along the top and back part of the testicle. It collects and stores sperm from the testicle. This condition can also cause pain and swelling of the testicle and scrotum. Symptoms usually start suddenly (acute epididymitis). Sometimes epididymitis starts gradually and lasts for a while (chronic epididymitis). Chronic epididymitis may be harder to treat. What are the causes? In men ages 20-40, this condition is usually caused by a bacterial infection or a sexually transmitted infection (STI), such as gonorrhea or chlamydia. In men 40 and older, this condition is usually caused by bacteria from a urinary blockage or from abnormalities in the urinary system. These can result from: Having a tube placed into the bladder (urinary catheter). Having an enlarged or inflamed prostate gland. Having recently had urinary tract surgery. Having a problem with a backward flow of urine (retrograde). In men who have a condition that weakens the body's defense system (immune system), such as human immunodeficiency virus (HIV), this condition can be caused by: Other bacteria, including tuberculosis and syphilis. Viruses. Fungi. Sometimes this condition occurs without infection. This may happen because of trauma or repetitive activities such as sports. What increases the risk? You are more likely to develop this condition if you have: Unprotected sex with more than one partner. Anal sex. Had recent surgery. A urinary catheter. Urinary problems. A suppressed immune system. What are the signs or symptoms? This condition usually begins suddenly with chills, fever, and pain behind the scrotum and in the testicle. Other symptoms include: Swelling of the scrotum, testicle, or both. Pain when ejaculating or urinating. Pain in the back or  abdomen. Nausea. Itching and discharge from the penis. A frequent need to pass urine. Redness, increased warmth, and tenderness of the scrotum. How is this diagnosed? Your health care provider can diagnose this condition based on your symptoms and medical history. Your health care provider will also do a physical exam to check your scrotum and testicle for swelling, pain, and redness. You may also have other tests, including: Testing of discharge from the penis. Testing your urine for infections, such as STIs. Ultrasound to check for blood flow and inflammation. Your health care provider may test you for other STIs, including HIV. How is this treated? Treatment for this condition depends on the cause. If your condition is caused by a bacterial infection, oral antibiotic medicine may be prescribed. If the bacterial infection has spread to your blood, you may need to receive IV antibiotics. For both bacterial and nonbacterial epididymitis, you may be treated with: Rest. Elevation of the scrotum. Pain medicines. Anti-inflammatory medicines. Surgery may be needed if: You have pus buildup in the scrotum (abscess). You have epididymitis that has not responded to other treatments. Follow these instructions at home: Medicines Take over-the-counter and prescription medicines only as told by your health care provider. If you were prescribed an antibiotic medicine, take it as told by your health care provider. Do not stop taking the antibiotic even if your condition improves. Sexual activity If your epididymitis was caused by an STI, avoid sexual activity until your treatment is complete. Inform your sexual partner or partners if you test positive for an STI. They may need to be treated. Do not engage in sexual activity with your partner or partners until their treatment is completed. Managing pain and swelling  If directed, raise (elevate) your scrotum and apply ice.   To do this: Put ice in a  plastic bag. Place a small towel or pillow between your legs. Rest your scrotum on the pillow or towel. Place another towel between your skin and the plastic bag. Leave the ice on for 20 minutes, 2-3 times a day. Remove the ice if your skin turns bright red. This is very important. If you cannot feel pain, heat, or cold, you have a greater risk of damage to the area. Keep your scrotum elevated and supported while resting. Ask your health care provider if you should wear a scrotal support, such as a jockstrap. Wear it as told by your health care provider. Try taking a sitz bath to help with discomfort. This is a warm water bath that is taken while you are sitting down. The water should come up to your hips and should cover your buttocks. Do this 3-4 times per day or as told by your health care provider. General instructions Drink enough fluid to keep your urine pale yellow. Return to your normal activities as told by your health care provider. Ask your health care provider what activities are safe for you. Keep all follow-up visits. This is important. Contact a health care provider if: You have a fever. Your pain medicine is not helping. Your pain is getting worse. Your symptoms do not improve within 3 days. Summary Epididymitis is inflammation or swelling of the epididymis. This is caused by an infection. This condition can also cause pain and swelling of the testicle and scrotum. Treatment for this condition depends on the cause. If your condition is caused by a bacterial infection, oral antibiotic medicine may be prescribed. Inform your sexual partner or partners if you test positive for an STI. They may need to be treated. Do not engage in sexual activity with your partner or partners until their treatment is completed. Contact a health care provider if your symptoms do not improve within 3 days. This information is not intended to replace advice given to you by your health care provider.  Make sure you discuss any questions you have with your health care provider. Document Revised: 05/24/2021 Document Reviewed: 05/24/2021 Elsevier Patient Education  2023 Elsevier Inc.  

## 2022-12-14 NOTE — Progress Notes (Signed)
12/14/2022 12:22 PM   Tracy Pratt 03-17-1931 [id number]  Referring provider: [name], MD [address] [place],  [place] [address]  Testicular pain   HPI: Tracy Pratt is a 89yo here for evaluation of right testicular pain and swelling. He noted over the past 3 days his right testis has enlarged and is now painful. No worsening LUTS. He uses a cunningham clamp for his incontinence. No hematuria or dysuria   PMH: Past Medical History:  Diagnosis Date   Adrenal adenoma    Left   Arthritis    BPH (benign prostatic hyperplasia)    Cataract 2007, 2009   Bilateral   Colon polyps    Essential hypertension    Heart murmur    HX OF 30 YEARS AGO   Helicobacter pylori gastritis 09/09/2018   Malignant hyperthermia    Prostate cancer ([place]) 08/08/2007   Seed Implant and XRT   Spondylolisthesis    L5-S1    Surgical History: Past Surgical History:  Procedure Laterality Date   [place]   BIOPSY  08/25/2018   Procedure: BIOPSY;  Surgeon: Gatha Mayer, MD;  Location: [facility];  Service: Endoscopy;;   CATARACT EXTRACTION  2007   COLONOSCOPY  2012   [facility]: single diminutive hyperplastic polyp in rectum   CORNEAL TRANSPLANT  2007   CRYOABLATION N/A 06/09/2021   Procedure: CRYO ABLATION PROSTATE;  Surgeon: Cleon Gustin, MD;  Location: [facility];  Service: Urology;  Laterality: N/A;   ESOPHAGOGASTRODUODENOSCOPY (EGD) WITH PROPOFOL N/A 08/25/2018   Empiric dilation of esophagus, erythematous mucosa in antrum s/p biopsy, normal duodenum. Clotest positive.   KNEE SURGERY  1996   left knee arthoscopic   MALONEY DILATION  08/25/2018   Procedure: MALONEY DILATION;  Surgeon: Gatha Mayer, MD;  Location: [facility];  Service: Endoscopy;;   ROTATOR CUFF REPAIR  2015   Torn meniscus left knee  1996   TRANSURETHRAL RESECTION OF PROSTATE  1997    Home Medications:  Allergies as of 12/14/2022       Reactions   Diclofenac  Sodium Anaphylaxis   Nabumetone Anaphylaxis   Nsaids Anaphylaxis   Tolmetin Anaphylaxis   Bee Venom Swelling   Desflurane Other (See Comments)   Malignant hyperthermia   Enflurane Other (See Comments)   Malignant hyperthermia   Halothane Other (See Comments)   Malignant hyperthermia   Isoflurane Other (See Comments)   Malignant hyperthermia   Sevoflurane Other (See Comments)   Malignant hyperthermia   Succinylcholine Other (See Comments)   Malignant hyperthermia        Medication List        Accurate as of December 14, 2022 12:22 PM. If you have any questions, ask your nurse or doctor.          Apixaban Starter Pack (53m and 583m Commonly known as: ELIQUIS STARTER PACK Take as directed on package: start with two-12m64mablets twice daily for 7 days. On day 8, switch to one-12mg72mblet twice daily.   furosemide 40 MG tablet Commonly known as: LASIX Take 40 mg by mouth daily as needed.   HYDROcodone-acetaminophen 7.5-325 MG tablet Commonly known as: NORCO TAKE 1 TABLET EVERY 4 HOURS AS NEEDED FOR PAIN.   levocetirizine 5 MG tablet Commonly known as: XYZAL Take 5 mg by mouth daily.   lisinopril-hydrochlorothiazide 10-12.5 MG tablet Commonly known as: ZESTORETIC Take 1 tablet by mouth daily.   loteprednol 0.5 % ophthalmic suspension  Commonly known as: LOTEMAX Place 1 drop into the right eye at bedtime.   oxybutynin 10 MG 24 hr tablet Commonly known as: DITROPAN-XL Take 10 mg by mouth daily.   prednisoLONE acetate 1 % ophthalmic suspension Commonly known as: PRED FORTE Place 1 drop into the left eye 2 (two) times daily.   SYSTANE OP Apply 1 drop to eye 2 (two) times daily.   valACYclovir 1000 MG tablet Commonly known as: VALTREX Take 1,000 mg by mouth daily.   Vitamin D (Ergocalciferol) 1.25 MG (50000 UNIT) Caps capsule Commonly known as: DRISDOL Take by mouth.        Allergies:  Allergies  Allergen Reactions   Diclofenac Sodium Anaphylaxis    Nabumetone Anaphylaxis   Nsaids Anaphylaxis   Tolmetin Anaphylaxis   Bee Venom Swelling   Desflurane Other (See Comments)    Malignant hyperthermia   Enflurane Other (See Comments)    Malignant hyperthermia   Halothane Other (See Comments)    Malignant hyperthermia   Isoflurane Other (See Comments)    Malignant hyperthermia   Sevoflurane Other (See Comments)    Malignant hyperthermia   Succinylcholine Other (See Comments)    Malignant hyperthermia    Family History: Family History  Problem Relation Age of Onset   Prostate cancer Brother    Lung disease Brother        lobectomy    Social History:  reports that he has quit smoking. His smoking use included cigarettes. He has a 15.00 pack-year smoking history. He has never used smokeless tobacco. He reports that he does not drink alcohol and does not use drugs.  ROS: All other review of systems were reviewed and are negative except what is noted above in HPI  Physical Exam: BP 116/68   Pulse 84   Constitutional:  Alert and oriented, No acute distress. HEENT: [place] AT, moist mucus membranes.  Trachea midline, no masses. Cardiovascular: No clubbing, cyanosis, or edema. Respiratory: Normal respiratory effort, no increased work of breathing. GI: Abdomen is soft, nontender, nondistended, no abdominal masses GU: No CVA tenderness. Circumcised phallus. No masses/lesions on penis, testis, scrotum. Right testis indurated, tender Lymph: No cervical or inguinal lymphadenopathy. Skin: No rashes, bruises or suspicious lesions. Neurologic: Grossly intact, no focal deficits, moving all 4 extremities. Psychiatric: Normal mood and affect.  Laboratory Data: Lab Results  Component Value Date   WBC 9.0 12/02/2022   HGB 15.4 12/02/2022   HCT 45.5 12/02/2022   MCV 97.8 12/02/2022   PLT 189 12/02/2022    Lab Results  Component Value Date   CREATININE 1.36 (H) 12/02/2022    No results found for: "PSA"  No results found for:  "TESTOSTERONE"  Lab Results  Component Value Date   HGBA1C 5.8 07/02/2017    Urinalysis    Component Value Date/Time   COLORURINE YELLOW 05/13/2022 1535   APPEARANCEUR CLEAR 05/13/2022 1535   APPEARANCEUR Clear 01/22/2022 1343   LABSPEC 1.009 05/13/2022 1535   PHURINE 6.0 05/13/2022 1535   GLUCOSEU NEGATIVE 05/13/2022 1535   HGBUR SMALL (A) 05/13/2022 1535   BILIRUBINUR NEGATIVE 05/13/2022 1535   BILIRUBINUR Negative 01/22/2022 1343   KETONESUR NEGATIVE 05/13/2022 1535   PROTEINUR NEGATIVE 05/13/2022 1535   UROBILINOGEN 0.2 09/25/2014 1525   NITRITE NEGATIVE 05/13/2022 1535   LEUKOCYTESUR MODERATE (A) 05/13/2022 1535    Lab Results  Component Value Date   LABMICR See below: 01/22/2022   WBCUA >30 (A) 01/22/2022   LABEPIT 0-10 01/22/2022   BACTERIA NONE SEEN 05/13/2022  Pertinent Imaging:  No results found for this or any previous visit.  No results found for this or any previous visit.  No results found for this or any previous visit.  No results found for this or any previous visit.  No results found for this or any previous visit.  No valid procedures specified. No results found for this or any previous visit.  No results found for this or any previous visit.   Assessment & Plan:    1. Scrotal pain -related to right epididymo-orchitis - Urinalysis, Routine w reflex microscopic  2. Urge incontinence -continue cunningham clamp  3. Prostate cancer ([place]) Followup 6-8 weeks with PSA   No follow-ups on file.  Nicolette Bang, MD  [facility] [place]

## 2022-12-27 ENCOUNTER — Encounter: Payer: Self-pay | Admitting: Radiology

## 2022-12-28 DIAGNOSIS — E6609 Other obesity due to excess calories: Secondary | ICD-10-CM | POA: Diagnosis not present

## 2022-12-28 DIAGNOSIS — C61 Malignant neoplasm of prostate: Secondary | ICD-10-CM | POA: Diagnosis not present

## 2022-12-28 DIAGNOSIS — I82402 Acute embolism and thrombosis of unspecified deep veins of left lower extremity: Secondary | ICD-10-CM | POA: Diagnosis not present

## 2022-12-28 DIAGNOSIS — Z6831 Body mass index (BMI) 31.0-31.9, adult: Secondary | ICD-10-CM | POA: Diagnosis not present

## 2023-01-08 DIAGNOSIS — R350 Frequency of micturition: Secondary | ICD-10-CM | POA: Diagnosis not present

## 2023-01-08 DIAGNOSIS — N3941 Urge incontinence: Secondary | ICD-10-CM | POA: Diagnosis not present

## 2023-01-11 ENCOUNTER — Other Ambulatory Visit: Payer: Medicare Other

## 2023-01-18 ENCOUNTER — Ambulatory Visit: Payer: Medicare Other | Admitting: Urology

## 2023-01-31 ENCOUNTER — Other Ambulatory Visit: Payer: Medicare Other

## 2023-01-31 ENCOUNTER — Other Ambulatory Visit: Payer: Self-pay

## 2023-01-31 DIAGNOSIS — C61 Malignant neoplasm of prostate: Secondary | ICD-10-CM

## 2023-02-01 ENCOUNTER — Other Ambulatory Visit: Payer: Medicare Other

## 2023-02-01 DIAGNOSIS — R350 Frequency of micturition: Secondary | ICD-10-CM | POA: Diagnosis not present

## 2023-02-01 DIAGNOSIS — N3941 Urge incontinence: Secondary | ICD-10-CM | POA: Diagnosis not present

## 2023-02-04 ENCOUNTER — Other Ambulatory Visit: Payer: Medicare Other

## 2023-02-04 ENCOUNTER — Other Ambulatory Visit: Payer: Self-pay

## 2023-02-04 DIAGNOSIS — C61 Malignant neoplasm of prostate: Secondary | ICD-10-CM | POA: Diagnosis not present

## 2023-02-05 ENCOUNTER — Other Ambulatory Visit: Payer: Medicare Other

## 2023-02-05 LAB — PSA: Prostate Specific Ag, Serum: 5.6 ng/mL — ABNORMAL HIGH (ref 0.0–4.0)

## 2023-02-08 ENCOUNTER — Ambulatory Visit (INDEPENDENT_AMBULATORY_CARE_PROVIDER_SITE_OTHER): Payer: Medicare Other | Admitting: Urology

## 2023-02-08 VITALS — BP 122/72 | HR 61

## 2023-02-08 DIAGNOSIS — C61 Malignant neoplasm of prostate: Secondary | ICD-10-CM | POA: Diagnosis not present

## 2023-02-08 DIAGNOSIS — N3941 Urge incontinence: Secondary | ICD-10-CM | POA: Diagnosis not present

## 2023-02-08 LAB — URINALYSIS, ROUTINE W REFLEX MICROSCOPIC
Bilirubin, UA: NEGATIVE
Glucose, UA: NEGATIVE
Ketones, UA: NEGATIVE
Nitrite, UA: NEGATIVE
Protein,UA: NEGATIVE
RBC, UA: NEGATIVE
Specific Gravity, UA: 1.01 (ref 1.005–1.030)
Urobilinogen, Ur: 0.2 mg/dL (ref 0.2–1.0)
pH, UA: 5.5 (ref 5.0–7.5)

## 2023-02-08 LAB — MICROSCOPIC EXAMINATION: Bacteria, UA: NONE SEEN

## 2023-02-08 NOTE — Patient Instructions (Signed)

## 2023-02-08 NOTE — Progress Notes (Unsigned)
02/08/2023 10:56 AM   Tracy Pratt [birth year] [id number]  Referring provider: Assunta Found, MD [address] [place],  [place] [address]  Followup prostate cancer and OAB  HPI: PSA increased to 5.6 from 3.3.   PMH: Past Medical History:  Diagnosis Date   Adrenal adenoma    Left   Arthritis    BPH (benign prostatic hyperplasia)    Cataract 2007, 2009   Bilateral   Colon polyps    Essential hypertension    Heart murmur    HX OF 30 YEARS AGO   Helicobacter pylori gastritis 09/09/2018   Malignant hyperthermia    Prostate cancer ([facility]) 08/08/2007   Seed Implant and XRT   Spondylolisthesis    L5-S1    Surgical History: Past Surgical History:  Procedure Laterality Date   APPENDECTOMY  1940   BACK SURGERY  1979   BIOPSY  08/25/2018   Procedure: BIOPSY;  Surgeon: Iva Boop, MD;  Location: [facility];  Service: Endoscopy;;   CATARACT EXTRACTION  2007   COLONOSCOPY  2012   [facility]: single diminutive hyperplastic polyp in rectum   CORNEAL TRANSPLANT  2007   CRYOABLATION N/A 06/09/2021   Procedure: CRYO ABLATION PROSTATE;  Surgeon: Malen Gauze, MD;  Location: [facility];  Service: Urology;  Laterality: N/A;   ESOPHAGOGASTRODUODENOSCOPY (EGD) WITH PROPOFOL N/A 08/25/2018   Empiric dilation of esophagus, erythematous mucosa in antrum s/p biopsy, normal duodenum. Clotest positive.   KNEE SURGERY  1996   left knee arthoscopic   MALONEY DILATION  08/25/2018   Procedure: MALONEY DILATION;  Surgeon: Iva Boop, MD;  Location: [facility];  Service: Endoscopy;;   ROTATOR CUFF REPAIR  2015   Torn meniscus left knee  1996   TRANSURETHRAL RESECTION OF PROSTATE  1997    Home Medications:  Allergies as of 02/08/2023       Reactions   Diclofenac Sodium Anaphylaxis   Nabumetone Anaphylaxis   Nsaids Anaphylaxis   Tolmetin Anaphylaxis   Bee Venom Swelling   Desflurane Other (See Comments)   Malignant hyperthermia   Enflurane Other (See Comments)    Malignant hyperthermia   Halothane Other (See Comments)   Malignant hyperthermia   Isoflurane Other (See Comments)   Malignant hyperthermia   Sevoflurane Other (See Comments)   Malignant hyperthermia   Succinylcholine Other (See Comments)   Malignant hyperthermia        Medication List        Accurate as of February 08, 2023 10:56 AM. If you have any questions, ask your nurse or doctor.          Apixaban Starter Pack (10mg  and 5mg ) Commonly known as: ELIQUIS STARTER PACK Take as directed on package: start with two-5mg  tablets twice daily for 7 days. On day 8, switch to one-5mg  tablet twice daily.   furosemide 40 MG tablet Commonly known as: LASIX Take 40 mg by mouth daily as needed.   HYDROcodone-acetaminophen 7.5-325 MG tablet Commonly known as: NORCO TAKE 1 TABLET EVERY 4 HOURS AS NEEDED FOR PAIN.   levocetirizine 5 MG tablet Commonly known as: XYZAL Take 5 mg by mouth daily.   lisinopril-hydrochlorothiazide 10-12.5 MG tablet Commonly known as: ZESTORETIC Take 1 tablet by mouth daily.   loteprednol 0.5 % ophthalmic suspension Commonly known as: LOTEMAX Place 1 drop into the right eye at bedtime.   oxybutynin 10 MG 24 hr tablet Commonly known as: DITROPAN-XL Take 10 mg by mouth daily.   prednisoLONE acetate 1 %  ophthalmic suspension Commonly known as: PRED FORTE Place 1 drop into the left eye 2 (two) times daily.   sulfamethoxazole-trimethoprim 800-160 MG tablet Commonly known as: BACTRIM DS Take 1 tablet by mouth every 12 (twelve) hours.   SYSTANE OP Apply 1 drop to eye 2 (two) times daily.   valACYclovir 1000 MG tablet Commonly known as: VALTREX Take 1,000 mg by mouth daily.   Vitamin D (Ergocalciferol) 1.25 MG (50000 UNIT) Caps capsule Commonly known as: DRISDOL Take by mouth.        Allergies:  Allergies  Allergen Reactions   Diclofenac Sodium Anaphylaxis   Nabumetone Anaphylaxis   Nsaids Anaphylaxis   Tolmetin Anaphylaxis   Bee  Venom Swelling   Desflurane Other (See Comments)    Malignant hyperthermia   Enflurane Other (See Comments)    Malignant hyperthermia   Halothane Other (See Comments)    Malignant hyperthermia   Isoflurane Other (See Comments)    Malignant hyperthermia   Sevoflurane Other (See Comments)    Malignant hyperthermia   Succinylcholine Other (See Comments)    Malignant hyperthermia    Family History: Family History  Problem Relation Age of Onset   Prostate cancer Brother    Lung disease Brother        lobectomy    Social History:  reports that he has quit smoking. His smoking use included cigarettes. He has a 15.00 pack-year smoking history. He has never used smokeless tobacco. He reports that he does not drink alcohol and does not use drugs.  ROS: All other review of systems were reviewed and are negative except what is noted above in HPI  Physical Exam: BP 122/72   Pulse 61   Constitutional:  Alert and oriented, No acute distress. HEENT: [place] AT, moist mucus membranes.  Trachea midline, no masses. Cardiovascular: No clubbing, cyanosis, or edema. Respiratory: Normal respiratory effort, no increased work of breathing. GI: Abdomen is soft, nontender, nondistended, no abdominal masses GU: No CVA tenderness.  Lymph: No cervical or inguinal lymphadenopathy. Skin: No rashes, bruises or suspicious lesions. Neurologic: Grossly intact, no focal deficits, moving all 4 extremities. Psychiatric: Normal mood and affect.  Laboratory Data: Lab Results  Component Value Date   WBC 9.0 12/02/2022   HGB 15.4 12/02/2022   HCT 45.5 12/02/2022   MCV 97.8 12/02/2022   PLT 189 12/02/2022    Lab Results  Component Value Date   CREATININE 1.36 (H) 12/02/2022    No results found for: "PSA"  No results found for: "TESTOSTERONE"  Lab Results  Component Value Date   HGBA1C 5.8 07/02/2017    Urinalysis    Component Value Date/Time   COLORURINE YELLOW 05/13/2022 1535   APPEARANCEUR  Cloudy (A) 12/14/2022 1202   LABSPEC 1.009 05/13/2022 1535   PHURINE 6.0 05/13/2022 1535   GLUCOSEU Negative 12/14/2022 1202   HGBUR SMALL (A) 05/13/2022 1535   BILIRUBINUR Negative 12/14/2022 1202   KETONESUR NEGATIVE 05/13/2022 1535   PROTEINUR Negative 12/14/2022 1202   PROTEINUR NEGATIVE 05/13/2022 1535   UROBILINOGEN 0.2 09/25/2014 1525   NITRITE Negative 12/14/2022 1202   NITRITE NEGATIVE 05/13/2022 1535   LEUKOCYTESUR 3+ (A) 12/14/2022 1202   LEUKOCYTESUR MODERATE (A) 05/13/2022 1535    Lab Results  Component Value Date   LABMICR See below: 12/14/2022   WBCUA >30 (A) 12/14/2022   LABEPIT 0-10 12/14/2022   BACTERIA Few 12/14/2022    Pertinent Imaging: *** No results found for this or any previous visit.  No results found for this  or any previous visit.  No results found for this or any previous visit.  No results found for this or any previous visit.  No results found for this or any previous visit.  No valid procedures specified. No results found for this or any previous visit.  No results found for this or any previous visit.   Assessment & Plan:    1. Prostate cancer -PSMA PET - Urinalysis, Routine w reflex microscopic  2. Urge inciontinence -continue maintenance PTNS   No follow-ups on file.  Wilkie Aye, MD  [facility] [place]

## 2023-02-11 ENCOUNTER — Telehealth: Payer: Self-pay | Admitting: Orthopaedic Surgery

## 2023-02-11 MED ORDER — HYDROCODONE-ACETAMINOPHEN 7.5-325 MG PO TABS: ORAL_TABLET | ORAL | 0 refills | Status: DC

## 2023-02-12 ENCOUNTER — Encounter: Payer: Self-pay | Admitting: Urology

## 2023-02-12 NOTE — Telephone Encounter (Signed)
Called patient to advise him that before pain medication will be refilled again, he will need to come in for a follow up visit with Dr.Keeling. No answer. No vm

## 2023-02-14 ENCOUNTER — Encounter (HOSPITAL_COMMUNITY)
Admission: RE | Admit: 2023-02-14 | Discharge: 2023-02-14 | Disposition: A | Discharge status: Home / Self Care | Payer: Medicare Other | Source: Ambulatory Visit | Attending: Urology | Admitting: Urology

## 2023-02-14 DIAGNOSIS — C61 Malignant neoplasm of prostate: Secondary | ICD-10-CM | POA: Diagnosis not present

## 2023-02-14 MED ORDER — PIFLIFOLASTAT F 18 (PYLARIFY) INJECTION
9.0000 | Freq: Once | INTRAVENOUS | Status: AC
  Administered 2023-02-14: 9.29 via INTRAVENOUS

## 2023-02-19 ENCOUNTER — Encounter: Payer: Self-pay | Admitting: Orthopaedic Surgery

## 2023-02-19 ENCOUNTER — Ambulatory Visit (INDEPENDENT_AMBULATORY_CARE_PROVIDER_SITE_OTHER): Payer: Medicare Other | Admitting: Orthopaedic Surgery

## 2023-02-19 VITALS — BP 135/62 | HR 66 | Ht 69.5 in | Wt 218.0 lb

## 2023-02-19 DIAGNOSIS — G8929 Other chronic pain: Secondary | ICD-10-CM

## 2023-02-19 DIAGNOSIS — M545 Low back pain, unspecified: Secondary | ICD-10-CM

## 2023-02-19 DIAGNOSIS — M25562 Pain in left knee: Secondary | ICD-10-CM

## 2023-02-19 NOTE — Progress Notes (Signed)
My back is tender  He has recurrent back pain, good days and bad days.  He has no new trauma.  He has left knee pain also.  He has no weakness.  He was out of pain medicine.  I have to see him every three months to get the medicine.  The pain medicine works.  Spine/Pelvis examination:  Inspection:  Overall, sacoiliac joint benign and hips nontender; without crepitus or defects.   Thoracic spine inspection: Alignment normal without kyphosis present   Lumbar spine inspection:  Alignment  with normal lumbar lordosis, without scoliosis apparent.   Thoracic spine palpation:  without tenderness of spinal processes   Lumbar spine palpation: without tenderness of lumbar area; without tightness of lumbar muscles    Range of Motion:   Lumbar flexion, forward flexion is normal without pain or tenderness    Lumbar extension is full without pain or tenderness   Left lateral bend is normal without pain or tenderness   Right lateral bend is normal without pain or tenderness   Straight leg raising is normal  Strength & tone: normal   Stability overall normal stability  Left knee has ROM 0 to 110, stable, effusion, crepitus.  Encounter Diagnoses  Name Primary?   Chronic pain of left knee Yes   Lumbar pain    Return in three months.  Call if any problem.  Precautions discussed.  Electronically Signed Darreld Mclean, MD 4/23/20241:43 PM

## 2023-03-01 DIAGNOSIS — N3941 Urge incontinence: Secondary | ICD-10-CM | POA: Diagnosis not present

## 2023-03-05 ENCOUNTER — Encounter: Payer: Self-pay | Admitting: Orthopaedic Surgery

## 2023-03-05 ENCOUNTER — Ambulatory Visit (INDEPENDENT_AMBULATORY_CARE_PROVIDER_SITE_OTHER): Payer: Medicare Other | Admitting: Orthopaedic Surgery

## 2023-03-05 DIAGNOSIS — M25512 Pain in left shoulder: Secondary | ICD-10-CM | POA: Diagnosis not present

## 2023-03-05 DIAGNOSIS — G8929 Other chronic pain: Secondary | ICD-10-CM | POA: Diagnosis not present

## 2023-03-05 NOTE — Progress Notes (Signed)
My left shoulder is hurting.  He broke down many boxes at the food pantry last week and started having left shoulder pain again.  PROCEDURE NOTE:  The patient request injection, verbal consent was obtained.  The left shoulder was prepped appropriately after time out was performed.   Sterile technique was observed and injection of 1 cc of DepoMedrol 40mg  with several cc's of plain xylocaine. Anesthesia was provided by ethyl chloride and a 20-gauge needle was used to inject the shoulder area. A posterior approach was used.  The injection was tolerated well.  A band aid dressing was applied.  The patient was advised to apply ice later today and tomorrow to the injection sight as needed.  Encounter Diagnosis  Name Primary?   Chronic left shoulder pain Yes   Return prn.  Call if any problem.  Precautions discussed.  Electronically Signed Darreld Mclean, MD 5/7/202410:33 AM

## 2023-03-07 ENCOUNTER — Telehealth: Payer: Self-pay

## 2023-03-07 NOTE — Telephone Encounter (Signed)
Pt's daughter, Uvaldo Rising, called requesting to buy more compression stockings, but needed them mailed to the pt's home.  Encarnacion Slates and gave her the information for [employer]. Confirmed understanding.

## 2023-03-08 ENCOUNTER — Ambulatory Visit: Payer: Medicare Other | Admitting: Urology

## 2023-03-15 ENCOUNTER — Ambulatory Visit (INDEPENDENT_AMBULATORY_CARE_PROVIDER_SITE_OTHER): Payer: Medicare Other | Admitting: Urology

## 2023-03-15 ENCOUNTER — Encounter: Payer: Self-pay | Admitting: Urology

## 2023-03-15 VITALS — BP 107/69 | HR 74

## 2023-03-15 DIAGNOSIS — N390 Urinary tract infection, site not specified: Secondary | ICD-10-CM

## 2023-03-15 DIAGNOSIS — Z8546 Personal history of malignant neoplasm of prostate: Secondary | ICD-10-CM

## 2023-03-15 DIAGNOSIS — R32 Unspecified urinary incontinence: Secondary | ICD-10-CM | POA: Diagnosis not present

## 2023-03-15 DIAGNOSIS — N419 Inflammatory disease of prostate, unspecified: Secondary | ICD-10-CM | POA: Diagnosis not present

## 2023-03-15 DIAGNOSIS — C61 Malignant neoplasm of prostate: Secondary | ICD-10-CM

## 2023-03-15 LAB — URINALYSIS, ROUTINE W REFLEX MICROSCOPIC
Bilirubin, UA: NEGATIVE
Glucose, UA: NEGATIVE
Ketones, UA: NEGATIVE
Nitrite, UA: NEGATIVE
Protein,UA: NEGATIVE
Specific Gravity, UA: 1.01 (ref 1.005–1.030)
Urobilinogen, Ur: 0.2 mg/dL (ref 0.2–1.0)
pH, UA: 6.5 (ref 5.0–7.5)

## 2023-03-15 LAB — MICROSCOPIC EXAMINATION: Bacteria, UA: NONE SEEN

## 2023-03-15 MED ORDER — DOXYCYCLINE HYCLATE 100 MG PO CAPS
100.0000 mg | ORAL_CAPSULE | Freq: Two times a day (BID) | ORAL | 0 refills | Status: DC
Start: 2023-03-15 — End: 2023-07-10

## 2023-03-15 NOTE — Progress Notes (Signed)
03/15/2023 1:08 PM   Tracy Pratt 1930-12-16 [id number]  Referring provider: Assunta Found, MD [address] [place],  [place] [address]  Chief Complaint  Patient presents with   pet scan    HPI: [name] is a 89yo here for followup for a rising PSa and urinary incontinence. PSMA PET showed no evidence of metastatic prostate cancer. He continues to have constant urinary incontinence for which he uses a cunningham clamp   PMH: Past Medical History:  Diagnosis Date   Adrenal adenoma    Left   Arthritis    BPH (benign prostatic hyperplasia)    Cataract 2007, 2009   Bilateral   Colon polyps    Essential hypertension    Heart murmur    HX OF 30 YEARS AGO   Helicobacter pylori gastritis 09/09/2018   Malignant hyperthermia    Prostate cancer (HCC) 08/08/2007   Seed Implant and XRT   Spondylolisthesis    L5-S1    Surgical History: Past Surgical History:  Procedure Laterality Date   APPENDECTOMY  1940   BACK SURGERY  1979   BIOPSY  08/25/2018   Procedure: BIOPSY;  Surgeon: Iva Boop, MD;  Location: [facility];  Service: Endoscopy;;   CATARACT EXTRACTION  2007   COLONOSCOPY  2012   [facility]: single diminutive hyperplastic polyp in rectum   CORNEAL TRANSPLANT  2007   CRYOABLATION N/A 06/09/2021   Procedure: CRYO ABLATION PROSTATE;  Surgeon: Malen Gauze, MD;  Location: [facility];  Service: Urology;  Laterality: N/A;   ESOPHAGOGASTRODUODENOSCOPY (EGD) WITH PROPOFOL N/A 08/25/2018   Empiric dilation of esophagus, erythematous mucosa in antrum s/p biopsy, normal duodenum. Clotest positive.   KNEE SURGERY  1996   left knee arthoscopic   MALONEY DILATION  08/25/2018   Procedure: MALONEY DILATION;  Surgeon: Iva Boop, MD;  Location: [facility];  Service: Endoscopy;;   ROTATOR CUFF REPAIR  2015   Torn meniscus left knee  1996   TRANSURETHRAL RESECTION OF PROSTATE  1997    Home Medications:  Allergies as of 03/15/2023       Reactions    Diclofenac Sodium Anaphylaxis   Nabumetone Anaphylaxis   Nsaids Anaphylaxis   Tolmetin Anaphylaxis   Bee Venom Swelling   Desflurane Other (See Comments)   Malignant hyperthermia   Enflurane Other (See Comments)   Malignant hyperthermia   Halothane Other (See Comments)   Malignant hyperthermia   Isoflurane Other (See Comments)   Malignant hyperthermia   Sevoflurane Other (See Comments)   Malignant hyperthermia   Succinylcholine Other (See Comments)   Malignant hyperthermia        Medication List        Accurate as of Mar 15, 2023  1:08 PM. If you have any questions, ask your nurse or doctor.          STOP taking these medications    sulfamethoxazole-trimethoprim 800-160 MG tablet Commonly known as: BACTRIM DS Stopped by: Wilkie Aye, MD       TAKE these medications    Apixaban Starter Pack (10mg  and 5mg ) Commonly known as: ELIQUIS STARTER PACK Take as directed on package: start with two-5mg  tablets twice daily for 7 days. On day 8, switch to one-5mg  tablet twice daily.   furosemide 40 MG tablet Commonly known as: LASIX Take 40 mg by mouth daily as needed.   HYDROcodone-acetaminophen 7.5-325 MG tablet Commonly known as: NORCO TAKE 1 TABLET EVERY 4 HOURS AS NEEDED FOR PAIN.   levocetirizine  5 MG tablet Commonly known as: XYZAL Take 5 mg by mouth daily.   lisinopril-hydrochlorothiazide 10-12.5 MG tablet Commonly known as: ZESTORETIC Take 1 tablet by mouth daily.   loteprednol 0.5 % ophthalmic suspension Commonly known as: LOTEMAX Place 1 drop into the right eye at bedtime.   oxybutynin 10 MG 24 hr tablet Commonly known as: DITROPAN-XL Take 10 mg by mouth daily.   prednisoLONE acetate 1 % ophthalmic suspension Commonly known as: PRED FORTE Place 1 drop into the left eye 2 (two) times daily.   SYSTANE OP Apply 1 drop to eye 2 (two) times daily.   valACYclovir 1000 MG tablet Commonly known as: VALTREX Take 1,000 mg by mouth daily.    Vitamin D (Ergocalciferol) 1.25 MG (50000 UNIT) Caps capsule Commonly known as: DRISDOL Take by mouth.        Allergies:  Allergies  Allergen Reactions   Diclofenac Sodium Anaphylaxis   Nabumetone Anaphylaxis   Nsaids Anaphylaxis   Tolmetin Anaphylaxis   Bee Venom Swelling   Desflurane Other (See Comments)    Malignant hyperthermia   Enflurane Other (See Comments)    Malignant hyperthermia   Halothane Other (See Comments)    Malignant hyperthermia   Isoflurane Other (See Comments)    Malignant hyperthermia   Sevoflurane Other (See Comments)    Malignant hyperthermia   Succinylcholine Other (See Comments)    Malignant hyperthermia    Family History: Family History  Problem Relation Age of Onset   Prostate cancer Brother    Lung disease Brother        lobectomy    Social History:  reports that he has quit smoking. His smoking use included cigarettes. He has a 15.00 pack-year smoking history. He has never used smokeless tobacco. He reports that he does not drink alcohol and does not use drugs.  ROS: All other review of systems were reviewed and are negative except what is noted above in HPI  Physical Exam: BP 107/69   Pulse 74   Constitutional:  Alert and oriented, No acute distress. HEENT: [place] AT, moist mucus membranes.  Trachea midline, no masses. Cardiovascular: No clubbing, cyanosis, or edema. Respiratory: Normal respiratory effort, no increased work of breathing. GI: Abdomen is soft, nontender, nondistended, no abdominal masses GU: No CVA tenderness.  Lymph: No cervical or inguinal lymphadenopathy. Skin: No rashes, bruises or suspicious lesions. Neurologic: Grossly intact, no focal deficits, moving all 4 extremities. Psychiatric: Normal mood and affect.  Laboratory Data: Lab Results  Component Value Date   WBC 9.0 12/02/2022   HGB 15.4 12/02/2022   HCT 45.5 12/02/2022   MCV 97.8 12/02/2022   PLT 189 12/02/2022    Lab Results  Component Value Date    CREATININE 1.36 (H) 12/02/2022    No results found for: "PSA"  No results found for: "TESTOSTERONE"  Lab Results  Component Value Date   HGBA1C 5.8 07/02/2017    Urinalysis    Component Value Date/Time   COLORURINE YELLOW 05/13/2022 1535   APPEARANCEUR Clear 02/08/2023 1023   LABSPEC 1.009 05/13/2022 1535   PHURINE 6.0 05/13/2022 1535   GLUCOSEU Negative 02/08/2023 1023   HGBUR SMALL (A) 05/13/2022 1535   BILIRUBINUR Negative 02/08/2023 1023   KETONESUR NEGATIVE 05/13/2022 1535   PROTEINUR Negative 02/08/2023 1023   PROTEINUR NEGATIVE 05/13/2022 1535   UROBILINOGEN 0.2 09/25/2014 1525   NITRITE Negative 02/08/2023 1023   NITRITE NEGATIVE 05/13/2022 1535   LEUKOCYTESUR Trace (A) 02/08/2023 1023   LEUKOCYTESUR MODERATE (A) 05/13/2022 1535  Lab Results  Component Value Date   LABMICR See below: 02/08/2023   WBCUA 0-5 02/08/2023   LABEPIT 0-10 02/08/2023   BACTERIA None seen 02/08/2023    Pertinent Imaging: PSMA PET: Images reviewed and discussed with the patient  No results found for this or any previous visit.  No results found for this or any previous visit.  No results found for this or any previous visit.  No results found for this or any previous visit.  No results found for this or any previous visit.  No valid procedures specified. No results found for this or any previous visit.  No results found for this or any previous visit.   Assessment & Plan:    1. Prostate cancer (HCC) -PSA in 3 months  2. prostatitis -doxycycline 100mg  BID for 21 days - Urinalysis, Routine w reflex microscopic   No follow-ups on file.  Wilkie Aye, MD  [facility] [place]

## 2023-03-19 ENCOUNTER — Encounter: Payer: Self-pay | Admitting: Urology

## 2023-03-19 NOTE — Patient Instructions (Signed)

## 2023-03-29 DIAGNOSIS — N3941 Urge incontinence: Secondary | ICD-10-CM | POA: Diagnosis not present

## 2023-03-29 DIAGNOSIS — R350 Frequency of micturition: Secondary | ICD-10-CM | POA: Diagnosis not present

## 2023-04-01 ENCOUNTER — Telehealth: Payer: Self-pay

## 2023-04-01 ENCOUNTER — Ambulatory Visit
Admission: RE | Admit: 2023-04-01 | Discharge: 2023-04-01 | Disposition: A | Discharge status: Home / Self Care | Payer: Medicare Other | Source: Ambulatory Visit | Attending: Nurse Practitioner | Admitting: Nurse Practitioner

## 2023-04-01 VITALS — BP 136/75 | HR 67 | Temp 98.3°F | Resp 16

## 2023-04-01 DIAGNOSIS — N481 Balanitis: Secondary | ICD-10-CM

## 2023-04-01 DIAGNOSIS — Z8546 Personal history of malignant neoplasm of prostate: Secondary | ICD-10-CM

## 2023-04-01 MED ORDER — NYSTATIN-TRIAMCINOLONE 100000-0.1 UNIT/GM-% EX OINT: TOPICAL_OINTMENT | CUTANEOUS | 0 refills | Status: DC

## 2023-04-01 MED ORDER — FLUCONAZOLE 150 MG PO TABS: ORAL_TABLET | ORAL | 0 refills | Status: DC

## 2023-04-01 NOTE — ED Triage Notes (Signed)
Pt presents to UC w/ c/o redness, flakiness, swelling on penis x3 days. Pt states it is located on the area that he uses a penile clamp for incontinence. Pt currently on doxycycline Hx prostate ca

## 2023-04-01 NOTE — Telephone Encounter (Signed)
Patient's daughter called advising that Friday her father noticed a rash near his groin. She advised they are going out of town and wanted to ensure he was prescribed a cream if needed. I know we don't have any openings this week for an appointment. She requested a nurse call her.   Thank you

## 2023-04-01 NOTE — ED Provider Notes (Signed)
[facility]    CSN: [id number] Arrival date & time: 04/01/23  1314      History   Chief Complaint Chief Complaint  Patient presents with   Rash    HPI Tracy Pratt is a 89 y.o. male.   The history is provided by the patient.   The patient presents for complaints of redness, swelling, and white "flakiness" at the tip of the penis since been present for the past 3 days.  Patient states that today, he has noticed less redness and swelling.  He states that the tip of the penis is not tender.  Patient reports that he is uncircumcised.  Patient reports history of prostate cancer, and now has to wear a Cunningham clamp to help with leaking and incontinence.  Patient is also currently on doxycycline for a urinary infection.  Patient denies prior history of the same or similar symptoms.  Patient currently wears a brief, stating that he has to change approximately 6 times per day since he had prior prostate procedures in the past.  Past Medical History:  Diagnosis Date   Adrenal adenoma    Left   Arthritis    BPH (benign prostatic hyperplasia)    Cataract 2007, 2009   Bilateral   Colon polyps    Essential hypertension    Heart murmur    HX OF 30 YEARS AGO   Helicobacter pylori gastritis 09/09/2018   Malignant hyperthermia    Prostate cancer (HCC) 08/08/2007   Seed Implant and XRT   Spondylolisthesis    L5-S1    Patient Active Problem List   Diagnosis Date Noted   Deep vein blood clot of left lower extremity (HCC) 12/02/2022   Syncope 05/13/2022   Scalp laceration 05/13/2022   AKI (acute kidney injury) (HCC) 05/13/2022   Hyperglycemia 05/13/2022   Obesity (BMI 30-39.9) 05/13/2022   Urge incontinence 05/13/2022   History of prostate cancer 05/13/2022   Pancreatic lesion 03/24/2020   Bilateral hand pain 02/29/2020   Dupuytren's contracture of both hands 02/29/2020   Helicobacter pylori gastritis 09/09/2018   Parotid swelling 09/08/2018   Esophageal  dysphagia    Prediabetes 08/19/2017   Subclinical hypothyroidism 08/19/2017   Adrenal nodule (HCC) 08/19/2017   Hypoglycemia 08/14/2017   Piriformis syndrome of right side 07/29/2017   Spondylolisthesis at L5-S1 level 12/13/2015   Midline low back pain without sciatica 12/13/2015   H/O repair of left rotator cuff 04/19/2014   Pain in joint, shoulder region 03/23/2014   Muscle weakness (generalized) 03/23/2014   Decreased range of motion of left shoulder 03/23/2014   Essential hypertension 02/23/2014   Torn rotator cuff 02/09/2014   Prostate cancer (HCC) 10/31/2011   Spondylolisthesis    BPH (benign prostatic hyperplasia)    Malignant hyperthermia    Adrenal adenoma     Past Surgical History:  Procedure Laterality Date   APPENDECTOMY  1940   BACK SURGERY  1979   BIOPSY  08/25/2018   Procedure: BIOPSY;  Surgeon: Iva Boop, MD;  Location: [facility];  Service: Endoscopy;;   CATARACT EXTRACTION  2007   COLONOSCOPY  2012   [facility]: single diminutive hyperplastic polyp in rectum   CORNEAL TRANSPLANT  2007   CRYOABLATION N/A 06/09/2021   Procedure: CRYO ABLATION PROSTATE;  Surgeon: Malen Gauze, MD;  Location: [facility];  Service: Urology;  Laterality: N/A;   ESOPHAGOGASTRODUODENOSCOPY (EGD) WITH PROPOFOL N/A 08/25/2018   Empiric dilation of esophagus, erythematous mucosa in antrum s/p biopsy, normal  duodenum. Clotest positive.   KNEE SURGERY  1996   left knee arthoscopic   MALONEY DILATION  08/25/2018   Procedure: MALONEY DILATION;  Surgeon: Iva Boop, MD;  Location: [facility];  Service: Endoscopy;;   ROTATOR CUFF REPAIR  2015   Torn meniscus left knee  1996   TRANSURETHRAL RESECTION OF PROSTATE  1997       Home Medications    Prior to Admission medications   Medication Sig Start Date End Date Taking? Authorizing Provider  APIXABAN (ELIQUIS) VTE STARTER PACK (10MG  AND 5MG ) Take as directed on package: start with two-5mg  tablets twice daily for 7  days. On day 8, switch to one-5mg  tablet twice daily. 12/02/22   Kommor, Madison, MD  doxycycline (VIBRAMYCIN) 100 MG capsule Take 1 capsule (100 mg total) by mouth every 12 (twelve) hours. 03/15/23   McKenzie, Mardene Celeste, MD  furosemide (LASIX) 40 MG tablet Take 40 mg by mouth daily as needed. 05/28/22   [provider]  HYDROcodone-acetaminophen (NORCO) 7.5-325 MG tablet TAKE 1 TABLET EVERY 4 HOURS AS NEEDED FOR PAIN. 02/11/23   Darreld Mclean, MD  levocetirizine (XYZAL) 5 MG tablet Take 5 mg by mouth daily. 02/14/21   [provider]  lisinopril-hydrochlorothiazide (PRINZIDE,ZESTORETIC) 10-12.5 MG tablet Take 1 tablet by mouth daily.    [provider]  loteprednol (LOTEMAX) 0.5 % ophthalmic suspension Place 1 drop into the right eye at bedtime.    [provider]  oxybutynin (DITROPAN-XL) 10 MG 24 hr tablet Take 10 mg by mouth daily. 04/21/22   [provider]  Polyethyl Glycol-Propyl Glycol (SYSTANE OP) Apply 1 drop to eye 2 (two) times daily.    [provider]  prednisoLONE acetate (PRED FORTE) 1 % ophthalmic suspension Place 1 drop into the left eye 2 (two) times daily. 01/18/21   [provider]  valACYclovir (VALTREX) 1000 MG tablet Take 1,000 mg by mouth daily. 04/28/22   [provider]  Vitamin D, Ergocalciferol, (DRISDOL) 1.25 MG (50000 UNIT) CAPS capsule Take by mouth. 05/29/22   [provider]    Family History Family History  Problem Relation Age of Onset   Prostate cancer Brother    Lung disease Brother        lobectomy    Social History Social History   Tobacco Use   Smoking status: Former    Packs/day: 1.00    Years: 15.00    Additional pack years: 0.00    Total pack years: 15.00    Types: Cigarettes   Smokeless tobacco: Never   Tobacco comments:    quit 28 years ago  Vaping Use   Vaping Use: Never used  Substance Use Topics   Alcohol use: No   Drug use: No     Allergies   Diclofenac  sodium, Nabumetone, Nsaids, Tolmetin, Bee venom, Desflurane, Enflurane, Halothane, Isoflurane, Sevoflurane, and Succinylcholine   Review of Systems Review of Systems Per HPI  Physical Exam Triage Vital Signs ED Triage Vitals  Enc Vitals Group     BP 04/01/23 1341 136/75     Pulse Rate 04/01/23 1341 67     Resp 04/01/23 1341 16     Temp 04/01/23 1341 98.3 F (36.8 C)     Temp Source 04/01/23 1341 Oral     SpO2 04/01/23 1341 94 %     Weight --      Height --      Head Circumference --      Peak Flow --  Pain Score 04/01/23 1340 0     Pain Loc --      Pain Edu? --      Excl. in GC? --    No data found.  Updated Vital Signs BP 136/75 (BP Location: Right Arm)   Pulse 67   Temp 98.3 F (36.8 C) (Oral)   Resp 16   SpO2 94%   Visual Acuity Right Eye Distance:   Left Eye Distance:   Bilateral Distance:    Right Eye Near:   Left Eye Near:    Bilateral Near:     Physical Exam Vitals and nursing note reviewed. Chaperone present: Patient's daughter is present.  Constitutional:      General: He is not in acute distress.    Appearance: Normal appearance.  HENT:     Head: Normocephalic.  Eyes:     Extraocular Movements: Extraocular movements intact.     Pupils: Pupils are equal, round, and reactive to light.  Pulmonary:     Effort: Pulmonary effort is normal.  Genitourinary:    Penis: Uncircumcised. Erythema, tenderness and swelling present. No discharge.      Comments: Urinary incontinence is present Musculoskeletal:     Cervical back: Normal range of motion.  Skin:    General: Skin is warm and dry.  Neurological:     General: No focal deficit present.     Mental Status: He is alert and oriented to person, place, and time.  Psychiatric:        Mood and Affect: Mood normal.        Behavior: Behavior normal.      UC Treatments / Results  Labs (all labs ordered are listed, but only abnormal results are displayed) Labs Reviewed - No data to  display  EKG   Radiology No results found.  Procedures Procedures (including critical care time)  Medications Ordered in UC Medications - No data to display  Initial Impression / Assessment and Plan / UC Course  I have reviewed the triage vital signs and the nursing notes.  Pertinent labs & imaging results that were available during my care of the patient were reviewed by me and considered in my medical decision making (see chart for details).  The patient is well-appearing, he is in no acute distress, vital signs are stable.  The patient's symptoms appear to be consistent with balanitis.  Will treat with nystatin 100,000 units/g cream to the penis twice daily until symptoms improve.  Patient was also prescribed fluconazole 150 mg tablets.  Supportive care recommendations were provided and discussed with the patient to include careful cleansing of the foreskin to include sitz bath and keeping the area clean and dry.  Patient's daughter was advised that if symptoms do not improve, recommend following up with urology for further evaluation.  Patient and daughter are in agreement with this plan of care and verbalized understanding.  All questions were answered.  Patient stable for discharge.  Final Clinical Impressions(s) / UC Diagnoses   Final diagnoses:  None   Discharge Instructions   None    ED Prescriptions   None    PDMP not reviewed this encounter.   Abran Cantor, NP 04/01/23 1444

## 2023-04-01 NOTE — Telephone Encounter (Signed)
Patient returned missed call. He is asking the office to call his daughter at [phone].

## 2023-04-01 NOTE — Discharge Instructions (Signed)
Take medication as prescribed. Cleanse the foreskin area thoroughly at least twice daily while symptoms persist. Keep the area clean and dry. May perform warm sits baths to help with cleansing. If symptoms do not improve with this treatment, please follow-up with urology for further evaluation. Follow-up as needed.

## 2023-04-01 NOTE — Telephone Encounter (Signed)
Daughter offered an apt with [facility] on 06/07.  She will call back to cancel if they decide to go to PCP for treatment.  Patient aware to disregard missed call, that we have been in contact with his daughter.

## 2023-04-05 ENCOUNTER — Ambulatory Visit: Payer: Medicare Other | Admitting: Urology

## 2023-05-06 DIAGNOSIS — X32XXXD Exposure to sunlight, subsequent encounter: Secondary | ICD-10-CM | POA: Diagnosis not present

## 2023-05-06 DIAGNOSIS — L570 Actinic keratosis: Secondary | ICD-10-CM | POA: Diagnosis not present

## 2023-05-06 DIAGNOSIS — I872 Venous insufficiency (chronic) (peripheral): Secondary | ICD-10-CM | POA: Diagnosis not present

## 2023-05-24 DIAGNOSIS — N3941 Urge incontinence: Secondary | ICD-10-CM | POA: Diagnosis not present

## 2023-05-24 DIAGNOSIS — R350 Frequency of micturition: Secondary | ICD-10-CM | POA: Diagnosis not present

## 2023-06-13 DIAGNOSIS — C61 Malignant neoplasm of prostate: Secondary | ICD-10-CM | POA: Diagnosis not present

## 2023-06-17 DIAGNOSIS — I872 Venous insufficiency (chronic) (peripheral): Secondary | ICD-10-CM | POA: Diagnosis not present

## 2023-06-21 DIAGNOSIS — R350 Frequency of micturition: Secondary | ICD-10-CM | POA: Diagnosis not present

## 2023-06-28 ENCOUNTER — Encounter: Payer: Self-pay | Admitting: Urology

## 2023-06-28 ENCOUNTER — Ambulatory Visit: Payer: Medicare Other | Admitting: Urology

## 2023-06-28 VITALS — BP 119/59 | HR 85

## 2023-06-28 DIAGNOSIS — Z8546 Personal history of malignant neoplasm of prostate: Secondary | ICD-10-CM

## 2023-06-28 DIAGNOSIS — N3941 Urge incontinence: Secondary | ICD-10-CM | POA: Diagnosis not present

## 2023-06-28 DIAGNOSIS — C61 Malignant neoplasm of prostate: Secondary | ICD-10-CM | POA: Diagnosis not present

## 2023-06-28 LAB — URINALYSIS, ROUTINE W REFLEX MICROSCOPIC
Bilirubin, UA: NEGATIVE
Glucose, UA: NEGATIVE
Ketones, UA: NEGATIVE
Nitrite, UA: NEGATIVE
Protein,UA: NEGATIVE
RBC, UA: NEGATIVE
Specific Gravity, UA: 1.01 (ref 1.005–1.030)
Urobilinogen, Ur: 0.2 mg/dL (ref 0.2–1.0)
pH, UA: 6 (ref 5.0–7.5)

## 2023-06-28 LAB — MICROSCOPIC EXAMINATION

## 2023-06-28 NOTE — Patient Instructions (Signed)

## 2023-06-28 NOTE — Progress Notes (Signed)
06/28/2023 12:22 PM   Tracy Pratt 1931-06-21 [id number]  Referring provider: Assunta Found, MD [address] [place],  [place] [address]  Followup prostate cancer   HPI: [name] is a 89yo here for followup for prostate cancer. His PSA increased to 6.6 from 5.6. He denies any worsening LUTS. He uses a cunningham clamp for his incontinence. He has a DVT in his left leg.    PMH: Past Medical History:  Diagnosis Date   Adrenal adenoma    Left   Arthritis    BPH (benign prostatic hyperplasia)    Cataract 2007, 2009   Bilateral   Colon polyps    Essential hypertension    Heart murmur    HX OF 30 YEARS AGO   Helicobacter pylori gastritis 09/09/2018   Malignant hyperthermia    Prostate cancer (HCC) 08/08/2007   Seed Implant and XRT   Spondylolisthesis    L5-S1    Surgical History: Past Surgical History:  Procedure Laterality Date   APPENDECTOMY  1940   BACK SURGERY  1979   BIOPSY  08/25/2018   Procedure: BIOPSY;  Surgeon: Iva Boop, MD;  Location: [facility];  Service: Endoscopy;;   CATARACT EXTRACTION  2007   COLONOSCOPY  2012   [facility]: single diminutive hyperplastic polyp in rectum   CORNEAL TRANSPLANT  2007   CRYOABLATION N/A 06/09/2021   Procedure: CRYO ABLATION PROSTATE;  Surgeon: Malen Gauze, MD;  Location: [facility];  Service: Urology;  Laterality: N/A;   ESOPHAGOGASTRODUODENOSCOPY (EGD) WITH PROPOFOL N/A 08/25/2018   Empiric dilation of esophagus, erythematous mucosa in antrum s/p biopsy, normal duodenum. Clotest positive.   KNEE SURGERY  1996   left knee arthoscopic   MALONEY DILATION  08/25/2018   Procedure: MALONEY DILATION;  Surgeon: Iva Boop, MD;  Location: [facility];  Service: Endoscopy;;   ROTATOR CUFF REPAIR  2015   Torn meniscus left knee  1996   TRANSURETHRAL RESECTION OF PROSTATE  1997    Home Medications:  Allergies as of 06/28/2023       Reactions   Diclofenac Sodium Anaphylaxis   Nabumetone  Anaphylaxis   Nsaids Anaphylaxis   Tolmetin Anaphylaxis   Bee Venom Swelling   Desflurane Other (See Comments)   Malignant hyperthermia   Enflurane Other (See Comments)   Malignant hyperthermia   Halothane Other (See Comments)   Malignant hyperthermia   Isoflurane Other (See Comments)   Malignant hyperthermia   Sevoflurane Other (See Comments)   Malignant hyperthermia   Succinylcholine Other (See Comments)   Malignant hyperthermia        Medication List        Accurate as of June 28, 2023 12:22 PM. If you have any questions, ask your nurse or doctor.          Apixaban Starter Pack (10mg  and 5mg ) Commonly known as: ELIQUIS STARTER PACK Take as directed on package: start with two-5mg  tablets twice daily for 7 days. On day 8, switch to one-5mg  tablet twice daily.   doxycycline 100 MG capsule Commonly known as: VIBRAMYCIN Take 1 capsule (100 mg total) by mouth every 12 (twelve) hours.   fluconazole 150 MG tablet Commonly known as: Diflucan Take 1 tablet by mouth today.  May repeat every 72 hours up to 2 additional doses as needed.   furosemide 40 MG tablet Commonly known as: LASIX Take 40 mg by mouth daily as needed.   HYDROcodone-acetaminophen 7.5-325 MG tablet Commonly known as: NORCO TAKE 1  TABLET EVERY 4 HOURS AS NEEDED FOR PAIN.   levocetirizine 5 MG tablet Commonly known as: XYZAL Take 5 mg by mouth daily.   lisinopril-hydrochlorothiazide 10-12.5 MG tablet Commonly known as: ZESTORETIC Take 1 tablet by mouth daily.   loteprednol 0.5 % ophthalmic suspension Commonly known as: LOTEMAX Place 1 drop into the right eye at bedtime.   nystatin-triamcinolone ointment Commonly known as: MYCOLOG Apply twice daily until symptoms improve.   oxybutynin 10 MG 24 hr tablet Commonly known as: DITROPAN-XL Take 10 mg by mouth daily.   prednisoLONE acetate 1 % ophthalmic suspension Commonly known as: PRED FORTE Place 1 drop into the left eye 2 (two) times  daily.   SYSTANE OP Apply 1 drop to eye 2 (two) times daily.   valACYclovir 1000 MG tablet Commonly known as: VALTREX Take 1,000 mg by mouth daily.   Vitamin D (Ergocalciferol) 1.25 MG (50000 UNIT) Caps capsule Commonly known as: DRISDOL Take by mouth.        Allergies:  Allergies  Allergen Reactions   Diclofenac Sodium Anaphylaxis   Nabumetone Anaphylaxis   Nsaids Anaphylaxis   Tolmetin Anaphylaxis   Bee Venom Swelling   Desflurane Other (See Comments)    Malignant hyperthermia   Enflurane Other (See Comments)    Malignant hyperthermia   Halothane Other (See Comments)    Malignant hyperthermia   Isoflurane Other (See Comments)    Malignant hyperthermia   Sevoflurane Other (See Comments)    Malignant hyperthermia   Succinylcholine Other (See Comments)    Malignant hyperthermia    Family History: Family History  Problem Relation Age of Onset   Prostate cancer Brother    Lung disease Brother        lobectomy    Social History:  reports that he has quit smoking. His smoking use included cigarettes. He has a 15 pack-year smoking history. He has never used smokeless tobacco. He reports that he does not drink alcohol and does not use drugs.  ROS: All other review of systems were reviewed and are negative except what is noted above in HPI  Physical Exam: BP (!) 119/59   Pulse 85   Constitutional:  Alert and oriented, No acute distress. HEENT: [place] AT, moist mucus membranes.  Trachea midline, no masses. Cardiovascular: No clubbing, cyanosis, or edema. Respiratory: Normal respiratory effort, no increased work of breathing. GI: Abdomen is soft, nontender, nondistended, no abdominal masses GU: No CVA tenderness.  Lymph: No cervical or inguinal lymphadenopathy. Skin: No rashes, bruises or suspicious lesions. Neurologic: Grossly intact, no focal deficits, moving all 4 extremities. Psychiatric: Normal mood and affect.  Laboratory Data: Lab Results  Component Value  Date   WBC 9.0 12/02/2022   HGB 15.4 12/02/2022   HCT 45.5 12/02/2022   MCV 97.8 12/02/2022   PLT 189 12/02/2022    Lab Results  Component Value Date   CREATININE 1.36 (H) 12/02/2022    No results found for: "PSA"  No results found for: "TESTOSTERONE"  Lab Results  Component Value Date   HGBA1C 5.8 07/02/2017    Urinalysis    Component Value Date/Time   COLORURINE YELLOW 05/13/2022 1535   APPEARANCEUR Clear 03/15/2023 1332   LABSPEC 1.009 05/13/2022 1535   PHURINE 6.0 05/13/2022 1535   GLUCOSEU Negative 03/15/2023 1332   HGBUR SMALL (A) 05/13/2022 1535   BILIRUBINUR Negative 03/15/2023 1332   KETONESUR NEGATIVE 05/13/2022 1535   PROTEINUR Negative 03/15/2023 1332   PROTEINUR NEGATIVE 05/13/2022 1535   UROBILINOGEN 0.2 09/25/2014 1525  NITRITE Negative 03/15/2023 1332   NITRITE NEGATIVE 05/13/2022 1535   LEUKOCYTESUR Trace (A) 03/15/2023 1332   LEUKOCYTESUR MODERATE (A) 05/13/2022 1535    Lab Results  Component Value Date   LABMICR See below: 03/15/2023   WBCUA 0-5 03/15/2023   LABEPIT 0-10 03/15/2023   BACTERIA None seen 03/15/2023    Pertinent Imaging:  No results found for this or any previous visit.  No results found for this or any previous visit.  No results found for this or any previous visit.  No results found for this or any previous visit.  No results found for this or any previous visit.  No valid procedures specified. No results found for this or any previous visit.  No results found for this or any previous visit.   Assessment & Plan:    1. Prostate cancer (HCC) Continue surveillance. Followup 3 months with PSA - Urinalysis, Routine w reflex microscopic  2. Urge incontinence -continue cunningham clamp.    No follow-ups on file.  Wilkie Aye, MD  [facility] [place]

## 2023-07-02 ENCOUNTER — Other Ambulatory Visit: Payer: Self-pay | Admitting: *Deleted

## 2023-07-02 DIAGNOSIS — M79606 Pain in leg, unspecified: Secondary | ICD-10-CM

## 2023-07-10 ENCOUNTER — Ambulatory Visit (INDEPENDENT_AMBULATORY_CARE_PROVIDER_SITE_OTHER): Payer: Medicare Other | Admitting: Vascular Surgery

## 2023-07-10 ENCOUNTER — Encounter: Payer: Self-pay | Admitting: Vascular Surgery

## 2023-07-10 ENCOUNTER — Ambulatory Visit (HOSPITAL_COMMUNITY)
Admission: RE | Admit: 2023-07-10 | Discharge: 2023-07-10 | Disposition: A | Discharge status: Home / Self Care | Payer: Medicare Other | Source: Ambulatory Visit | Attending: Vascular Surgery | Admitting: Vascular Surgery

## 2023-07-10 VITALS — BP 129/76 | HR 58 | Temp 98.2°F | Resp 20 | Ht 69.5 in | Wt 219.0 lb

## 2023-07-10 DIAGNOSIS — I87002 Postthrombotic syndrome without complications of left lower extremity: Secondary | ICD-10-CM | POA: Diagnosis not present

## 2023-07-10 DIAGNOSIS — M79605 Pain in left leg: Secondary | ICD-10-CM | POA: Diagnosis not present

## 2023-07-10 DIAGNOSIS — M7989 Other specified soft tissue disorders: Secondary | ICD-10-CM

## 2023-07-10 DIAGNOSIS — M79606 Pain in leg, unspecified: Secondary | ICD-10-CM | POA: Insufficient documentation

## 2023-07-10 NOTE — Progress Notes (Signed)
Patient ID: Tracy Pratt, male   DOB: Oct 22, 1931, 89 y.o.   MRN: [id number]  Reason for Consult: Follow-up   Referred by Assunta Found, MD  Subjective:     HPI:  Tracy Pratt is a 89 y.o. male has a history of extensive DVT earlier this year including the femoral vein down into the calf veins.  At last visit he had 10% edema.  He states that the edema persists.  He does not wear compression stockings and elevates his leg twice a day for approximately 30 minutes.  He continues on Eliquis.  He has attempted to be active but does not walk much in the summer months.  States that his leg really does not bother him other than just the edema itself but does not have any heaviness, itching, cellulitis or rashes.  Past Medical History:  Diagnosis Date   Adrenal adenoma    Left   Arthritis    BPH (benign prostatic hyperplasia)    Cataract 2007, 2009   Bilateral   Colon polyps    DVT (deep venous thrombosis) (HCC)    Essential hypertension    Heart murmur    HX OF 30 YEARS AGO   Helicobacter pylori gastritis 09/09/2018   Malignant hyperthermia    Prostate cancer (HCC) 08/08/2007   Seed Implant and XRT   Spondylolisthesis    L5-S1   Family History  Problem Relation Age of Onset   Prostate cancer Brother    Lung disease Brother        lobectomy   Past Surgical History:  Procedure Laterality Date   APPENDECTOMY  1940   BACK SURGERY  1979   BIOPSY  08/25/2018   Procedure: BIOPSY;  Surgeon: Iva Boop, MD;  Location: [facility];  Service: Endoscopy;;   CATARACT EXTRACTION  2007   COLONOSCOPY  2012   [facility]: single diminutive hyperplastic polyp in rectum   CORNEAL TRANSPLANT  2007   CRYOABLATION N/A 06/09/2021   Procedure: CRYO ABLATION PROSTATE;  Surgeon: Malen Gauze, MD;  Location: [facility];  Service: Urology;  Laterality: N/A;   ESOPHAGOGASTRODUODENOSCOPY (EGD) WITH PROPOFOL N/A 08/25/2018   Empiric dilation of esophagus, erythematous mucosa in antrum  s/p biopsy, normal duodenum. Clotest positive.   KNEE SURGERY  1996   left knee arthoscopic   MALONEY DILATION  08/25/2018   Procedure: MALONEY DILATION;  Surgeon: Iva Boop, MD;  Location: [facility];  Service: Endoscopy;;   ROTATOR CUFF REPAIR  2015   Torn meniscus left knee  1996   TRANSURETHRAL RESECTION OF PROSTATE  1997    Short Social History:  Social History   Tobacco Use   Smoking status: Former    Current packs/day: 1.00    Average packs/day: 1 pack/day for 15.0 years (15.0 ttl pk-yrs)    Types: Cigarettes   Smokeless tobacco: Never   Tobacco comments:    quit 28 years ago  Substance Use Topics   Alcohol use: No    Allergies  Allergen Reactions   Diclofenac Sodium Anaphylaxis   Nabumetone Anaphylaxis   Nsaids Anaphylaxis   Tolmetin Anaphylaxis   Bee Venom Swelling   Desflurane Other (See Comments)    Malignant hyperthermia   Enflurane Other (See Comments)    Malignant hyperthermia   Halothane Other (See Comments)    Malignant hyperthermia   Isoflurane Other (See Comments)    Malignant hyperthermia   Sevoflurane Other (See Comments)    Malignant hyperthermia  Succinylcholine Other (See Comments)    Malignant hyperthermia    Current Outpatient Medications  Medication Sig Dispense Refill   ELIQUIS 5 MG TABS tablet Take 5 mg by mouth 2 (two) times daily.     furosemide (LASIX) 40 MG tablet Take 40 mg by mouth daily as needed.     HYDROcodone-acetaminophen (NORCO) 7.5-325 MG tablet TAKE 1 TABLET EVERY 4 HOURS AS NEEDED FOR PAIN. 60 tablet 0   levocetirizine (XYZAL) 5 MG tablet Take 5 mg by mouth daily.     lisinopril-hydrochlorothiazide (PRINZIDE,ZESTORETIC) 10-12.5 MG tablet Take 1 tablet by mouth daily.     loteprednol (LOTEMAX) 0.5 % ophthalmic suspension Place 1 drop into the right eye at bedtime.     oxybutynin (DITROPAN-XL) 10 MG 24 hr tablet Take 10 mg by mouth daily.     Polyethyl Glycol-Propyl Glycol (SYSTANE OP) Apply 1 drop to eye 2  (two) times daily.     prednisoLONE acetate (PRED FORTE) 1 % ophthalmic suspension Place 1 drop into the left eye 2 (two) times daily.     valACYclovir (VALTREX) 1000 MG tablet Take 1,000 mg by mouth daily.     Vitamin D, Ergocalciferol, (DRISDOL) 1.25 MG (50000 UNIT) CAPS capsule Take by mouth.     No current facility-administered medications for this visit.    Review of Systems  Constitutional:  Constitutional negative. HENT: HENT negative.  Eyes: Eyes negative.  Respiratory: Respiratory negative.  Cardiovascular: Positive for leg swelling.  GI: Gastrointestinal negative.  Musculoskeletal: Positive for leg pain.  Neurological: Neurological negative. Psychiatric: Psychiatric negative.        Objective:  Objective   Vitals:   07/10/23 1511  BP: 129/76  Pulse: (!) 58  Resp: 20  Temp: 98.2 F (36.8 C)  SpO2: 97%  Weight: 219 lb (99.3 kg)  Height: 5' 9.5" (1.765 m)   Body mass index is 31.88 kg/m.  Physical Exam HENT:     Head: Normocephalic.     Nose: Nose normal.  Eyes:     Pupils: Pupils are equal, round, and reactive to light.  Cardiovascular:     Rate and Rhythm: Normal rate.     Pulses: Normal pulses.  Pulmonary:     Effort: Pulmonary effort is normal.  Abdominal:     General: Abdomen is flat.     Palpations: Abdomen is soft.  Musculoskeletal:     Cervical back: Normal range of motion and neck supple.     Right lower leg: No edema.     Left lower leg: Edema present.     Comments: Multiple varicosities of the right lower extremity Right lower extremity measures 36.5 cm/left lower extremity 44 cm (measured 10 cm from tibial tuberosities)  Skin:    Findings: No rash.  Neurological:     General: No focal deficit present.     Mental Status: He is alert.  Psychiatric:        Mood and Affect: Mood normal.        Thought Content: Thought content normal.        Judgment: Judgment normal.     Data: LEFT          Reflux NoRefluxReflux TimeDiameter  cmsComments                                  Yes                                            +--------------+---------+------+-----------+------------+----------------+  CFV                    yes   >1 second                                +--------------+---------+------+-----------+------------+----------------+   FV prox                 yes   >1 second                                +--------------+---------+------+-----------+------------+----------------+   FV mid                  yes   >1 second                                +--------------+---------+------+-----------+------------+----------------+   FV dist                                             chronic  thrombus  +--------------+---------+------+-----------+------------+----------------+   Popliteal              yes   >1 second             chronic  thrombus  +--------------+---------+------+-----------+------------+----------------+   GSV at SFJ              yes    >500 ms     0.102                       +--------------+---------+------+-----------+------------+----------------+   GSV prox thighno                            0.65                       +--------------+---------+------+-----------+------------+----------------+   GSV mid thigh no                           0.444    out of fascia      +--------------+---------+------+-----------+------------+----------------+   GSV dist thighno                           0.456                       +--------------+---------+------+-----------+------------+----------------+   GSV at knee   no                           0.328                       +--------------+---------+------+-----------+------------+----------------+   GSV prox calf no                           0.316                       +--------------+---------+------+-----------+------------+----------------+   SSV  Pop Fossa  yes    >500 ms     0.402                       +--------------+---------+------+-----------+------------+----------------+   SSV prox calf           yes    >500 ms     0.379                       +--------------+---------+------+-----------+------------+----------------+   SSV mid calf            yes    >500 ms     0.269                       +--------------+---------+------+-----------+------------+----------------+          Summary:  Left:  - 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  - No evidence of superficial vein thrombus.   - Deep vein reflux in the CFV, FV, and popliteal vein.   - Superficial vein reflux in the SSV and the SFJ.       Assessment/Plan:    89 year old male with significant edema of the left lower extremity with approximately 20% increase in the size of the left relative for right with significant pitting of the edema.  Patient really quite asymptomatic at this time has not had any cellulitis episodes.  I have recommended leg elevation and we discussed correct manner which to elevate his legs as well as compression stockings.  We also discussed maybe a candidate for lymphedema pump if he fails conservative measures.  He will call or follow-up as updated.     Maeola Harman MD [facility]

## 2023-07-19 DIAGNOSIS — R350 Frequency of micturition: Secondary | ICD-10-CM | POA: Diagnosis not present

## 2023-08-07 DIAGNOSIS — Z23 Encounter for immunization: Secondary | ICD-10-CM | POA: Diagnosis not present

## 2023-08-16 DIAGNOSIS — R350 Frequency of micturition: Secondary | ICD-10-CM | POA: Diagnosis not present

## 2023-08-16 DIAGNOSIS — N3941 Urge incontinence: Secondary | ICD-10-CM | POA: Diagnosis not present

## 2023-08-20 ENCOUNTER — Ambulatory Visit
Admission: RE | Admit: 2023-08-20 | Discharge: 2023-08-20 | Disposition: A | Discharge status: Home / Self Care | Payer: Medicare Other | Source: Ambulatory Visit | Attending: Nurse Practitioner | Admitting: Nurse Practitioner

## 2023-08-20 VITALS — BP 118/68 | HR 96 | Temp 98.5°F | Resp 16

## 2023-08-20 DIAGNOSIS — J019 Acute sinusitis, unspecified: Secondary | ICD-10-CM | POA: Diagnosis not present

## 2023-08-20 MED ORDER — AZELASTINE-FLUTICASONE 137-50 MCG/ACT NA SUSP: 1.0000 | Freq: Every day | NASAL | 0 refills | Status: AC

## 2023-08-20 MED ORDER — AMOXICILLIN-POT CLAVULANATE 875-125 MG PO TABS
1.0000 | ORAL_TABLET | Freq: Two times a day (BID) | ORAL | 0 refills | Status: AC
Start: 2023-08-20 — End: 2023-08-27

## 2023-08-20 NOTE — ED Provider Notes (Addendum)
[facility]    CSN: [id number] Arrival date & time: 08/20/23  1046      History   Chief Complaint Chief Complaint  Patient presents with   URI    Head congestion - Entered by patient    HPI Tracy Pratt is a 89 y.o. male.   Patient presents today with 1 month history of upper airway congestion and sinus pressure.  He reports occasional congested cough and he coughs up a little bit of clear mucus.  Has also occasional nasal congestion and when he is blows his nose he is thick nasal drainage that is discolored.  Reports 2 weeks ago, he had a runny nose but this is now improved.  No recent sneezing or itchy nose.  He denies fever, bodyaches or chills, shortness of breath or chest pain, ear pain, abdominal pain, nausea/vomiting, diarrhea.  No headache, change in appetite, or change in energy levels.  No known sick contacts.  Patient reports he takes Xyzal daily for allergies.  He started taking a nasal spray but does not remember the name 3 days ago and is unsure if this is helping much.     Past Medical History:  Diagnosis Date   Adrenal adenoma    Left   Arthritis    BPH (benign prostatic hyperplasia)    Cataract 2007, 2009   Bilateral   Colon polyps    DVT (deep venous thrombosis) (HCC)    Essential hypertension    Heart murmur    HX OF 30 YEARS AGO   Helicobacter pylori gastritis 09/09/2018   Malignant hyperthermia    Prostate cancer (HCC) 08/08/2007   Seed Implant and XRT   Spondylolisthesis    L5-S1    Patient Active Problem List   Diagnosis Date Noted   Deep vein blood clot of left lower extremity (HCC) 12/02/2022   Syncope 05/13/2022   Scalp laceration 05/13/2022   AKI (acute kidney injury) (HCC) 05/13/2022   Hyperglycemia 05/13/2022   Obesity (BMI 30-39.9) 05/13/2022   Urge incontinence 05/13/2022   History of prostate cancer 05/13/2022   Pancreatic lesion 03/24/2020   Bilateral hand pain 02/29/2020   Dupuytren's contracture of both  hands 02/29/2020   Helicobacter pylori gastritis 09/09/2018   Parotid swelling 09/08/2018   Esophageal dysphagia    Prediabetes 08/19/2017   Subclinical hypothyroidism 08/19/2017   Adrenal nodule (HCC) 08/19/2017   Hypoglycemia 08/14/2017   Piriformis syndrome of right side 07/29/2017   Spondylolisthesis at L5-S1 level 12/13/2015   Midline low back pain without sciatica 12/13/2015   H/O repair of left rotator cuff 04/19/2014   Pain in joint, shoulder region 03/23/2014   Muscle weakness (generalized) 03/23/2014   Decreased range of motion of left shoulder 03/23/2014   Essential hypertension 02/23/2014   Torn rotator cuff 02/09/2014   Prostate cancer (HCC) 10/31/2011   Spondylolisthesis    BPH (benign prostatic hyperplasia)    Malignant hyperthermia    Adrenal adenoma     Past Surgical History:  Procedure Laterality Date   APPENDECTOMY  1940   BACK SURGERY  1979   BIOPSY  08/25/2018   Procedure: BIOPSY;  Surgeon: Iva Boop, MD;  Location: [facility];  Service: Endoscopy;;   CATARACT EXTRACTION  2007   COLONOSCOPY  2012   [facility]: single diminutive hyperplastic polyp in rectum   CORNEAL TRANSPLANT  2007   CRYOABLATION N/A 06/09/2021   Procedure: CRYO ABLATION PROSTATE;  Surgeon: Malen Gauze, MD;  Location: [facility];  Service: Urology;  Laterality: N/A;   ESOPHAGOGASTRODUODENOSCOPY (EGD) WITH PROPOFOL N/A 08/25/2018   Empiric dilation of esophagus, erythematous mucosa in antrum s/p biopsy, normal duodenum. Clotest positive.   KNEE SURGERY  1996   left knee arthoscopic   MALONEY DILATION  08/25/2018   Procedure: MALONEY DILATION;  Surgeon: Iva Boop, MD;  Location: [facility];  Service: Endoscopy;;   ROTATOR CUFF REPAIR  2015   Torn meniscus left knee  1996   TRANSURETHRAL RESECTION OF PROSTATE  1997       Home Medications    Prior to Admission medications   Medication Sig Start Date End Date Taking? Authorizing Provider   amoxicillin-clavulanate (AUGMENTIN) 875-125 MG tablet Take 1 tablet by mouth 2 (two) times daily for 7 days. 08/20/23 08/27/23 Yes Valentino Nose, NP  Azelastine-Fluticasone 137-50 MCG/ACT SUSP Place 1 spray into the nose daily. 08/20/23  Yes Cathlean Marseilles A, NP  ELIQUIS 5 MG TABS tablet Take 5 mg by mouth 2 (two) times daily. 06/24/23   [provider]  furosemide (LASIX) 40 MG tablet Take 40 mg by mouth daily as needed. 05/28/22   [provider]  HYDROcodone-acetaminophen (NORCO) 7.5-325 MG tablet TAKE 1 TABLET EVERY 4 HOURS AS NEEDED FOR PAIN. 02/11/23   Darreld Mclean, MD  levocetirizine (XYZAL) 5 MG tablet Take 5 mg by mouth daily. 02/14/21   [provider]  lisinopril-hydrochlorothiazide (PRINZIDE,ZESTORETIC) 10-12.5 MG tablet Take 1 tablet by mouth daily.    [provider]  loteprednol (LOTEMAX) 0.5 % ophthalmic suspension Place 1 drop into the right eye at bedtime.    [provider]  oxybutynin (DITROPAN-XL) 10 MG 24 hr tablet Take 10 mg by mouth daily. 04/21/22   [provider]  Polyethyl Glycol-Propyl Glycol (SYSTANE OP) Apply 1 drop to eye 2 (two) times daily.    [provider]  prednisoLONE acetate (PRED FORTE) 1 % ophthalmic suspension Place 1 drop into the left eye 2 (two) times daily. 01/18/21   [provider]  valACYclovir (VALTREX) 1000 MG tablet Take 1,000 mg by mouth daily. 04/28/22   [provider]  Vitamin D, Ergocalciferol, (DRISDOL) 1.25 MG (50000 UNIT) CAPS capsule Take by mouth. 05/29/22   [provider]    Family History Family History  Problem Relation Age of Onset   Prostate cancer Brother    Lung disease Brother        lobectomy    Social History Social History   Tobacco Use   Smoking status: Former    Current packs/day: 1.00    Average packs/day: 1 pack/day for 15.0 years (15.0 ttl pk-yrs)    Types: Cigarettes   Smokeless tobacco: Never   Tobacco comments:     quit 28 years ago  Vaping Use   Vaping status: Never Used  Substance Use Topics   Alcohol use: No   Drug use: No     Allergies   Diclofenac sodium, Nabumetone, Nsaids, Tolmetin, Bee venom, Desflurane, Enflurane, Halothane, Isoflurane, Sevoflurane, and Succinylcholine   Review of Systems Review of Systems Per HPI  Physical Exam Triage Vital Signs ED Triage Vitals  Encounter Vitals Group     BP 08/20/23 1056 118/68     Systolic BP Percentile --      Diastolic BP Percentile --      Pulse Rate 08/20/23 1056 96     Resp 08/20/23 1056 16     Temp 08/20/23 1056 98.5 F (36.9 C)     Temp Source  08/20/23 1056 Oral     SpO2 08/20/23 1056 98 %     Weight --      Height --      Head Circumference --      Peak Flow --      Pain Score 08/20/23 1058 0     Pain Loc --      Pain Education --      Exclude from Growth Chart --    No data found.  Updated Vital Signs BP 118/68 (BP Location: Right Arm)   Pulse 96   Temp 98.5 F (36.9 C) (Oral)   Resp 16   SpO2 98%   Visual Acuity Right Eye Distance:   Left Eye Distance:   Bilateral Distance:    Right Eye Near:   Left Eye Near:    Bilateral Near:     Physical Exam Vitals and nursing note reviewed.  Constitutional:      General: He is not in acute distress.    Appearance: Normal appearance. He is not ill-appearing or toxic-appearing.  HENT:     Head: Normocephalic and atraumatic.     Right Ear: Tympanic membrane, ear canal and external ear normal.     Left Ear: Tympanic membrane, ear canal and external ear normal.     Nose: Congestion present. No rhinorrhea.     Mouth/Throat:     Mouth: Mucous membranes are moist.     Pharynx: Oropharynx is clear. Posterior oropharyngeal erythema present. No oropharyngeal exudate.  Eyes:     General: No scleral icterus.    Extraocular Movements: Extraocular movements intact.  Cardiovascular:     Rate and Rhythm: Normal rate and regular rhythm.  Pulmonary:     Effort: Pulmonary  effort is normal. No respiratory distress.     Breath sounds: Normal breath sounds. No wheezing, rhonchi or rales.  Musculoskeletal:     Cervical back: Normal range of motion and neck supple.  Lymphadenopathy:     Cervical: No cervical adenopathy.  Skin:    General: Skin is warm and dry.     Coloration: Skin is not jaundiced or pale.     Findings: No erythema or rash.  Neurological:     Mental Status: He is alert and oriented to person, place, and time.  Psychiatric:        Behavior: Behavior is cooperative.      UC Treatments / Results  Labs (all labs ordered are listed, but only abnormal results are displayed) Labs Reviewed - No data to display  EKG   Radiology No results found.  Procedures Procedures (including critical care time)  Medications Ordered in UC Medications - No data to display  Initial Impression / Assessment and Plan / UC Course  I have reviewed the triage vital signs and the nursing notes.  Pertinent labs & imaging results that were available during my care of the patient were reviewed by me and considered in my medical decision making (see chart for details).   Patient is well-appearing, normotensive, afebrile, not tachycardic, not tachypneic, oxygenating well on room air.    1. Acute non-recurrent sinusitis, unspecified location Bacterial versus allergic sinusitis No red flags; vitals and exam are reassuring Recent creatinine clearance calculated from GFR in February 2024; creatinine clearance greater than 50 therefore renal dosing of Augmentin not needed Treat with Augmentin twice daily for 7 days given length of symptoms, also recommended starting antihistamine and glucocorticoid nasal spray and prescription sent to pharmacy Recommended following up with PCP  if symptoms do not improve with treatment  The patient was given the opportunity to ask questions.  All questions answered to their satisfaction.  The patient is in agreement to this plan.     Final Clinical Impressions(s) / UC Diagnoses   Final diagnoses:  Acute non-recurrent sinusitis, unspecified location     Discharge Instructions      Start using the nasal spray that has been sent to the pharmacy to help with the congestion.  Also take the Augmentin (antibiotic) as prescribed to treat any bacteria that is causing your symptoms.  Seek care if symptoms do not fully improve with treatment.   ED Prescriptions     Medication Sig Dispense Auth. Provider   Azelastine-Fluticasone 137-50 MCG/ACT SUSP Place 1 spray into the nose daily. 23 g Cathlean Marseilles A, NP   amoxicillin-clavulanate (AUGMENTIN) 875-125 MG tablet Take 1 tablet by mouth 2 (two) times daily for 7 days. 14 tablet Valentino Nose, NP      PDMP not reviewed this encounter.   Valentino Nose, NP 08/20/23 1130    Valentino Nose, NP 08/20/23 1131

## 2023-08-20 NOTE — ED Triage Notes (Signed)
Pt c/o head congestion, x 1 month seems to be about the same, states seldomly he's able to cough up some mucus but mostly feel like it is stuck in his throat

## 2023-08-20 NOTE — Discharge Instructions (Addendum)
Start using the nasal spray that has been sent to the pharmacy to help with the congestion.  Also take the Augmentin (antibiotic) as prescribed to treat any bacteria that is causing your symptoms.  Seek care if symptoms do not fully improve with treatment.

## 2023-09-09 ENCOUNTER — Encounter: Payer: Self-pay | Admitting: Vascular Surgery

## 2023-09-11 ENCOUNTER — Ambulatory Visit (INDEPENDENT_AMBULATORY_CARE_PROVIDER_SITE_OTHER): Payer: Medicare Other | Admitting: Orthopaedic Surgery

## 2023-09-11 ENCOUNTER — Encounter: Payer: Self-pay | Admitting: Orthopaedic Surgery

## 2023-09-11 DIAGNOSIS — G8929 Other chronic pain: Secondary | ICD-10-CM | POA: Diagnosis not present

## 2023-09-11 DIAGNOSIS — M25512 Pain in left shoulder: Secondary | ICD-10-CM | POA: Diagnosis not present

## 2023-09-11 MED ORDER — METHYLPREDNISOLONE ACETATE 40 MG/ML IJ SUSP
40.0000 mg | Freq: Once | INTRAMUSCULAR | Status: AC
Start: 2023-09-11 — End: 2023-09-11
  Administered 2023-09-11: 40 mg via INTRA_ARTICULAR

## 2023-09-11 MED ORDER — PREDNISONE 5 MG (21) PO TBPK: ORAL_TABLET | ORAL | 0 refills | Status: DC

## 2023-09-11 MED ORDER — HYDROCODONE-ACETAMINOPHEN 7.5-325 MG PO TABS: ORAL_TABLET | ORAL | 0 refills | Status: DC

## 2023-09-11 NOTE — Progress Notes (Signed)
PROCEDURE NOTE:  The patient request injection, verbal consent was obtained.  The left shoulder was prepped appropriately after time out was performed.   Sterile technique was observed and injection of 1 cc of DepoMedrol 40mg  with several cc's of plain xylocaine. Anesthesia was provided by ethyl chloride and a 20-gauge needle was used to inject the shoulder area. A posterior approach was used.  The injection was tolerated well.  A band aid dressing was applied.  The patient was advised to apply ice later today and tomorrow to the injection sight as needed.  Encounter Diagnosis  Name Primary?   Chronic left shoulder pain Yes   I have reviewed the [place] Controlled Substance Reporting System web site prior to prescribing narcotic medicine for this patient.  I will give prednisone dose pack for his right hip and lower back pain.  He is on Eliquis and cannot take NSAIDs.  Return in two weeks.  Call if any problem.  Precautions discussed.  Electronically Signed Darreld Mclean, MD 11/13/20249:56 AM

## 2023-09-11 NOTE — Addendum Note (Signed)
Addended by: Michaele Offer on: 09/11/2023 10:53 AM   Modules accepted: Orders

## 2023-09-13 DIAGNOSIS — R350 Frequency of micturition: Secondary | ICD-10-CM | POA: Diagnosis not present

## 2023-09-23 DIAGNOSIS — Z947 Corneal transplant status: Secondary | ICD-10-CM | POA: Diagnosis not present

## 2023-09-24 ENCOUNTER — Other Ambulatory Visit: Payer: Medicare Other

## 2023-09-24 DIAGNOSIS — C61 Malignant neoplasm of prostate: Secondary | ICD-10-CM

## 2023-09-25 ENCOUNTER — Ambulatory Visit (INDEPENDENT_AMBULATORY_CARE_PROVIDER_SITE_OTHER): Payer: Medicare Other | Admitting: Orthopaedic Surgery

## 2023-09-25 ENCOUNTER — Encounter: Payer: Self-pay | Admitting: Orthopaedic Surgery

## 2023-09-25 VITALS — BP 155/84 | HR 68 | Ht 69.5 in | Wt 219.0 lb

## 2023-09-25 DIAGNOSIS — M545 Low back pain, unspecified: Secondary | ICD-10-CM

## 2023-09-25 DIAGNOSIS — G8929 Other chronic pain: Secondary | ICD-10-CM

## 2023-09-25 DIAGNOSIS — M25512 Pain in left shoulder: Secondary | ICD-10-CM

## 2023-09-25 LAB — PSA: Prostate Specific Ag, Serum: 7 ng/mL — ABNORMAL HIGH (ref 0.0–4.0)

## 2023-09-25 MED ORDER — METHYLPREDNISOLONE ACETATE 40 MG/ML IJ SUSP
40.0000 mg | Freq: Once | INTRAMUSCULAR | Status: AC
Start: 2023-09-25 — End: 2023-09-25
  Administered 2023-09-25: 40 mg via INTRA_ARTICULAR

## 2023-09-25 NOTE — Progress Notes (Signed)
PROCEDURE NOTE:  The patient request injection, verbal consent was obtained.  The left shoulder was prepped appropriately after time out was performed.   Sterile technique was observed and injection of 1 cc of DepoMedrol 40mg  with several cc's of plain xylocaine. Anesthesia was provided by ethyl chloride and a 20-gauge needle was used to inject the shoulder area. A posterior approach was used.  The injection was tolerated well.  A band aid dressing was applied.  The patient was advised to apply ice later today and tomorrow to the injection sight as needed.   Encounter Diagnoses  Name Primary?   Chronic left shoulder pain Yes   Lumbar pain    Return in three weeks.  His back and right hip is much improved.  Call if any problem.  Precautions discussed.  Electronically Signed Darreld Mclean, MD 11/27/20242:02 PM

## 2023-10-04 ENCOUNTER — Encounter: Payer: Self-pay | Admitting: Urology

## 2023-10-04 ENCOUNTER — Ambulatory Visit: Payer: Medicare Other | Admitting: Urology

## 2023-10-04 VITALS — BP 132/74 | HR 97

## 2023-10-04 DIAGNOSIS — N3941 Urge incontinence: Secondary | ICD-10-CM

## 2023-10-04 DIAGNOSIS — C61 Malignant neoplasm of prostate: Secondary | ICD-10-CM

## 2023-10-04 DIAGNOSIS — Z8546 Personal history of malignant neoplasm of prostate: Secondary | ICD-10-CM

## 2023-10-04 LAB — URINALYSIS, ROUTINE W REFLEX MICROSCOPIC
Bilirubin, UA: NEGATIVE
Glucose, UA: NEGATIVE
Ketones, UA: NEGATIVE
Nitrite, UA: NEGATIVE
Protein,UA: NEGATIVE
RBC, UA: NEGATIVE
Specific Gravity, UA: 1.01 (ref 1.005–1.030)
Urobilinogen, Ur: 0.2 mg/dL (ref 0.2–1.0)
pH, UA: 6.5 (ref 5.0–7.5)

## 2023-10-04 LAB — MICROSCOPIC EXAMINATION
Bacteria, UA: NONE SEEN
RBC, Urine: NONE SEEN /[HPF] (ref 0–2)

## 2023-10-04 NOTE — Patient Instructions (Signed)

## 2023-10-04 NOTE — Progress Notes (Unsigned)
10/04/2023 12:05 PM   Lucilla Lame Jr Feb 09, 1931 [id number]  Referring provider: Assunta Found, MD [address] [place],  [place] [address]  Followup prostate cancer   HPI: Tracy Pratt is a 89yo here for followup for prostate cancer. PSA increased to 7.0 from 6.6. No new bone pain. He uses a penile clamp to improve his incontinence. No other compalints today   PMH: Past Medical History:  Diagnosis Date   Adrenal adenoma    Left   Arthritis    BPH (benign prostatic hyperplasia)    Cataract 2007, 2009   Bilateral   Colon polyps    DVT (deep venous thrombosis) (HCC)    Essential hypertension    Heart murmur    HX OF 30 YEARS AGO   Helicobacter pylori gastritis 09/09/2018   Malignant hyperthermia    Prostate cancer (HCC) 08/08/2007   Seed Implant and XRT   Spondylolisthesis    L5-S1    Surgical History: Past Surgical History:  Procedure Laterality Date   APPENDECTOMY  1940   BACK SURGERY  1979   BIOPSY  08/25/2018   Procedure: BIOPSY;  Surgeon: Iva Boop, MD;  Location: [facility];  Service: Endoscopy;;   CATARACT EXTRACTION  2007   COLONOSCOPY  2012   [facility]: single diminutive hyperplastic polyp in rectum   CORNEAL TRANSPLANT  2007   CRYOABLATION N/A 06/09/2021   Procedure: CRYO ABLATION PROSTATE;  Surgeon: Malen Gauze, MD;  Location: [facility];  Service: Urology;  Laterality: N/A;   ESOPHAGOGASTRODUODENOSCOPY (EGD) WITH PROPOFOL N/A 08/25/2018   Empiric dilation of esophagus, erythematous mucosa in antrum s/p biopsy, normal duodenum. Clotest positive.   KNEE SURGERY  1996   left knee arthoscopic   MALONEY DILATION  08/25/2018   Procedure: MALONEY DILATION;  Surgeon: Iva Boop, MD;  Location: [facility];  Service: Endoscopy;;   ROTATOR CUFF REPAIR  2015   Torn meniscus left knee  1996   TRANSURETHRAL RESECTION OF PROSTATE  1997    Home Medications:  Allergies as of 10/04/2023       Reactions   Diclofenac Sodium Anaphylaxis    Nabumetone Anaphylaxis   Nsaids Anaphylaxis   Tolmetin Anaphylaxis   Bee Venom Swelling   Desflurane Other (See Comments)   Malignant hyperthermia   Enflurane Other (See Comments)   Malignant hyperthermia   Halothane Other (See Comments)   Malignant hyperthermia   Isoflurane Other (See Comments)   Malignant hyperthermia   Sevoflurane Other (See Comments)   Malignant hyperthermia   Succinylcholine Other (See Comments)   Malignant hyperthermia        Medication List        Accurate as of October 04, 2023 12:05 PM. If you have any questions, ask your nurse or doctor.          Azelastine-Fluticasone 137-50 MCG/ACT Susp Place 1 spray into the nose daily.   Eliquis 5 MG Tabs tablet Generic drug: apixaban Take 5 mg by mouth 2 (two) times daily.   furosemide 40 MG tablet Commonly known as: LASIX Take 40 mg by mouth daily as needed.   HYDROcodone-acetaminophen 7.5-325 MG tablet Commonly known as: NORCO TAKE 1 TABLET EVERY 4 HOURS AS NEEDED FOR PAIN.   levocetirizine 5 MG tablet Commonly known as: XYZAL Take 5 mg by mouth daily.   lisinopril-hydrochlorothiazide 10-12.5 MG tablet Commonly known as: ZESTORETIC Take 1 tablet by mouth daily.   loteprednol 0.5 % ophthalmic suspension Commonly known as: LOTEMAX Place 1 drop  into the right eye at bedtime.   oxybutynin 10 MG 24 hr tablet Commonly known as: DITROPAN-XL Take 10 mg by mouth daily.   prednisoLONE acetate 1 % ophthalmic suspension Commonly known as: PRED FORTE Place 1 drop into the left eye 2 (two) times daily.   SYSTANE OP Apply 1 drop to eye 2 (two) times daily.   valACYclovir 1000 MG tablet Commonly known as: VALTREX Take 1,000 mg by mouth daily.   Vitamin D (Ergocalciferol) 1.25 MG (50000 UNIT) Caps capsule Commonly known as: DRISDOL Take by mouth.        Allergies:  Allergies  Allergen Reactions   Diclofenac Sodium Anaphylaxis   Nabumetone Anaphylaxis   Nsaids Anaphylaxis    Tolmetin Anaphylaxis   Bee Venom Swelling   Desflurane Other (See Comments)    Malignant hyperthermia   Enflurane Other (See Comments)    Malignant hyperthermia   Halothane Other (See Comments)    Malignant hyperthermia   Isoflurane Other (See Comments)    Malignant hyperthermia   Sevoflurane Other (See Comments)    Malignant hyperthermia   Succinylcholine Other (See Comments)    Malignant hyperthermia    Family History: Family History  Problem Relation Age of Onset   Prostate cancer Brother    Lung disease Brother        lobectomy    Social History:  reports that he has quit smoking. His smoking use included cigarettes. He has a 15 pack-year smoking history. He has never used smokeless tobacco. He reports that he does not drink alcohol and does not use drugs.  ROS: All other review of systems were reviewed and are negative except what is noted above in HPI  Physical Exam: BP 132/74   Pulse 97   Constitutional:  Alert and oriented, No acute distress. HEENT: [place] AT, moist mucus membranes.  Trachea midline, no masses. Cardiovascular: No clubbing, cyanosis, or edema. Respiratory: Normal respiratory effort, no increased work of breathing. GI: Abdomen is soft, nontender, nondistended, no abdominal masses GU: No CVA tenderness.  Lymph: No cervical or inguinal lymphadenopathy. Skin: No rashes, bruises or suspicious lesions. Neurologic: Grossly intact, no focal deficits, moving all 4 extremities. Psychiatric: Normal mood and affect.  Laboratory Data: Lab Results  Component Value Date   WBC 9.0 12/02/2022   HGB 15.4 12/02/2022   HCT 45.5 12/02/2022   MCV 97.8 12/02/2022   PLT 189 12/02/2022    Lab Results  Component Value Date   CREATININE 1.36 (H) 12/02/2022    No results found for: "PSA"  No results found for: "TESTOSTERONE"  Lab Results  Component Value Date   HGBA1C 5.8 07/02/2017    Urinalysis    Component Value Date/Time   COLORURINE YELLOW 05/13/2022  1535   APPEARANCEUR Clear 06/28/2023 1224   LABSPEC 1.009 05/13/2022 1535   PHURINE 6.0 05/13/2022 1535   GLUCOSEU Negative 06/28/2023 1224   HGBUR SMALL (A) 05/13/2022 1535   BILIRUBINUR Negative 06/28/2023 1224   KETONESUR NEGATIVE 05/13/2022 1535   PROTEINUR Negative 06/28/2023 1224   PROTEINUR NEGATIVE 05/13/2022 1535   UROBILINOGEN 0.2 09/25/2014 1525   NITRITE Negative 06/28/2023 1224   NITRITE NEGATIVE 05/13/2022 1535   LEUKOCYTESUR Trace (A) 06/28/2023 1224   LEUKOCYTESUR MODERATE (A) 05/13/2022 1535    Lab Results  Component Value Date   LABMICR See below: 06/28/2023   WBCUA 6-10 (A) 06/28/2023   LABEPIT 0-10 06/28/2023   BACTERIA Few 06/28/2023    Pertinent Imaging:  No results found for this or  any previous visit.  No results found for this or any previous visit.  No results found for this or any previous visit.  No results found for this or any previous visit.  No results found for this or any previous visit.  No valid procedures specified. No results found for this or any previous visit.  No results found for this or any previous visit.   Assessment & Plan:    1. Prostate cancer (HCC) Followup 3 months with PSA - Urinalysis, Routine w reflex microscopic  2. Urge incontinence Continue penile clamp   No follow-ups on file.  Wilkie Aye, MD  [facility] [place]

## 2023-10-11 DIAGNOSIS — N3941 Urge incontinence: Secondary | ICD-10-CM | POA: Diagnosis not present

## 2023-10-11 DIAGNOSIS — R350 Frequency of micturition: Secondary | ICD-10-CM | POA: Diagnosis not present

## 2023-10-16 ENCOUNTER — Encounter: Payer: Self-pay | Admitting: Orthopaedic Surgery

## 2023-10-16 ENCOUNTER — Ambulatory Visit: Payer: Medicare Other | Admitting: Orthopaedic Surgery

## 2023-10-16 VITALS — BP 138/79 | HR 80 | Ht 69.5 in | Wt 219.0 lb

## 2023-10-16 DIAGNOSIS — M25512 Pain in left shoulder: Secondary | ICD-10-CM

## 2023-10-16 DIAGNOSIS — G8929 Other chronic pain: Secondary | ICD-10-CM | POA: Diagnosis not present

## 2023-10-16 NOTE — Progress Notes (Signed)
I am OK  He is doing much better.  He has little shoulder pain on the left.  He is using his arm more.  His back is not hurting.  Encounter Diagnosis  Name Primary?   Chronic left shoulder pain Yes   I will see prn.  Call if any problem.  Precautions discussed.  Electronically Signed Darreld Mclean, MD 12/18/20241:24 PM

## 2023-11-15 DIAGNOSIS — R350 Frequency of micturition: Secondary | ICD-10-CM | POA: Diagnosis not present

## 2023-11-15 DIAGNOSIS — N3941 Urge incontinence: Secondary | ICD-10-CM | POA: Diagnosis not present

## 2023-12-13 DIAGNOSIS — N3941 Urge incontinence: Secondary | ICD-10-CM | POA: Diagnosis not present

## 2023-12-13 DIAGNOSIS — R350 Frequency of micturition: Secondary | ICD-10-CM | POA: Diagnosis not present

## 2024-01-03 ENCOUNTER — Other Ambulatory Visit: Payer: Medicare Other

## 2024-01-03 DIAGNOSIS — C61 Malignant neoplasm of prostate: Secondary | ICD-10-CM

## 2024-01-04 LAB — PSA: Prostate Specific Ag, Serum: 7.1 ng/mL — ABNORMAL HIGH (ref 0.0–4.0)

## 2024-01-08 ENCOUNTER — Encounter: Payer: Self-pay | Admitting: Orthopaedic Surgery

## 2024-01-08 ENCOUNTER — Ambulatory Visit (INDEPENDENT_AMBULATORY_CARE_PROVIDER_SITE_OTHER): Admitting: Orthopaedic Surgery

## 2024-01-08 DIAGNOSIS — G8929 Other chronic pain: Secondary | ICD-10-CM

## 2024-01-08 DIAGNOSIS — M25512 Pain in left shoulder: Secondary | ICD-10-CM | POA: Diagnosis not present

## 2024-01-08 MED ORDER — HYDROCODONE-ACETAMINOPHEN 7.5-325 MG PO TABS: ORAL_TABLET | ORAL | 0 refills | Status: AC

## 2024-01-08 NOTE — Progress Notes (Signed)
 PROCEDURE NOTE:  The patient request injection, verbal consent was obtained.  The left shoulder was prepped appropriately after time out was performed.   Sterile technique was observed and injection of 1 cc of DepoMedrol 40mg  with several cc's of plain xylocaine. Anesthesia was provided by ethyl chloride and a 20-gauge needle was used to inject the shoulder area. A posterior approach was used.  The injection was tolerated well.  A band aid dressing was applied.  The patient was advised to apply ice later today and tomorrow to the injection sight as needed.  Encounter Diagnosis  Name Primary?   Chronic left shoulder pain Yes   I will see prn.  He lifted lawnmower part and caused pain today.  I have reviewed the [place] Controlled Substance Reporting System web site prior to prescribing narcotic medicine for this patient.  Call if any problem.  Precautions discussed.  Electronically Signed Darreld Mclean, MD 3/12/20252:02 PM

## 2024-01-10 ENCOUNTER — Ambulatory Visit (INDEPENDENT_AMBULATORY_CARE_PROVIDER_SITE_OTHER): Payer: Medicare Other | Admitting: Urology

## 2024-01-10 VITALS — BP 121/60 | HR 57

## 2024-01-10 DIAGNOSIS — N3941 Urge incontinence: Secondary | ICD-10-CM

## 2024-01-10 DIAGNOSIS — R350 Frequency of micturition: Secondary | ICD-10-CM | POA: Diagnosis not present

## 2024-01-10 DIAGNOSIS — C61 Malignant neoplasm of prostate: Secondary | ICD-10-CM

## 2024-01-10 LAB — URINALYSIS, ROUTINE W REFLEX MICROSCOPIC
Bilirubin, UA: NEGATIVE
Glucose, UA: NEGATIVE
Ketones, UA: NEGATIVE
Leukocytes,UA: NEGATIVE
Nitrite, UA: NEGATIVE
Protein,UA: NEGATIVE
RBC, UA: NEGATIVE
Specific Gravity, UA: 1.02 (ref 1.005–1.030)
Urobilinogen, Ur: 0.2 mg/dL (ref 0.2–1.0)
pH, UA: 6.5 (ref 5.0–7.5)

## 2024-01-10 NOTE — Progress Notes (Signed)
 01/10/2024 11:22 AM   Tracy Pratt 09/02/1931 [id number]  Referring provider: Assunta Found, MD [address] [place],  [place] [address]  Followup prostate cancer   HPI: Tracy Pratt is a 89yo here for followup for prostate cancer and urge incontinence. PSA stable at 7.1. He uses the penile clamp for his incontinence. No worsening LUTS. No bone pain. No other complaints today   PMH: Past Medical History:  Diagnosis Date   Adrenal adenoma    Left   Arthritis    BPH (benign prostatic hyperplasia)    Cataract 2007, 2009   Bilateral   Colon polyps    DVT (deep venous thrombosis) (HCC)    Essential hypertension    Heart murmur    HX OF 30 YEARS AGO   Helicobacter pylori gastritis 09/09/2018   Malignant hyperthermia    Prostate cancer (HCC) 08/08/2007   Seed Implant and XRT   Spondylolisthesis    L5-S1    Surgical History: Past Surgical History:  Procedure Laterality Date   APPENDECTOMY  1940   BACK SURGERY  1979   BIOPSY  08/25/2018   Procedure: BIOPSY;  Surgeon: Iva Boop, MD;  [facility]: [facility];  Service: Endoscopy;;   CATARACT EXTRACTION  2007   COLONOSCOPY  2012   [facility]: single diminutive hyperplastic polyp in rectum   CORNEAL TRANSPLANT  2007   CRYOABLATION N/A 06/09/2021   Procedure: CRYO ABLATION PROSTATE;  Surgeon: Malen Gauze, MD;  Location: [facility];  Service: Urology;  Laterality: N/A;   ESOPHAGOGASTRODUODENOSCOPY (EGD) WITH PROPOFOL N/A 08/25/2018   Empiric dilation of esophagus, erythematous mucosa in antrum s/p biopsy, normal duodenum. Clotest positive.   KNEE SURGERY  1996   left knee arthoscopic   MALONEY DILATION  08/25/2018   Procedure: MALONEY DILATION;  Surgeon: Iva Boop, MD;  [facility];  Service: Endoscopy;;   ROTATOR CUFF REPAIR  2015   Torn meniscus left knee  1996   TRANSURETHRAL RESECTION OF PROSTATE  1997    Home Medications:  Allergies as of 01/10/2024       Reactions   Diclofenac  Sodium Anaphylaxis   Nabumetone Anaphylaxis   Nsaids Anaphylaxis   Tolmetin Anaphylaxis   Bee Venom Swelling   Desflurane Other (See Comments)   Malignant hyperthermia   Enflurane Other (See Comments)   Malignant hyperthermia   Halothane Other (See Comments)   Malignant hyperthermia   Isoflurane Other (See Comments)   Malignant hyperthermia   Sevoflurane Other (See Comments)   Malignant hyperthermia   Succinylcholine Other (See Comments)   Malignant hyperthermia        Medication List        Accurate as of January 10, 2024 11:22 AM. If you have any questions, ask your nurse or doctor.          Azelastine-Fluticasone 137-50 MCG/ACT Susp Place 1 spray into the nose daily.   Eliquis 5 MG Tabs tablet Generic drug: apixaban Take 5 mg by mouth 2 (two) times daily.   furosemide 40 MG tablet Commonly known as: LASIX Take 40 mg by mouth daily as needed.   HYDROcodone-acetaminophen 7.5-325 MG tablet Commonly known as: NORCO TAKE 1 TABLET EVERY 4 HOURS AS NEEDED FOR PAIN.   levocetirizine 5 MG tablet Commonly known as: XYZAL Take 5 mg by mouth daily.   lisinopril-hydrochlorothiazide 10-12.5 MG tablet Commonly known as: ZESTORETIC Take 1 tablet by mouth daily.   loteprednol 0.5 % ophthalmic suspension Commonly known as: LOTEMAX Place  1 drop into the right eye at bedtime.   oxybutynin 10 MG 24 hr tablet Commonly known as: DITROPAN-XL Take 10 mg by mouth daily.   prednisoLONE acetate 1 % ophthalmic suspension Commonly known as: PRED FORTE Place 1 drop into the left eye 2 (two) times daily.   SYSTANE OP Apply 1 drop to eye 2 (two) times daily.   valACYclovir 1000 MG tablet Commonly known as: VALTREX Take 1,000 mg by mouth daily.   Vitamin D (Ergocalciferol) 1.25 MG (50000 UNIT) Caps capsule Commonly known as: DRISDOL Take by mouth.        Allergies:  Allergies  Allergen Reactions   Diclofenac Sodium Anaphylaxis   Nabumetone Anaphylaxis   Nsaids  Anaphylaxis   Tolmetin Anaphylaxis   Bee Venom Swelling   Desflurane Other (See Comments)    Malignant hyperthermia   Enflurane Other (See Comments)    Malignant hyperthermia   Halothane Other (See Comments)    Malignant hyperthermia   Isoflurane Other (See Comments)    Malignant hyperthermia   Sevoflurane Other (See Comments)    Malignant hyperthermia   Succinylcholine Other (See Comments)    Malignant hyperthermia    Family History: Family History  Problem Relation Age of Onset   Prostate cancer Brother    Lung disease Brother        lobectomy    Social History:  reports that he has quit smoking. His smoking use included cigarettes. He has a 15 pack-year smoking history. He has never used smokeless tobacco. He reports that he does not drink alcohol and does not use drugs.  ROS: All other review of systems were reviewed and are negative except what is noted above in HPI  Physical Exam: BP 121/60   Pulse (!) 57   Constitutional:  Alert and oriented, No acute distress. HEENT: [place] AT, moist mucus membranes.  Trachea midline, no masses. Cardiovascular: No clubbing, cyanosis, or edema. Respiratory: Normal respiratory effort, no increased work of breathing. GI: Abdomen is soft, nontender, nondistended, no abdominal masses GU: No CVA tenderness.  Lymph: No cervical or inguinal lymphadenopathy. Skin: No rashes, bruises or suspicious lesions. Neurologic: Grossly intact, no focal deficits, moving all 4 extremities. Psychiatric: Normal mood and affect.  Laboratory Data: Lab Results  Component Value Date   WBC 9.0 12/02/2022   HGB 15.4 12/02/2022   HCT 45.5 12/02/2022   MCV 97.8 12/02/2022   PLT 189 12/02/2022    Lab Results  Component Value Date   CREATININE 1.36 (H) 12/02/2022    No results found for: "PSA"  No results found for: "TESTOSTERONE"  Lab Results  Component Value Date   HGBA1C 5.8 07/02/2017    Urinalysis    Component Value Date/Time    COLORURINE YELLOW 05/13/2022 1535   APPEARANCEUR Clear 10/04/2023 1203   LABSPEC 1.009 05/13/2022 1535   PHURINE 6.0 05/13/2022 1535   GLUCOSEU Negative 10/04/2023 1203   HGBUR SMALL (A) 05/13/2022 1535   BILIRUBINUR Negative 10/04/2023 1203   KETONESUR NEGATIVE 05/13/2022 1535   PROTEINUR Negative 10/04/2023 1203   PROTEINUR NEGATIVE 05/13/2022 1535   UROBILINOGEN 0.2 09/25/2014 1525   NITRITE Negative 10/04/2023 1203   NITRITE NEGATIVE 05/13/2022 1535   LEUKOCYTESUR Trace (A) 10/04/2023 1203   LEUKOCYTESUR MODERATE (A) 05/13/2022 1535    Lab Results  Component Value Date   LABMICR See below: 10/04/2023   WBCUA 0-5 10/04/2023   LABEPIT 0-10 10/04/2023   BACTERIA None seen 10/04/2023    Pertinent Imaging:  No results found  for this or any previous visit.  No results found for this or any previous visit.  No results found for this or any previous visit.  No results found for this or any previous visit.  No results found for this or any previous visit.  No results found for this or any previous visit.  No results found for this or any previous visit.  No results found for this or any previous visit.   Assessment & Plan:    1. Prostate cancer (HCC) (Primary) Followup 3 months with P{SA - Urinalysis, Routine w reflex microscopic  2. Urge incontinence Continue penile clamp   No follow-ups on file.  Wilkie Aye, MD  [facility] [place]

## 2024-01-19 ENCOUNTER — Encounter: Payer: Self-pay | Admitting: Urology

## 2024-01-19 NOTE — Patient Instructions (Signed)

## 2024-01-31 DIAGNOSIS — R350 Frequency of micturition: Secondary | ICD-10-CM | POA: Diagnosis not present

## 2024-01-31 DIAGNOSIS — N3941 Urge incontinence: Secondary | ICD-10-CM | POA: Diagnosis not present

## 2024-02-28 DIAGNOSIS — N3941 Urge incontinence: Secondary | ICD-10-CM | POA: Diagnosis not present

## 2024-02-28 DIAGNOSIS — R350 Frequency of micturition: Secondary | ICD-10-CM | POA: Diagnosis not present

## 2024-04-16 ENCOUNTER — Other Ambulatory Visit

## 2024-04-16 DIAGNOSIS — C61 Malignant neoplasm of prostate: Secondary | ICD-10-CM | POA: Diagnosis not present

## 2024-04-17 LAB — PSA: Prostate Specific Ag, Serum: 7.4 ng/mL — ABNORMAL HIGH (ref 0.0–4.0)

## 2024-04-21 ENCOUNTER — Ambulatory Visit: Payer: Self-pay | Admitting: Urology

## 2024-04-29 ENCOUNTER — Ambulatory Visit: Admitting: Urology

## 2024-04-29 ENCOUNTER — Encounter: Payer: Self-pay | Admitting: Urology

## 2024-04-29 VITALS — BP 109/65 | HR 68

## 2024-04-29 DIAGNOSIS — C61 Malignant neoplasm of prostate: Secondary | ICD-10-CM

## 2024-04-29 NOTE — Progress Notes (Signed)
 04/29/2024 11:50 AM   Tracy Pratt 02/05/1931 [id number]  Referring provider: Marvine Rush, MD [address] [place],  [place] [address]  Followup prostate cancer   HPI: [name] is a 89yo here for followup for prostate cancer. PSA increased to 7.4 from 7.1. He uses a penile clamp for his incontinence. He has occasional urge incontinence. He has intermittent pelvic/back pain which he took a norco which improved his pain. He takes 1 norco per day.    PMH: Past Medical History:  Diagnosis Date   Adrenal adenoma    Left   Arthritis    BPH (benign prostatic hyperplasia)    Cataract 2007, 2009   Bilateral   Colon polyps    DVT (deep venous thrombosis) (HCC)    Essential hypertension    Heart murmur    HX OF 30 YEARS AGO   Helicobacter pylori gastritis 09/09/2018   Malignant hyperthermia    Prostate cancer (HCC) 08/08/2007   Seed Implant and XRT   Spondylolisthesis    L5-S1    Surgical History: Past Surgical History:  Procedure Laterality Date   APPENDECTOMY  1940   BACK SURGERY  1979   BIOPSY  08/25/2018   Procedure: BIOPSY;  Surgeon: Avram Lupita BRAVO, MD;  [facility];  Service: Endoscopy;;   CATARACT EXTRACTION  2007   COLONOSCOPY  2012   [facility]: single diminutive hyperplastic polyp in rectum   CORNEAL TRANSPLANT  2007   CRYOABLATION N/A 06/09/2021   Procedure: CRYO ABLATION PROSTATE;  Surgeon: Sherrilee Belvie CROME, MD;  [facility];  Service: Urology;  Laterality: N/A;   ESOPHAGOGASTRODUODENOSCOPY (EGD) WITH PROPOFOL  N/A 08/25/2018   Empiric dilation of esophagus, erythematous mucosa in antrum s/p biopsy, normal duodenum. Clotest positive.   KNEE SURGERY  1996   left knee arthoscopic   MALONEY DILATION  08/25/2018   Procedure: MALONEY DILATION;  Surgeon: Avram Lupita BRAVO, MD;  [facility];  Service: Endoscopy;;   ROTATOR CUFF REPAIR  2015   Torn meniscus left knee  1996   TRANSURETHRAL RESECTION OF PROSTATE  1997     Home Medications:  Allergies as of 04/29/2024       Reactions   Diclofenac Sodium Anaphylaxis   Nabumetone Anaphylaxis   Nsaids Anaphylaxis   Tolmetin Anaphylaxis   Bee Venom Swelling   Desflurane Other (See Comments)   Malignant hyperthermia   Enflurane Other (See Comments)   Malignant hyperthermia   Halothane Other (See Comments)   Malignant hyperthermia   Isoflurane Other (See Comments)   Malignant hyperthermia   Sevoflurane Other (See Comments)   Malignant hyperthermia   Succinylcholine Other (See Comments)   Malignant hyperthermia        Medication List        Accurate as of April 29, 2024 11:50 AM. If you have any questions, ask your nurse or doctor.          Azelastine -Fluticasone  137-50 MCG/ACT Susp Place 1 spray into the nose daily.   Eliquis  5 MG Tabs tablet Generic drug: apixaban  Take 5 mg by mouth 2 (two) times daily.   furosemide  40 MG tablet Commonly known as: LASIX  Take 40 mg by mouth daily as needed.   HYDROcodone -acetaminophen  7.5-325 MG tablet Commonly known as: NORCO TAKE 1 TABLET EVERY 4 HOURS AS NEEDED FOR PAIN.   levocetirizine 5 MG tablet Commonly known as: XYZAL Take 5 mg by mouth daily.   lisinopril -hydrochlorothiazide 10-12.5 MG tablet Commonly known as: ZESTORETIC Take 1 tablet  by mouth daily.   loteprednol  0.5 % ophthalmic suspension Commonly known as: LOTEMAX  Place 1 drop into the right eye at bedtime.   oxybutynin  10 MG 24 hr tablet Commonly known as: DITROPAN -XL Take 10 mg by mouth daily.   prednisoLONE  acetate 1 % ophthalmic suspension Commonly known as: PRED FORTE  Place 1 drop into the left eye 2 (two) times daily.   SYSTANE OP Apply 1 drop to eye 2 (two) times daily.   valACYclovir  1000 MG tablet Commonly known as: VALTREX  Take 1,000 mg by mouth daily.   Vitamin D (Ergocalciferol) 1.25 MG (50000 UNIT) Caps capsule Commonly known as: DRISDOL Take by mouth.        Allergies:  Allergies  Allergen  Reactions   Diclofenac Sodium Anaphylaxis   Nabumetone Anaphylaxis   Nsaids Anaphylaxis   Tolmetin Anaphylaxis   Bee Venom Swelling   Desflurane Other (See Comments)    Malignant hyperthermia   Enflurane Other (See Comments)    Malignant hyperthermia   Halothane Other (See Comments)    Malignant hyperthermia   Isoflurane Other (See Comments)    Malignant hyperthermia   Sevoflurane Other (See Comments)    Malignant hyperthermia   Succinylcholine Other (See Comments)    Malignant hyperthermia    Family History: Family History  Problem Relation Age of Onset   Prostate cancer Brother    Lung disease Brother        lobectomy    Social History:  reports that he has quit smoking. His smoking use included cigarettes. He has a 15 pack-year smoking history. He has never used smokeless tobacco. He reports that he does not drink alcohol and does not use drugs.  ROS: All other review of systems were reviewed and are negative except what is noted above in HPI  Physical Exam: BP 109/65   Pulse 68   Constitutional:  Alert and oriented, No acute distress. HEENT: [place] AT, moist mucus membranes.  Trachea midline, no masses. Cardiovascular: No clubbing, cyanosis, or edema. Respiratory: Normal respiratory effort, no increased work of breathing. GI: Abdomen is soft, nontender, nondistended, no abdominal masses GU: No CVA tenderness.  Lymph: No cervical or inguinal lymphadenopathy. Skin: No rashes, bruises or suspicious lesions. Neurologic: Grossly intact, no focal deficits, moving all 4 extremities. Psychiatric: Normal mood and affect.  Laboratory Data: Lab Results  Component Value Date   WBC 9.0 12/02/2022   HGB 15.4 12/02/2022   HCT 45.5 12/02/2022   MCV 97.8 12/02/2022   PLT 189 12/02/2022    Lab Results  Component Value Date   CREATININE 1.36 (H) 12/02/2022    No results found for: PSA  No results found for: TESTOSTERONE  Lab Results  Component Value Date   HGBA1C  5.8 07/02/2017    Urinalysis    Component Value Date/Time   COLORURINE YELLOW 05/13/2022 1535   APPEARANCEUR Clear 01/10/2024 1112   LABSPEC 1.009 05/13/2022 1535   PHURINE 6.0 05/13/2022 1535   GLUCOSEU Negative 01/10/2024 1112   HGBUR SMALL (A) 05/13/2022 1535   BILIRUBINUR Negative 01/10/2024 1112   KETONESUR NEGATIVE 05/13/2022 1535   PROTEINUR Negative 01/10/2024 1112   PROTEINUR NEGATIVE 05/13/2022 1535   UROBILINOGEN 0.2 09/25/2014 1525   NITRITE Negative 01/10/2024 1112   NITRITE NEGATIVE 05/13/2022 1535   LEUKOCYTESUR Negative 01/10/2024 1112   LEUKOCYTESUR MODERATE (A) 05/13/2022 1535    Lab Results  Component Value Date   LABMICR Comment 01/10/2024   WBCUA 0-5 10/04/2023   LABEPIT 0-10 10/04/2023   BACTERIA  None seen 10/04/2023    Pertinent Imaging:  No results found for this or any previous visit.  No results found for this or any previous visit.  No results found for this or any previous visit.  No results found for this or any previous visit.  No results found for this or any previous visit.  No results found for this or any previous visit.  No results found for this or any previous visit.  No results found for this or any previous visit.   Assessment & Plan:    1. Prostate cancer (HCC) (Primary) Followup 6 months with a PSA   No follow-ups on file.  Belvie Clara, MD  [facility] [place]

## 2024-04-29 NOTE — Patient Instructions (Signed)

## 2024-05-13 ENCOUNTER — Ambulatory Visit: Admitting: Orthopaedic Surgery

## 2024-05-13 ENCOUNTER — Encounter: Payer: Self-pay | Admitting: Orthopaedic Surgery

## 2024-05-13 DIAGNOSIS — M545 Low back pain, unspecified: Secondary | ICD-10-CM

## 2024-05-13 MED ORDER — HYDROCODONE-ACETAMINOPHEN 7.5-325 MG PO TABS
1.0000 | ORAL_TABLET | Freq: Four times a day (QID) | ORAL | 0 refills | Status: AC | PRN
Start: 2024-05-13 — End: ?

## 2024-05-13 NOTE — Progress Notes (Signed)
 I need more medicine  He has more lower back pain and needs a few more pain pills.  They help but he is running out of them.  He also asked about a back brace.  I suggested he go to the [facility] and see what braces they have and if he can find one he likes.  I do not normally Rx back braces but he has done well in the distant past with one and he can try it again. I will give Rx if he gives me the name of the brace.  Lower back is tender, ROM is good but decreased, muscle tone and strength normal, NV intact.  Encounter Diagnosis  Name Primary?   Lumbar pain Yes   I have reviewed the [place]  Controlled Substance Reporting System web site prior to prescribing narcotic medicine for this patient.  Return prn.  Call if any problem.  Precautions discussed.  Electronically Signed Lemond Stable, MD 7/16/20251:48 PM

## 2024-06-01 ENCOUNTER — Telehealth: Payer: Self-pay | Admitting: *Deleted

## 2024-06-01 NOTE — Telephone Encounter (Signed)
 noted

## 2024-06-01 NOTE — Telephone Encounter (Signed)
-----   Message from Nurse Tammy L sent at 05/13/2024  3:56 PM EDT ----- Regarding: pain medicine Hey, this patient came in and talked with Dr. Brenna about his pain medicine. He is now having to take more so DR. Brenna wanted you to be aware if he calls and his insurance requires him to do a weekly rx for his pain meds just let DR. Brenna know and he will change it.Thanks, [employer]

## 2024-06-19 ENCOUNTER — Encounter: Payer: Self-pay | Admitting: Radiology

## 2024-07-09 DIAGNOSIS — R739 Hyperglycemia, unspecified: Secondary | ICD-10-CM | POA: Diagnosis not present

## 2024-07-09 DIAGNOSIS — Z6830 Body mass index (BMI) 30.0-30.9, adult: Secondary | ICD-10-CM | POA: Diagnosis not present

## 2024-07-09 DIAGNOSIS — I10 Essential (primary) hypertension: Secondary | ICD-10-CM | POA: Diagnosis not present

## 2024-07-09 DIAGNOSIS — R7309 Other abnormal glucose: Secondary | ICD-10-CM | POA: Diagnosis not present

## 2024-07-09 DIAGNOSIS — E785 Hyperlipidemia, unspecified: Secondary | ICD-10-CM | POA: Diagnosis not present

## 2024-07-09 DIAGNOSIS — E6609 Other obesity due to excess calories: Secondary | ICD-10-CM | POA: Diagnosis not present

## 2024-07-09 DIAGNOSIS — E559 Vitamin D deficiency, unspecified: Secondary | ICD-10-CM | POA: Diagnosis not present

## 2024-07-09 DIAGNOSIS — R339 Retention of urine, unspecified: Secondary | ICD-10-CM | POA: Diagnosis not present

## 2024-07-09 DIAGNOSIS — R946 Abnormal results of thyroid function studies: Secondary | ICD-10-CM | POA: Diagnosis not present

## 2024-08-04 ENCOUNTER — Encounter: Payer: Self-pay | Admitting: Orthopedic Surgery

## 2024-08-04 ENCOUNTER — Ambulatory Visit (INDEPENDENT_AMBULATORY_CARE_PROVIDER_SITE_OTHER): Admitting: Orthopedic Surgery

## 2024-08-04 VITALS — BP 165/82 | HR 76 | Ht 69.5 in | Wt 214.0 lb

## 2024-08-04 DIAGNOSIS — M25512 Pain in left shoulder: Secondary | ICD-10-CM

## 2024-08-04 DIAGNOSIS — G8929 Other chronic pain: Secondary | ICD-10-CM

## 2024-08-04 NOTE — Progress Notes (Signed)
 New Patient Visit  Assessment: Tracy Pratt is a 89 y.o. male with the following: 1. Chronic left shoulder pain  Plan: Draco Malczewski has pain across the left part of his chest wall, and and into the axilla.  He also has pain in the trapezius, which she describes as a spasm type pain.  He has good range of motion of the left shoulder.  He has good strength.  There is no injury.  No fall.  No stumble.  He did not impact the chest or shoulder against anything.  Description of the pain, current presentation is most consistent with soft tissue potentially muscle strain.  I think and he will continue to get better.  If his chest discomfort worsens, he should be evaluated by his PCP.  He has been taking hydrocodone  intermittently for years.  His last prescription was by Dr. Marvine less than a month ago.  We have advised him to continue to follow-up with Dr. Marvine for further prescriptions.  Follow-up: Return if symptoms worsen or fail to improve.  Subjective:  Chief Complaint  Patient presents with   Chest Pain    Across chest since Friday goes into back / no fall but states he got tangled in his jacket and  stumbled some / states he started taking the Hydrocodone  Dr Brenna gave him every 4 hours / Dr Marvine is going to take over his Hydrocodone , gave to him last month     History of Present Illness: Tracy Pratt is a 89 y.o. male who presents for evaluation of left chest and shoulder pain.  A few days ago, he states he got tangled up in his jacket and started to have pain in the left portion of his chest and into the axilla.  He did not fall.  He did not impact his chest.  He states that he will occasionally have sharp pains which is severe.  He also noted some spasm in the left trapezius, which is also intermittent.  He does have a history of left shoulder rotator cuff repair, and has done very well.  He has been taking hydrocodone  as needed for years.  He continues to work,  Clinical cytogeneticist and [employer]   Review of Systems: No fevers or chills No numbness or tingling No chest pain No shortness of breath No bowel or bladder dysfunction No GI distress No headaches   Medical History:  Past Medical History:  Diagnosis Date   Adrenal adenoma    Left   Arthritis    BPH (benign prostatic hyperplasia)    Cataract 2007, 2009   Bilateral   Colon polyps    DVT (deep venous thrombosis) (HCC)    Essential hypertension    Heart murmur    HX OF 30 YEARS AGO   Helicobacter pylori gastritis 09/09/2018   Malignant hyperthermia    Prostate cancer (HCC) 08/08/2007   Seed Implant and XRT   Spondylolisthesis    L5-S1    Past Surgical History:  Procedure Laterality Date   APPENDECTOMY  1940   BACK SURGERY  1979   BIOPSY  08/25/2018   Procedure: BIOPSY;  Surgeon: Avram Lupita BRAVO, MD;  [facility];  Service: Endoscopy;;   CATARACT EXTRACTION  2007   COLONOSCOPY  2012   [facility]: single diminutive hyperplastic polyp in rectum   CORNEAL TRANSPLANT  2007   CRYOABLATION N/A 06/09/2021   Procedure: CRYO ABLATION PROSTATE;  Surgeon: Sherrilee Belvie CROME, MD;  [facility];  Service: Urology;  Laterality: N/A;   ESOPHAGOGASTRODUODENOSCOPY (EGD) WITH PROPOFOL  N/A 08/25/2018   Empiric dilation of esophagus, erythematous mucosa in antrum s/p biopsy, normal duodenum. Clotest positive.   KNEE SURGERY  1996   left knee arthoscopic   MALONEY DILATION  08/25/2018   Procedure: MALONEY DILATION;  Surgeon: Avram Lupita BRAVO, MD;  [facility];  Service: Endoscopy;;   ROTATOR CUFF REPAIR  2015   Torn meniscus left knee  1996   TRANSURETHRAL RESECTION OF PROSTATE  1997    Family History  Problem Relation Age of Onset   Prostate cancer Brother    Lung disease Brother        lobectomy   Social History   Tobacco Use   Smoking status: Former    Current packs/day: 1.00    Average packs/day: 1 pack/day for 15.0 years (15.0 ttl  pk-yrs)    Types: Cigarettes   Smokeless tobacco: Never   Tobacco comments:    quit 28 years ago  Vaping Use   Vaping status: Never Used  Substance Use Topics   Alcohol use: No   Drug use: No    Allergies  Allergen Reactions   Diclofenac Sodium Anaphylaxis   Nabumetone Anaphylaxis   Nsaids Anaphylaxis   Tolmetin Anaphylaxis   Bee Venom Swelling   Desflurane Other (See Comments)    Malignant hyperthermia   Enflurane Other (See Comments)    Malignant hyperthermia   Halothane Other (See Comments)    Malignant hyperthermia   Isoflurane Other (See Comments)    Malignant hyperthermia   Sevoflurane Other (See Comments)    Malignant hyperthermia   Succinylcholine Other (See Comments)    Malignant hyperthermia    Current Meds  Medication Sig   Azelastine -Fluticasone  137-50 MCG/ACT SUSP Place 1 spray into the nose daily.   ELIQUIS  5 MG TABS tablet Take 5 mg by mouth 2 (two) times daily.   furosemide  (LASIX ) 40 MG tablet Take 40 mg by mouth daily as needed.   HYDROcodone -acetaminophen  (NORCO) 7.5-325 MG tablet TAKE 1 TABLET EVERY 4 HOURS AS NEEDED FOR PAIN.   HYDROcodone -acetaminophen  (NORCO) 7.5-325 MG tablet Take 1 tablet by mouth every 6 (six) hours as needed for moderate pain (pain score 4-6).   levocetirizine (XYZAL) 5 MG tablet Take 5 mg by mouth daily.   levothyroxine (SYNTHROID) 25 MCG tablet Take 25 mcg by mouth every morning.   lisinopril -hydrochlorothiazide (PRINZIDE,ZESTORETIC) 10-12.5 MG tablet Take 1 tablet by mouth daily.   loteprednol  (LOTEMAX ) 0.5 % ophthalmic suspension Place 1 drop into the right eye at bedtime.   oxybutynin  (DITROPAN -XL) 10 MG 24 hr tablet Take 10 mg by mouth daily.   Polyethyl Glycol-Propyl Glycol (SYSTANE OP) Apply 1 drop to eye 2 (two) times daily.   prednisoLONE  acetate (PRED FORTE ) 1 % ophthalmic suspension Place 1 drop into the left eye 2 (two) times daily.   valACYclovir  (VALTREX ) 1000 MG tablet Take 1,000 mg by mouth daily.   Vitamin  D, Ergocalciferol, (DRISDOL) 1.25 MG (50000 UNIT) CAPS capsule Take by mouth.    Objective: BP (!) 165/82   Pulse 76   Ht 5' 9.5 (1.765 m)   Wt 214 lb (97.1 kg)   BMI 31.15 kg/m   Physical Exam:  General: Elderly male., Alert and oriented., and No acute distress. Gait: Normal gait.  Left shoulder without deformity.  Well-healed surgical incisions.  He has good range of motion of the left shoulder.  No bruising or swelling is appreciated in  the left chest or into the axilla.  No point tenderness.  Fingers are warm and well-perfused.  IMAGING: No new imaging obtained today   New Medications:  No orders of the defined types were placed in this encounter.     Oneil DELENA Horde, MD  08/04/2024 2:30 PM

## 2024-08-31 ENCOUNTER — Encounter: Payer: Self-pay | Admitting: Radiology

## 2024-09-10 DIAGNOSIS — E039 Hypothyroidism, unspecified: Secondary | ICD-10-CM | POA: Diagnosis not present

## 2024-09-10 DIAGNOSIS — Z23 Encounter for immunization: Secondary | ICD-10-CM | POA: Diagnosis not present

## 2024-09-10 DIAGNOSIS — R1031 Right lower quadrant pain: Secondary | ICD-10-CM | POA: Diagnosis not present

## 2024-09-10 DIAGNOSIS — Z0000 Encounter for general adult medical examination without abnormal findings: Secondary | ICD-10-CM | POA: Diagnosis not present

## 2024-09-11 ENCOUNTER — Other Ambulatory Visit (HOSPITAL_COMMUNITY): Payer: Self-pay | Admitting: Family Medicine

## 2024-09-11 DIAGNOSIS — R1031 Right lower quadrant pain: Secondary | ICD-10-CM

## 2024-09-28 DIAGNOSIS — Z947 Corneal transplant status: Secondary | ICD-10-CM | POA: Diagnosis not present

## 2024-10-20 ENCOUNTER — Ambulatory Visit (HOSPITAL_COMMUNITY)
Admission: RE | Admit: 2024-10-20 | Discharge: 2024-10-20 | Disposition: A | Discharge status: Home / Self Care | Source: Ambulatory Visit | Attending: Family Medicine | Admitting: Family Medicine

## 2024-10-20 DIAGNOSIS — R1031 Right lower quadrant pain: Secondary | ICD-10-CM | POA: Diagnosis present

## 2024-10-20 MED ORDER — IOHEXOL 300 MG/ML  SOLN
100.0000 mL | Freq: Once | INTRAMUSCULAR | Status: AC | PRN
  Administered 2024-10-20: 100 mL via INTRAVENOUS

## 2024-11-02 ENCOUNTER — Other Ambulatory Visit

## 2024-11-02 DIAGNOSIS — C61 Malignant neoplasm of prostate: Secondary | ICD-10-CM

## 2024-11-03 LAB — PSA: Prostate Specific Ag, Serum: 9.8 ng/mL — ABNORMAL HIGH (ref 0.0–4.0)

## 2024-11-06 ENCOUNTER — Ambulatory Visit: Admitting: Urology

## 2024-11-06 ENCOUNTER — Encounter: Payer: Self-pay | Admitting: Urology

## 2024-11-06 VITALS — BP 100/62 | HR 97

## 2024-11-06 DIAGNOSIS — C61 Malignant neoplasm of prostate: Secondary | ICD-10-CM

## 2024-11-06 DIAGNOSIS — N3941 Urge incontinence: Secondary | ICD-10-CM | POA: Diagnosis not present

## 2024-11-06 LAB — URINALYSIS, ROUTINE W REFLEX MICROSCOPIC
Bilirubin, UA: NEGATIVE
Glucose, UA: NEGATIVE
Ketones, UA: NEGATIVE
Leukocytes,UA: NEGATIVE
Nitrite, UA: NEGATIVE
Protein,UA: NEGATIVE
RBC, UA: NEGATIVE
Specific Gravity, UA: <1.005 — ABNORMAL LOW (ref 1.005–1.030)
Urobilinogen, Ur: 0.2 mg/dL (ref 0.2–1.0)
pH, UA: 6 (ref 5.0–7.5)

## 2024-11-06 NOTE — Progress Notes (Unsigned)
 "  11/06/2024 11:23 AM   Tracy Pratt Jun 26, 1931 [id number]  Referring provider: Marvine Rush, MD [address] [place],  [place] [address]  Followup prostate cancer.    HPI: Tracy Pratt is a 89yo here for followup for prostate cancer and urge incontinence. PSA increased to 9.8 from 7.4. He underwent CT in 09/2024 showed no evidence of metastatic disease. He had intermittent right testicular pain and now his testicle has decreased in size.    PMH: Past Medical History:  Diagnosis Date   Adrenal adenoma    Left   Arthritis    BPH (benign prostatic hyperplasia)    Cataract 2007, 2009   Bilateral   Colon polyps    DVT (deep venous thrombosis) (HCC)    Essential hypertension    Heart murmur    HX OF 30 YEARS AGO   Helicobacter pylori gastritis 09/09/2018   Malignant hyperthermia    Prostate cancer (HCC) 08/08/2007   Seed Implant and XRT   Spondylolisthesis    L5-S1    Surgical History: Past Surgical History:  Procedure Laterality Date   APPENDECTOMY  1940   BACK SURGERY  1979   BIOPSY  08/25/2018   Procedure: BIOPSY;  Surgeon: Avram Lupita BRAVO, MD;  Location: [facility];  Service: Endoscopy;;   CATARACT EXTRACTION  2007   COLONOSCOPY  2012   [facility]: single diminutive hyperplastic polyp in rectum   CORNEAL TRANSPLANT  2007   CRYOABLATION N/A 06/09/2021   Procedure: CRYO ABLATION PROSTATE;  Surgeon: Sherrilee Belvie CROME, MD;  Location: [facility];  Service: Urology;  Laterality: N/A;   ESOPHAGOGASTRODUODENOSCOPY (EGD) WITH PROPOFOL  N/A 08/25/2018   Empiric dilation of esophagus, erythematous mucosa in antrum s/p biopsy, normal duodenum. Clotest positive.   KNEE SURGERY  1996   left knee arthoscopic   MALONEY DILATION  08/25/2018   Procedure: MALONEY DILATION;  Surgeon: Avram Lupita BRAVO, MD;  Location: [facility];  Service: Endoscopy;;   ROTATOR CUFF REPAIR  2015   Torn meniscus left knee  1996   TRANSURETHRAL RESECTION OF PROSTATE  1997    Home Medications:   Allergies as of 11/06/2024       Reactions   Diclofenac Sodium Anaphylaxis   Nabumetone Anaphylaxis   Nsaids Anaphylaxis   Tolmetin Anaphylaxis   Bee Venom Swelling   Desflurane Other (See Comments)   Malignant hyperthermia   Enflurane Other (See Comments)   Malignant hyperthermia   Halothane Other (See Comments)   Malignant hyperthermia   Isoflurane Other (See Comments)   Malignant hyperthermia   Sevoflurane Other (See Comments)   Malignant hyperthermia   Succinylcholine Other (See Comments)   Malignant hyperthermia        Medication List        Accurate as of November 06, 2024 11:23 AM. If you have any questions, ask your nurse or doctor.          Azelastine -Fluticasone  137-50 MCG/ACT Susp Place 1 spray into the nose daily.   Eliquis  5 MG Tabs tablet Generic drug: apixaban  Take 5 mg by mouth 2 (two) times daily.   furosemide  40 MG tablet Commonly known as: LASIX  Take 40 mg by mouth daily as needed.   HYDROcodone -acetaminophen  7.5-325 MG tablet Commonly known as: NORCO TAKE 1 TABLET EVERY 4 HOURS AS NEEDED FOR PAIN.   HYDROcodone -acetaminophen  7.5-325 MG tablet Commonly known as: NORCO Take 1 tablet by mouth every 6 (six) hours as needed for moderate pain (pain score 4-6).  levocetirizine 5 MG tablet Commonly known as: XYZAL Take 5 mg by mouth daily.   levothyroxine 25 MCG tablet Commonly known as: SYNTHROID Take 25 mcg by mouth every morning.   lisinopril -hydrochlorothiazide 10-12.5 MG tablet Commonly known as: ZESTORETIC Take 1 tablet by mouth daily.   loteprednol  0.5 % ophthalmic suspension Commonly known as: LOTEMAX  Place 1 drop into the right eye at bedtime.   oxybutynin  10 MG 24 hr tablet Commonly known as: DITROPAN -XL Take 10 mg by mouth daily.   prednisoLONE  acetate 1 % ophthalmic suspension Commonly known as: PRED FORTE  Place 1 drop into the left eye 2 (two) times daily.   SYSTANE OP Apply 1 drop to eye 2 (two) times daily.    valACYclovir  1000 MG tablet Commonly known as: VALTREX  Take 1,000 mg by mouth daily.   Vitamin D (Ergocalciferol) 1.25 MG (50000 UNIT) Caps capsule Commonly known as: DRISDOL Take by mouth.        Allergies: Allergies[1]  Family History: Family History  Problem Relation Age of Onset   Prostate cancer Brother    Lung disease Brother        lobectomy    Social History:  reports that he has quit smoking. His smoking use included cigarettes. He has a 15 pack-year smoking history. He has never used smokeless tobacco. He reports that he does not drink alcohol and does not use drugs.  ROS: All other review of systems were reviewed and are negative except what is noted above in HPI  Physical Exam: BP 100/62   Pulse 97   Constitutional:  Alert and oriented, No acute distress. HEENT: [place] AT, moist mucus membranes.  Trachea midline, no masses. Cardiovascular: No clubbing, cyanosis, or edema. Respiratory: Normal respiratory effort, no increased work of breathing. GI: Abdomen is soft, nontender, nondistended, no abdominal masses GU: No CVA tenderness.  Lymph: No cervical or inguinal lymphadenopathy. Skin: No rashes, bruises or suspicious lesions. Neurologic: Grossly intact, no focal deficits, moving all 4 extremities. Psychiatric: Normal mood and affect.  Laboratory Data: Lab Results  Component Value Date   WBC 9.0 12/02/2022   HGB 15.4 12/02/2022   HCT 45.5 12/02/2022   MCV 97.8 12/02/2022   PLT 189 12/02/2022    Lab Results  Component Value Date   CREATININE 1.36 (H) 12/02/2022    No results found for: PSA  No results found for: TESTOSTERONE  Lab Results  Component Value Date   HGBA1C 5.8 07/02/2017    Urinalysis    Component Value Date/Time   COLORURINE YELLOW 05/13/2022 1535   APPEARANCEUR Clear 01/10/2024 1112   LABSPEC 1.009 05/13/2022 1535   PHURINE 6.0 05/13/2022 1535   GLUCOSEU Negative 01/10/2024 1112   HGBUR SMALL (A) 05/13/2022 1535    BILIRUBINUR Negative 01/10/2024 1112   KETONESUR NEGATIVE 05/13/2022 1535   PROTEINUR Negative 01/10/2024 1112   PROTEINUR NEGATIVE 05/13/2022 1535   UROBILINOGEN 0.2 09/25/2014 1525   NITRITE Negative 01/10/2024 1112   NITRITE NEGATIVE 05/13/2022 1535   LEUKOCYTESUR Negative 01/10/2024 1112   LEUKOCYTESUR MODERATE (A) 05/13/2022 1535    Lab Results  Component Value Date   LABMICR Comment 01/10/2024   WBCUA 0-5 10/04/2023   LABEPIT 0-10 10/04/2023   BACTERIA None seen 10/04/2023    Pertinent Imaging: *** No results found for this or any previous visit.  No results found for this or any previous visit.  No results found for this or any previous visit.  No results found for this or any previous visit.  No  results found for this or any previous visit.  No results found for this or any previous visit.  No results found for this or any previous visit.  No results found for this or any previous visit.   Assessment & Plan:    1. Prostate cancer (HCC) (Primary) Continue surveillance. Followup 6 months with PSA - Urinalysis, Routine w reflex microscopic  2. Urge incontinence Continue cunningham clamp   No follow-ups on file.  Belvie Clara, MD  [facility] [place]      [1]  Allergies Allergen Reactions   Diclofenac Sodium Anaphylaxis   Nabumetone Anaphylaxis   Nsaids Anaphylaxis   Tolmetin Anaphylaxis   Bee Venom Swelling   Desflurane Other (See Comments)    Malignant hyperthermia   Enflurane Other (See Comments)    Malignant hyperthermia   Halothane Other (See Comments)    Malignant hyperthermia   Isoflurane Other (See Comments)    Malignant hyperthermia   Sevoflurane Other (See Comments)    Malignant hyperthermia   Succinylcholine Other (See Comments)    Malignant hyperthermia   "

## 2024-11-06 NOTE — Patient Instructions (Signed)

## 2025-05-18 ENCOUNTER — Other Ambulatory Visit

## 2025-05-28 ENCOUNTER — Ambulatory Visit: Admitting: Urology
